# Patient Record
Sex: Female | Born: 1965 | Race: White | Hispanic: No | Marital: Married | Smoking: Never smoker
Health system: Southern US, Community
[De-identification: ages and names within clinical notes are randomized; demographics above are authoritative.]

## PROBLEM LIST (undated history)

## (undated) DIAGNOSIS — K219 Gastro-esophageal reflux disease without esophagitis: Secondary | ICD-10-CM

## (undated) DIAGNOSIS — A048 Other specified bacterial intestinal infections: Secondary | ICD-10-CM

## (undated) DIAGNOSIS — C801 Malignant (primary) neoplasm, unspecified: Secondary | ICD-10-CM

## (undated) DIAGNOSIS — E785 Hyperlipidemia, unspecified: Secondary | ICD-10-CM

## (undated) DIAGNOSIS — M1990 Unspecified osteoarthritis, unspecified site: Secondary | ICD-10-CM

## (undated) DIAGNOSIS — E0590 Thyrotoxicosis, unspecified without thyrotoxic crisis or storm: Secondary | ICD-10-CM

## (undated) DIAGNOSIS — D649 Anemia, unspecified: Secondary | ICD-10-CM

## (undated) DIAGNOSIS — F419 Anxiety disorder, unspecified: Secondary | ICD-10-CM

## (undated) DIAGNOSIS — N96 Recurrent pregnancy loss: Secondary | ICD-10-CM

## (undated) DIAGNOSIS — Z9289 Personal history of other medical treatment: Secondary | ICD-10-CM

## (undated) HISTORY — DX: Unspecified osteoarthritis, unspecified site: M19.90

## (undated) HISTORY — DX: Thyrotoxicosis, unspecified without thyrotoxic crisis or storm: E05.90

## (undated) HISTORY — DX: Personal history of other medical treatment: Z92.89

## (undated) HISTORY — DX: Recurrent pregnancy loss: N96

## (undated) HISTORY — DX: Hyperlipidemia, unspecified: E78.5

## (undated) HISTORY — DX: Gastro-esophageal reflux disease without esophagitis: K21.9

## (undated) HISTORY — DX: Other specified bacterial intestinal infections: A04.8

## (undated) HISTORY — DX: Anxiety disorder, unspecified: F41.9

## (undated) HISTORY — PX: ESSURE TUBAL LIGATION: SUR464

---

## 2003-11-21 ENCOUNTER — Encounter (INDEPENDENT_AMBULATORY_CARE_PROVIDER_SITE_OTHER): Payer: Self-pay | Admitting: Specialist

## 2003-11-21 ENCOUNTER — Ambulatory Visit (HOSPITAL_COMMUNITY): Admission: RE | Admit: 2003-11-21 | Discharge: 2003-11-21 | Payer: Self-pay | Admitting: Obstetrics and Gynecology

## 2004-01-16 ENCOUNTER — Ambulatory Visit (HOSPITAL_COMMUNITY): Admission: RE | Admit: 2004-01-16 | Discharge: 2004-01-16 | Payer: Self-pay | Admitting: Obstetrics and Gynecology

## 2005-04-16 ENCOUNTER — Ambulatory Visit (HOSPITAL_BASED_OUTPATIENT_CLINIC_OR_DEPARTMENT_OTHER): Admission: RE | Admit: 2005-04-16 | Discharge: 2005-04-16 | Payer: Self-pay | Admitting: *Deleted

## 2005-04-16 ENCOUNTER — Encounter (INDEPENDENT_AMBULATORY_CARE_PROVIDER_SITE_OTHER): Payer: Self-pay | Admitting: *Deleted

## 2005-04-16 ENCOUNTER — Ambulatory Visit (HOSPITAL_COMMUNITY): Admission: RE | Admit: 2005-04-16 | Discharge: 2005-04-16 | Payer: Self-pay | Admitting: *Deleted

## 2005-08-13 ENCOUNTER — Ambulatory Visit: Payer: Self-pay | Admitting: Internal Medicine

## 2006-08-17 ENCOUNTER — Inpatient Hospital Stay (HOSPITAL_COMMUNITY): Admission: AD | Admit: 2006-08-17 | Discharge: 2006-08-19 | Payer: Self-pay | Admitting: *Deleted

## 2006-08-20 ENCOUNTER — Emergency Department (HOSPITAL_COMMUNITY): Admission: EM | Admit: 2006-08-20 | Discharge: 2006-08-21 | Payer: Self-pay | Admitting: Emergency Medicine

## 2006-11-17 ENCOUNTER — Ambulatory Visit (HOSPITAL_COMMUNITY): Admission: RE | Admit: 2006-11-17 | Discharge: 2006-11-17 | Payer: Self-pay | Admitting: *Deleted

## 2007-01-26 ENCOUNTER — Ambulatory Visit: Payer: Self-pay | Admitting: Internal Medicine

## 2007-03-17 ENCOUNTER — Ambulatory Visit (HOSPITAL_COMMUNITY): Admission: RE | Admit: 2007-03-17 | Discharge: 2007-03-17 | Payer: Self-pay | Admitting: *Deleted

## 2008-09-24 ENCOUNTER — Ambulatory Visit: Payer: Self-pay | Admitting: Internal Medicine

## 2008-09-24 DIAGNOSIS — J209 Acute bronchitis, unspecified: Secondary | ICD-10-CM

## 2008-09-24 DIAGNOSIS — R05 Cough: Secondary | ICD-10-CM | POA: Insufficient documentation

## 2008-09-25 ENCOUNTER — Encounter: Payer: Self-pay | Admitting: Internal Medicine

## 2008-10-08 ENCOUNTER — Ambulatory Visit: Payer: Self-pay | Admitting: Internal Medicine

## 2008-10-08 DIAGNOSIS — J309 Allergic rhinitis, unspecified: Secondary | ICD-10-CM | POA: Insufficient documentation

## 2008-10-08 DIAGNOSIS — R930 Abnormal findings on diagnostic imaging of skull and head, not elsewhere classified: Secondary | ICD-10-CM

## 2008-12-04 ENCOUNTER — Telehealth: Payer: Self-pay | Admitting: Internal Medicine

## 2008-12-05 ENCOUNTER — Ambulatory Visit: Payer: Self-pay | Admitting: Internal Medicine

## 2008-12-05 DIAGNOSIS — H109 Unspecified conjunctivitis: Secondary | ICD-10-CM | POA: Insufficient documentation

## 2009-03-14 ENCOUNTER — Ambulatory Visit: Payer: Self-pay | Admitting: Internal Medicine

## 2009-03-14 DIAGNOSIS — R1310 Dysphagia, unspecified: Secondary | ICD-10-CM | POA: Insufficient documentation

## 2009-03-14 DIAGNOSIS — R070 Pain in throat: Secondary | ICD-10-CM | POA: Insufficient documentation

## 2009-03-19 ENCOUNTER — Encounter: Payer: Self-pay | Admitting: Internal Medicine

## 2009-06-23 ENCOUNTER — Encounter: Payer: Self-pay | Admitting: Internal Medicine

## 2009-06-27 ENCOUNTER — Encounter: Admission: RE | Admit: 2009-06-27 | Discharge: 2009-06-27 | Payer: Self-pay | Admitting: Otolaryngology

## 2010-04-17 ENCOUNTER — Ambulatory Visit: Payer: Self-pay | Admitting: Internal Medicine

## 2010-04-17 DIAGNOSIS — M25519 Pain in unspecified shoulder: Secondary | ICD-10-CM

## 2010-04-17 DIAGNOSIS — R1013 Epigastric pain: Secondary | ICD-10-CM

## 2010-04-17 DIAGNOSIS — F419 Anxiety disorder, unspecified: Secondary | ICD-10-CM

## 2010-04-17 LAB — CONVERTED CEMR LAB
AST: 18 units/L (ref 0–37)
Albumin: 4.4 g/dL (ref 3.5–5.2)
Alkaline Phosphatase: 47 units/L (ref 39–117)
BUN: 11 mg/dL (ref 6–23)
Basophils Absolute: 0 10*3/uL (ref 0.0–0.1)
CO2: 30 meq/L (ref 19–32)
Calcium: 9.6 mg/dL (ref 8.4–10.5)
Creatinine, Ser: 0.5 mg/dL (ref 0.4–1.2)
Eosinophils Absolute: 0 10*3/uL (ref 0.0–0.7)
GFR calc non Af Amer: 132.46 mL/min (ref >60)
Glucose, Bld: 98 mg/dL (ref 70–99)
Leukocytes, UA: NEGATIVE
Lymphocytes Relative: 23.5 % (ref 12.0–46.0)
MCHC: 34.2 g/dL (ref 30.0–36.0)
Monocytes Relative: 8.3 % (ref 3.0–12.0)
Neutro Abs: 4.1 10*3/uL (ref 1.4–7.7)
Neutrophils Relative %: 67.2 % (ref 43.0–77.0)
Platelets: 270 10*3/uL (ref 150.0–400.0)
RDW: 14 % (ref 11.5–14.6)
Sodium: 142 meq/L (ref 135–145)
Specific Gravity, Urine: 1.025 (ref 1.000–1.030)
Total Bilirubin: 0.7 mg/dL (ref 0.3–1.2)
Total Protein, Urine: NEGATIVE mg/dL
Urine Glucose: NEGATIVE mg/dL

## 2010-09-23 ENCOUNTER — Ambulatory Visit: Payer: Self-pay | Admitting: Internal Medicine

## 2010-12-22 NOTE — Assessment & Plan Note (Signed)
Summary: LEFT SHOULDER PAIN-LB   Vital Signs:  Patient profile:   45 year old female Height:      66 inches Weight:      128.13 pounds BMI:     20.76 O2 Sat:      96 % on Room air Temp:     98.4 degrees F oral Pulse rate:   86 / minute Pulse rhythm:   regular BP sitting:   110 / 72  (left arm) Cuff size:   regular  Vitals Entered By: Rock Nephew CMA (Apr 17, 2010 10:59 AM)  O2 Flow:  Room air  History of Present Illness: C/o L shoulder pain x 2 wks C/o pain in L chest/abd, sharp and intermittent and not related to meals x wks; no n/v C/o anxiety and occasional. 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  The patient denies fever and dyspnea on exertion.    Physical Exam  General:  Well-developed,well-nourished,in no acute distress; alert,appropriate and cooperative throughout examination Mouth:  good dentition, no gingival abnormalities, pharynx pink and moist, no erythema, no exudates, no postnasal drip, no lesions, no leukoplakia, and tonsil hypertropied.  Visible epiglotis Neck:  No deformities, masses, or tenderness noted. Lungs:  Normal respiratory effort, chest expands symmetrically. Lungs are clear to auscultation, no crackles or wheezes. Heart:  Normal rate and regular rhythm. S1 and S2 normal without gallop, murmur, click, rub or other extra sounds. Abdomen:  Bowel sounds positive,abdomen soft and non-tender without masses, organomegaly or hernias noted. Msk:  L subacromial bursa is tender Pulses:  R and L carotid,radial,femoral,dorsalis pedis and posterior tibial pulses are full and equal bilaterally Extremities:  No clubbing, cyanosis, edema, or deformity  noted with normal full range of motion of all joints.   Neurologic:  No cranial nerve deficits noted. Station and gait are normal. Plantar reflexes are down-going bilaterally. DTRs are symmetrical throughout. Sensory, motor and coordinative functions appear intact. Skin:  Intact without suspicious lesions or rashes   Impression & Recommendations:  Problem # 1:  ABDOMINAL PAIN, EPIGASTRIC (ICD-789.06) Assessment New Poss GERD. Start Zantac Orders: Radiology Referral (Radiology) TLB-BMP (Basic Metabolic Panel-BMET) (80048-METABOL) TLB-CBC Platelet - w/Differential (85025-CBCD) TLB-Hepatic/Liver Function Pnl (80076-HEPATIC) TLB-Udip ONLY (81003-UDIP) TLB-H. Pylori Abs(Helicobacter Pylori) (86677-HELICO) TLB-Sedimentation Rate (ESR) (85652-ESR)  Problem # 2:  SHOULDER PAIN (ICD-719.41) L - MSK Assessment: [place] Her updated medication list for this problem includes:    Naproxen 500 Mg Tabs (Naproxen) .Marland Kitchen... 1 by mouth two times a day pc 2 wks for pain/arthritis then as needed - with caution  Problem # 3:  ANXIETY (ICD-300.00) Assessment: Comment Only  Her updated medication list for this problem includes:    Lorazepam 0.5 Mg Tabs (Lorazepam) .Marland Kitchen... 1 by mouth two times a day as needed anxiety  Complete Medication List: 1)  Vitamin D3 1000 Unit Tabs (Cholecalciferol) .Marland Kitchen.. 1 by mouth daily 2)  Naproxen 500 Mg Tabs (Naproxen) .Marland Kitchen.. 1 by mouth two times a day pc 2 wks for pain/arthritis then prn 3)  Ranitidine Hcl 150 Mg Tabs (Ranitidine hcl) .Marland Kitchen.. 1 by mouth two times a day for indigestion 4)  Lorazepam 0.5 Mg Tabs (Lorazepam) .Marland Kitchen.. 1 by mouth two times a day  as needed anxiety  Patient Instructions: 1)  Please schedule a follow-up appointment in 6 weeks. Prescriptions: LORAZEPAM 0.5 MG TABS (LORAZEPAM) 1 by mouth two times a day as needed anxiety  #60 x 1   Entered and Authorized by:   Tresa Garter MD   Signed by:   Tresa Garter MD on 04/17/2010   Method used:    Print then Give to Patient   RxID:   [id number] RANITIDINE HCL 150 MG TABS (RANITIDINE HCL) 1 by mouth two times a day for indigestion  #60 x 12   Entered and Authorized by:   Tresa Garter MD   Signed by:   Tresa Garter MD on 04/17/2010   Method used:   Print then Give to Patient   RxID:   [id number] NAPROXEN 500 MG TABS (NAPROXEN) 1 by mouth two times a day pc 2 wks for pain/arthritis then prn  #60 x 3   Entered and Authorized by:   Tresa Garter MD   Signed by:   Tresa Garter MD on 04/17/2010   Method used:   Print then Give to Patient   RxID:   [id number]

## 2010-12-22 NOTE — Assessment & Plan Note (Signed)
Summary: sore throat/cough/cd   Vital Signs:  Patient profile:   45 year old female Height:      66 inches Weight:      127 pounds BMI:     20.57 Temp:     99.0 degrees F oral Pulse rate:   102 / minute Pulse rhythm:   regular Resp:     16 per minute BP sitting:   120 / 80  (left arm) Cuff size:   regular  Vitals Entered By: Lanier Prude, CMA(AAMA) (September 23, 2010 2:53 PM) CC: runny nose, cough, sore throat X 3-4 days Is Patient Diabetic? No Comments pt state she is curretly not taking any meds   CC:  runny nose, cough, and sore throat X 3-4 days.  History of Present Illness: The patient presents with complaints of sore throat, fever, cough, sinus congestion and drainge of several days duration. Not better with OTC meds. Chest hurts with coughing. Can't sleep due to cough. Muscle aches are present.  The mucus is colored.   Current Medications (verified): 1)  Vitamin D3 1000 Unit  Tabs (Cholecalciferol) .Marland Kitchen.. 1 By Mouth Daily 2)  Naproxen 500 Mg Tabs (Naproxen) .Marland Kitchen.. 1 By Mouth Two Times A Day Pc 2 Wks For Pain/arthritis Then Prn 3)  Ranitidine Hcl 150 Mg Tabs (Ranitidine Hcl) .Marland Kitchen.. 1 By Mouth Two Times A Day For Indigestion 4)  Lorazepam 0.5 Mg Tabs (Lorazepam) .Marland Kitchen.. 1 By Mouth Two Times A Day As Needed Anxiety  Allergies (verified): No Known Drug Allergies  Past History:  Past Medical History: Last updated: 04/17/2010 H/o positive ppd (chornic)  Allergic rhinitis Anxiety  Social History: Last updated: 04/17/2010 Occupation: Dietary Production designer, theatre/television/film at a NH Single Never Smoked Alcohol use-no Drug use-no Regular exercise-yes  Physical Exam  General:  Well-developed,well-nourished,in no acute distress; alert,appropriate and cooperative throughout examination Mouth:  Erythematous throat and intranasal mucosa c/w URI  Lungs:  Normal respiratory effort, chest expands symmetrically. Lungs are clear to auscultation, no crackles or wheezes. Heart:  Normal rate and regular  rhythm. S1 and S2 normal without gallop, murmur, click, rub or other extra sounds. Abdomen:  Bowel sounds positive,abdomen soft and non-tender without masses, organomegaly or hernias noted.   Impression & Recommendations:  Problem # 1:  ACUTE BRONCHITIS (ICD-466.0) Assessment New  Her updated medication list for this problem includes:    Zithromax Z-pak 250 Mg Tabs (Azithromycin) .Marland Kitchen... As dirrected    Tussicaps 10-8 Mg Xr12h-cap (Hydrocod polst-chlorphen polst) .Marland Kitchen... 1 by mouth two times a day as needed cough  Complete Medication List: 1)  Vitamin D3 1000 Unit Tabs (Cholecalciferol) .Marland Kitchen.. 1 by mouth daily 2)  Naproxen 500 Mg Tabs (Naproxen) .Marland Kitchen.. 1 by mouth two times a day pc 2 wks for pain/arthritis then prn 3)  Ranitidine Hcl 150 Mg Tabs (Ranitidine hcl) .Marland Kitchen.. 1 by mouth two times a day for indigestion 4)  Lorazepam 0.5 Mg Tabs (Lorazepam) .Marland Kitchen.. 1 by mouth two times a day as needed anxiety 5)  Zithromax Z-pak 250 Mg Tabs (Azithromycin) .... As dirrected 6)  Tussicaps 10-8 Mg Xr12h-cap (Hydrocod polst-chlorphen polst) .Marland Kitchen.. 1 by mouth two times a day as needed cough  Patient Instructions: 1)  Use over-the-counter medicines for "cold": Tylenol  650mg  or Advil 400mg  every 6 hours  for fever; Delsym or Robutussin for cough. Mucinex or Mucinex D for congestion. Ricola or Halls for sore throat. Office visit if not better or if worse.  Prescriptions: TUSSICAPS 10-8 MG XR12H-CAP (HYDROCOD POLST-CHLORPHEN POLST) 1 by  mouth two times a day as needed cough  #20 x 0   Entered and Authorized by:   Tresa Garter MD   Signed by:   Tresa Garter MD on 09/23/2010   Method used:   Print then Give to Patient   RxID:   [id number] ZITHROMAX Z-PAK 250 MG TABS (AZITHROMYCIN) as dirrected  #1 x 0   Entered and Authorized by:   Tresa Garter MD   Signed by:   Tresa Garter MD on 09/23/2010   Method used:   Print then Give to Patient   RxID:   [id number]    Orders  Added: 1)  Est. Patient Level III [95284]

## 2011-04-09 NOTE — Op Note (Signed)
Priscilla Hill, Priscilla Hill                 ACCOUNT NO.:  [id number]   MEDICAL RECORD NO.:  [id number]          PATIENT TYPE:  AMB   LOCATION:  [facility]                           FACILITY:  [facility]   PHYSICIAN:  [place] B. Earlene Plater, M.D.  DATE OF BIRTH:  1966-03-28   DATE OF PROCEDURE:  11/17/2006  DATE OF DISCHARGE:                               OPERATIVE REPORT   PREOPERATIVE DIAGNOSES:  Desires tubal sterilization.   POSTOPERATIVE DIAGNOSES:  Desires tubal sterilization.   OPERATIVE PROCEDURE:  Essure tubal sterilization.   SURGEON:  Chester Holstein. Earlene Plater, M.D.   ASSISTANT:  None.   ANESTHESIA:  Monitored anesthesia care with 20 mL of 1% Nesacaine  paracervical block.   SPECIMENS:  None.   ESTIMATED BLOOD LOSS:  Minimal.   FLUID DEFICIT:  100 mL.   COMPLICATIONS:  None.   FINDINGS:  Normal appearing uterine cavity.  Five coils visible on the  left, six on the right.   INDICATIONS FOR PROCEDURE:  The patient desires permanent tubal  sterilization.  Failure rate and ectopic risks discussed.  Also surgical  risk discussed including infection, bleeding, uterine perforation,  damage to surrounding organs and potential need to convert to  laparoscopic tubal.  The patient was advised of the 5 to 10% non-  visualization or inability to place a coil as well as the FDA mandated  HSG three months postop.  The patient is aware of the need to continue  birth control until that time.   DESCRIPTION OF PROCEDURE:  The patient was taken to the operating room  and MAC anesthesia obtained.  She was placed in the [place] stirrups,  prepped and draped in a standard fashion.  Bladder entered with in and  out catheter.  Examination under anesthesia showed anteverted normal  size uterus without adnexal masses.  Speculum inserted.  Paracervical  block placed.  Single tooth attached to the anterior lip of the cervix.  The Essure scope was inserted after being flushed.  Good uterine  distention obtained.  Good  visualization obtained.  Left tube was  approached first.  The device was inserted to the black depth marker and  deployed in the standard manner.  Five coils were visible post  placement.  Repeated on the right side in the same manner.  Six coils  visible post placement.   Instruments removed and cervix was bleeding from slight laceration from  the single tooth.  This was made hemostatic with a single figure-of-  eight stitch of 2-0 chromic.   The patient tolerated the procedure well, no complications.  She was  taken to the recovery room in stable condition.      Gerri Spore B. Earlene Plater, M.D.  Electronically Signed     WBD/MEDQ  D:  11/17/2006  T:  11/17/2006  Job:  [id number]

## 2011-04-09 NOTE — Op Note (Signed)
Priscilla Hill, Priscilla Hill                 ACCOUNT NO.:  [id number]   MEDICAL RECORD NO.:  [id number]          PATIENT TYPE:  AMB   LOCATION:  [facility]                         FACILITY:  [facility]   PHYSICIAN:  [place] B. Earlene Plater, M.D.  DATE OF BIRTH:  10/20/1966   DATE OF PROCEDURE:  04/16/2005  DATE OF DISCHARGE:                                 OPERATIVE REPORT   PREOPERATIVE DIAGNOSES:  1.  Seven week spontaneous abortion.  2.  Repetitive miscarriage.   POSTOPERATIVE DIAGNOSES:  1.  Seven week spontaneous abortion.  2.  Repetitive miscarriage.   PROCEDURE:  Suction curettage.   SURGEON:  Marina Gravel, M.D.   ASSISTANT:  None.   ANESTHESIA:  MAC and 20 mL 1% lidocaine paracervical block.   SPECIMENS:  Products of conception portion to genetics.   BLOOD LOSS:  Minimal.   COMPLICATIONS:  None.   INDICATION:  The patient with a history of three spontaneous miscarriages.  She has one live birth from several years ago when she lived in [place].  However, with her new partner since that time, this will be her third  miscarriage.  She presents for surgical management and requests portion sent  to genetics.   DESCRIPTION OF PROCEDURE:  The patient taken to the operating room and MAC  anesthesia obtained.  She was placed in [place] stirrups and prepped and  draped in standard fashion.  Bladder emptied with in-and-out cath.  Exam  under anesthesia showed an anteverted, 8 weeks' size uterus, no adnexa.  Masses.   Speculum inserted, paracervical block placed, and a single-tooth attached to  the anterior lip of the cervix.   The cervix was easily dilated to #21.  The #7 curved cannula was inserted  with suction on uterus cleared.  The uterus is curetted and gritty texture  noted throughout.  Suction cannula was re-placed one more time, and no  additional tissue returned.  Therefore, the procedure was terminated, single-  tooth removed, and the cervix hemostatic.   The patient tolerated the  procedure well.  There were no complications.  She  was taken to the recovery room awake, alert, in stable condition.  The  patient is documented to be Rh positive.  She will return to the office for  antiphospholipid antibody work-up.      WBD/MEDQ  D:  04/16/2005  T:  04/16/2005  Job:  [id number]

## 2011-04-09 NOTE — Op Note (Signed)
Priscilla Hill, ATHA NO.:  [id number]   MEDICAL RECORD NO.:  [id number]                   PATIENT TYPE:  AMB   LOCATION:  [facility]C                                  [facility]ACILITY:  [facility]   PHYSICIAN:  Richardean Sale, M.D.                DATE OF BIRTH:  05/15/1966   DATE OF PROCEDURE:  11/21/2003  DATE OF DISCHARGE:                                 OPERATIVE REPORT   PREOPERATIVE DIAGNOSIS:  Submucosal fibroid.   POSTOPERATIVE DIAGNOSIS:  Submucosal fibroid.   PROCEDURE:  Hysteroscopic resection of submucosal fibroid and D&C.   SURGEON:  Richardean Sale, M.D.   ANESTHESIA:  General endotracheal and paracervical block.   ESTIMATED BLOOD LOSS:  Minimal.   FLUID DEFICIT:  100 mL   COMPLICATIONS:  None.   FINDINGS:  A 1 1/2 to 2 cm pedunculated fibroid originating from the  posterior aspect of the lower uterine segment.  Normal appearing cervix,  normal appearing tubal ostia.   INDICATIONS FOR PROCEDURE:  This is a 45 year old, gravida 2, para 1-0-1-1  female who was found to have a submucosal fibroid on ultrasound confirmed  with sonohysterogram.  The patient desires pregnancy in the future and has  difficulty conceiving and she presents for removal of the fibroid.  Prior to  the procedure, the risks of hysteroscopy with resection of fibroid and D&C  were reviewed with the patient and informed consent was obtained.  Specifically we discussed the risks of hemorrhage requiring transfusion,  injury to the uterus requiring further exploratory surgery with possible  injury to the bowel, bladder or other intraabdominal organs,  infection  requiring additional hospitalization and scarring of the uterus, anesthetic  related complications and even death. The patient voiced understanding of  all these risks and agreed to proceed.  All questions were answered and  informed consent was obtained prior to proceeding to the OR.   DESCRIPTION OF PROCEDURE:  The patient was  taken to the operating room where  she was given general anesthesia.  She was then prepped and draped in the  usual sterile fashion with a Hibiclens prep and the bladder was drained with  a red rubber catheter.  Bimanual exam confirmed the presence of the midline  anteverted uterus. A bivalve speculum was then placed in the vagina and the  cervix was visualized. 2-3 mL of 1% Nesacaine were injected at the 12  o'clock position and the anterior lip of the cervix was then grasped with a  single tooth tenaculum.  A paracervical block was then given using 1%  Nesacaine for a total of 22 mL at the 4, 5, 7 and 8 o'clock positions and  over the uterosacral ligaments to facilitate postoperative analgesia. At  this point, the cervix was then dilated with a Pratt dilator up to #31.  The  hysteroscope was then introduced and the uterine fibroid originating off the  posterior aspect of the uterus easily visualized.  I was able to move the  scope pass the fibroid and visualize the two ostia which both appeared  patent and normal. There were no other fibroids noted in the uterine cavity.  The endometrium otherwise appeared normal.  At this point, the resectoscope  was then used to resect the fibroid, this was done without difficulty.  Once  the uterine fibroid was removed in its entirety, the base was then  visualized and there was no evidence of any active bleeding. The uterine  cavity was then visualized and now anatomically appeared normal. The  hysteroscope was then removed and a sharp curettage was performed to remove  any additional fibroid pieces. The hysteroscope was then reintroduced and a  final picture was taken confirming that the fibroid had been removed. There  was no evidence of any perforation during the entire procedure. At this  point, all instruments were then removed from the patient's vagina, there  was no bleeding noted from the tenaculum site on the cervix. The patient was  taken  out of the dorsal lithotomy position and awakened from anesthesia and  taken to the recovery room in good condition. There were no complications.                                               Richardean Sale, M.D.    JW/MEDQ  D:  11/21/2003  T:  11/21/2003  Job:  [id number]

## 2011-04-09 NOTE — H&P (Signed)
NAMESHANDELL, Priscilla NO.:  [id number]   MEDICAL RECORD NO.:  [id number]                   PATIENT TYPE:  AMB   LOCATION:  [facility]                                  FACILITY:  [facility]   PHYSICIAN:  Richardean Sale, M.D.                DATE OF BIRTH:  04/04/1966   DATE OF ADMISSION:  11/21/2003  DATE OF DISCHARGE:                                HISTORY & PHYSICAL   PREOPERATIVE DIAGNOSIS:  Submucosal uterine fibroid.   HISTORY OF PRESENT ILLNESS:  This is a 45 year old gravida 2, para 1-0-1-1  white female who was found to have a submucosal fibroid on ultrasound which  was confirmed on sonohysterogram and measures 1.65 x 1.30 x 1.59 cm on the  ultrasound and almost completely fills the uterine cavity.  She has four  other smaller fibroids, the largest of which is 2 cm in size that are  intramural.  The patient desires pregnancy and has been having unprotected  intercourse with no conception and she presents for hysteroscopy with  resection of uterine fibroid.   PAST MEDICAL HISTORY:  None.   PAST SURGICAL HISTORY:  D&C for abortion and cervical polyp removal.   MEDICATIONS:  None.  No herbal supplements.   ALLERGIES:  The patient states she is allergic to some sort of antibiotic  that was given in [place] and it is not penicillin and not sulfur but she is  uncertain of the name.   SOCIAL HISTORY:  No tobacco, alcohol, or drugs.   FAMILY HISTORY:  Denies any bleeding disorders, early onset heart disease,  or other illness.   PHYSICAL EXAMINATION:  VITAL SIGNS:  Blood pressure 120/70, weight 120,  height 5 feet 5-5/8 inches.  Last menstrual period was November 13, 2003.  GENERAL:  She is a well-developed, well-nourished white female in no  apparent distress.  NECK:  Neck and thyroid are within normal limits.  HEENT:  Normocephalic, atraumatic.  NEUROLOGIC:  Nonfocal.  HEART:  Regular rate and rhythm with no murmurs, rubs, or gallops.  LUNGS:  Clear to  auscultation bilaterally.  ABDOMEN:  Soft, nontender, nondistended with no palpable masses.  PELVIC:  Deferred to the OR.  EXTREMITIES:  No clubbing, cyanosis, edema, nontender.   ASSESSMENT:  A 45 year old gravida 2, para 1-0-1-1 white female with a large  submucosal fibroid who desires pregnancy.   PLAN:  Will proceed with hysteroscopic resection of uterine fibroid.  The  risks of the procedure including, but not limited to, hemorrhage requiring  transfusion, perforation requiring further abdominal surgery with possible  injury to bowel, bladder, other organs, infection requiring additional  hospitalization, and related complications and even death  reviewed with patient and informed consent was obtained.  Recommend we  follow up with a hysterosalpingogram one to two months after her surgery to  evaluate the uterine cavity before she attempts to become pregnant.  We will  have her preoperative visit with anesthesia on November 20, 2003 and surgery  is planned for November 21, 2003.                                               Richardean Sale, M.D.    JW/MEDQ  D:  11/19/2003  T:  11/19/2003  Job:  [id number]

## 2011-05-31 ENCOUNTER — Encounter: Payer: Self-pay | Admitting: Internal Medicine

## 2011-05-31 ENCOUNTER — Ambulatory Visit (INDEPENDENT_AMBULATORY_CARE_PROVIDER_SITE_OTHER): Payer: PRIVATE HEALTH INSURANCE | Admitting: Internal Medicine

## 2011-05-31 DIAGNOSIS — N760 Acute vaginitis: Secondary | ICD-10-CM | POA: Insufficient documentation

## 2011-05-31 DIAGNOSIS — J069 Acute upper respiratory infection, unspecified: Secondary | ICD-10-CM

## 2011-05-31 MED ORDER — PROMETHAZINE-CODEINE 6.25-10 MG/5ML PO SYRP: 5.0000 mL | ORAL_SOLUTION | ORAL | Status: AC | PRN

## 2011-05-31 MED ORDER — NYSTATIN 500000 UNITS PO TABS: 1.0000 | ORAL_TABLET | Freq: Three times a day (TID) | ORAL | Status: DC

## 2011-05-31 MED ORDER — ALIGN 4 MG PO CAPS: 1.0000 | ORAL_CAPSULE | Freq: Every day | ORAL | Status: DC

## 2011-05-31 MED ORDER — AZITHROMYCIN 250 MG PO TABS: 250.0000 mg | ORAL_TABLET | Freq: Every day | ORAL | Status: AC

## 2011-05-31 NOTE — Assessment & Plan Note (Signed)
Given Nystatin if reoccurs Align 1 qd x 2-4 wks

## 2011-05-31 NOTE — Progress Notes (Signed)
  Subjective:    Patient ID: Priscilla Hill, female    DOB: 1966/02/25, 45 y.o.   MRN: [id number]  HPI   HPI  C/o URI sx's x   days. C/o ST, cough, weakness. Not better with OTC medicines. Actually, the patient is getting worse. The patient did not sleep last night due to cough. C/o vag d/c - gyn treated for yeast; recurrent problem - no testss done  Review of Systems  Constitutional: Positive for fever, chills and fatigue.  HENT: Positive for congestion, rhinorrhea, sneezing and postnasal drip.   Eyes: Positive for photophobia and pain. Negative for discharge and visual disturbance.  Respiratory: Positive for cough and wheezing.   Positive for chest pain.  Gastrointestinal: Negative for vomiting, abdominal pain, diarrhea and abdominal distention.  Genitourinary: Negative for dysuria and difficulty urinating.  Skin: Negative for rash.  Neurological: Positive for dizziness, weakness and light-headedness.      Review of Systems     Objective:   Physical Exam  Constitutional: She appears well-developed and well-nourished. No distress.  HENT:  Head: Normocephalic.  Right Ear: External ear normal.  Left Ear: External ear normal.  Nose: Nose normal.  Mouth/Throat: Oropharynx is clear and moist. No oropharyngeal exudate.       eryth mucosa  Eyes: Conjunctivae are normal. Pupils are equal, round, and reactive to light. Right eye exhibits no discharge. Left eye exhibits no discharge.  Neck: Normal range of motion. Neck supple. No JVD present. No tracheal deviation present. No thyromegaly present.  Cardiovascular: Normal rate, regular rhythm and normal heart sounds.   Pulmonary/Chest: No stridor. No respiratory distress. She has no wheezes.  Abdominal: Soft. Bowel sounds are normal. She exhibits no distension and no mass. There is no tenderness. There is no rebound and no guarding.  Musculoskeletal: She exhibits no edema and no tenderness.  Lymphadenopathy:    She has no cervical  adenopathy.  Neurological: She displays normal reflexes. No cranial nerve deficit. She exhibits normal muscle tone. Coordination normal.  Skin: No rash noted. No erythema.  Psychiatric: She has a normal mood and affect. Her behavior is normal. Judgment and thought content normal.          Assessment & Plan:

## 2011-05-31 NOTE — Assessment & Plan Note (Signed)
Start Z pac Prom-cod syr prn

## 2011-09-15 ENCOUNTER — Ambulatory Visit (INDEPENDENT_AMBULATORY_CARE_PROVIDER_SITE_OTHER): Payer: PRIVATE HEALTH INSURANCE | Admitting: Internal Medicine

## 2011-09-15 ENCOUNTER — Encounter: Payer: Self-pay | Admitting: Internal Medicine

## 2011-09-15 VITALS — BP 108/68 | HR 75 | Temp 97.8°F | Resp 16 | Wt 128.0 lb

## 2011-09-15 DIAGNOSIS — J209 Acute bronchitis, unspecified: Secondary | ICD-10-CM

## 2011-09-15 MED ORDER — AZITHROMYCIN 500 MG PO TABS: 500.0000 mg | ORAL_TABLET | Freq: Every day | ORAL | Status: AC

## 2011-09-15 MED ORDER — HYDROCOD POLST-CPM POLST ER 10-8 MG PO CP12: 1.0000 | ORAL_CAPSULE | Freq: Two times a day (BID) | ORAL | Status: DC | PRN

## 2011-09-15 NOTE — Progress Notes (Signed)
  Subjective:    Patient ID: Priscilla Hill, female    DOB: December 31, 1965, 45 y.o.   MRN: [id number]  URI  This is a new problem. The current episode started 1 to 4 weeks ago. The problem has been gradually worsening. There has been no fever. Associated symptoms include congestion, coughing (productive of yellow phlegm) and a sore throat. Pertinent negatives include no abdominal pain, chest pain, diarrhea, dysuria, ear pain, headaches, joint pain, joint swelling, nausea, neck pain, plugged ear sensation, rash, rhinorrhea, sinus pain, sneezing, swollen glands, vomiting or wheezing. She has tried nothing for the symptoms.      Review of Systems  Constitutional: Negative for fever, chills, diaphoresis, activity change, appetite change, fatigue and unexpected weight change.  HENT: Positive for congestion and sore throat. Negative for ear pain, facial swelling, rhinorrhea, sneezing, trouble swallowing, neck pain, neck stiffness, voice change, postnasal drip, sinus pressure and ear discharge.   Eyes: Negative.   Respiratory: Positive for cough (productive of yellow phlegm). Negative for apnea, choking, chest tightness, shortness of breath, wheezing and stridor.   Cardiovascular: Negative for chest pain, palpitations and leg swelling.  Gastrointestinal: Negative for nausea, vomiting, abdominal pain and diarrhea.  Genitourinary: Negative for dysuria, enuresis, difficulty urinating and dyspareunia.  Musculoskeletal: Negative.  Negative for joint pain.  Skin: Negative for color change, pallor, rash and wound.  Neurological: Negative for dizziness, tremors, seizures, syncope, facial asymmetry, speech difficulty, weakness, light-headedness, numbness and headaches.  Hematological: Negative for adenopathy. Does not bruise/bleed easily.  Psychiatric/Behavioral: Negative.        Objective:   Physical Exam  Vitals reviewed. Constitutional: She is oriented to person, place, and time. She appears well-developed  and well-nourished. No distress.  HENT:  Head: Normocephalic and atraumatic.  Mouth/Throat: Oropharynx is clear and moist. No oropharyngeal exudate.  Eyes: Conjunctivae are normal. Right eye exhibits no discharge. Left eye exhibits no discharge. No scleral icterus.  Neck: Normal range of motion. Neck supple. No JVD present. No tracheal deviation present. No thyromegaly present.  Cardiovascular: Normal rate, regular rhythm, normal heart sounds and intact distal pulses.  Exam reveals no gallop and no friction rub.   No murmur heard. Pulmonary/Chest: Effort normal and breath sounds normal. No stridor. No respiratory distress. She has no wheezes. She has no rales. She exhibits no tenderness.  Abdominal: Soft. Bowel sounds are normal. She exhibits no distension and no mass. There is no tenderness. There is no rebound and no guarding.  Musculoskeletal: Normal range of motion. She exhibits no edema and no tenderness.  Lymphadenopathy:    She has no cervical adenopathy.  Neurological: She is oriented to person, place, and time. She displays normal reflexes. No cranial nerve deficit. She exhibits normal muscle tone. Coordination normal.  Skin: Skin is warm and dry. No rash noted. She is not diaphoretic. No erythema. No pallor.  Psychiatric: She has a normal mood and affect. Her behavior is normal. Judgment and thought content normal.          Assessment & Plan:

## 2011-09-15 NOTE — Patient Instructions (Signed)

## 2011-09-15 NOTE — Assessment & Plan Note (Signed)
Start tussicaps and zpak

## 2012-11-09 ENCOUNTER — Ambulatory Visit: Payer: PRIVATE HEALTH INSURANCE | Admitting: Internal Medicine

## 2012-11-14 ENCOUNTER — Ambulatory Visit (INDEPENDENT_AMBULATORY_CARE_PROVIDER_SITE_OTHER): Payer: PRIVATE HEALTH INSURANCE | Admitting: Internal Medicine

## 2012-11-14 ENCOUNTER — Other Ambulatory Visit (INDEPENDENT_AMBULATORY_CARE_PROVIDER_SITE_OTHER): Payer: PRIVATE HEALTH INSURANCE

## 2012-11-14 ENCOUNTER — Encounter: Payer: Self-pay | Admitting: Internal Medicine

## 2012-11-14 VITALS — BP 120/70 | HR 80 | Temp 99.6°F | Resp 16 | Ht 66.0 in | Wt 127.0 lb

## 2012-11-14 DIAGNOSIS — J209 Acute bronchitis, unspecified: Secondary | ICD-10-CM

## 2012-11-14 DIAGNOSIS — R209 Unspecified disturbances of skin sensation: Secondary | ICD-10-CM

## 2012-11-14 DIAGNOSIS — K921 Melena: Secondary | ICD-10-CM

## 2012-11-14 DIAGNOSIS — Z0000 Encounter for general adult medical examination without abnormal findings: Secondary | ICD-10-CM

## 2012-11-14 DIAGNOSIS — R202 Paresthesia of skin: Secondary | ICD-10-CM | POA: Insufficient documentation

## 2012-11-14 DIAGNOSIS — E785 Hyperlipidemia, unspecified: Secondary | ICD-10-CM

## 2012-11-14 LAB — CBC WITH DIFFERENTIAL/PLATELET
Basophils Relative: 0.2 % (ref 0.0–3.0)
Eosinophils Absolute: 0.1 10*3/uL (ref 0.0–0.7)
HCT: 36.3 % (ref 36.0–46.0)
Hemoglobin: 12.1 g/dL (ref 12.0–15.0)
Lymphs Abs: 1.8 10*3/uL (ref 0.7–4.0)
MCHC: 33.3 g/dL (ref 30.0–36.0)
MCV: 87.6 fl (ref 78.0–100.0)
Monocytes Absolute: 0.4 10*3/uL (ref 0.1–1.0)
Neutro Abs: 3.6 10*3/uL (ref 1.4–7.7)
Neutrophils Relative %: 60.8 % (ref 43.0–77.0)
RBC: 4.14 Mil/uL (ref 3.87–5.11)

## 2012-11-14 LAB — URINALYSIS
Ketones, ur: NEGATIVE
Specific Gravity, Urine: 1.015 (ref 1.000–1.030)
Urine Glucose: NEGATIVE
pH: 8.5 (ref 5.0–8.0)

## 2012-11-14 LAB — HEPATIC FUNCTION PANEL
AST: 19 U/L (ref 0–37)
Alkaline Phosphatase: 47 U/L (ref 39–117)
Bilirubin, Direct: 0.1 mg/dL (ref 0.0–0.3)
Total Bilirubin: 0.9 mg/dL (ref 0.3–1.2)

## 2012-11-14 LAB — BASIC METABOLIC PANEL
CO2: 29 mEq/L (ref 19–32)
Chloride: 104 mEq/L (ref 96–112)
Creatinine, Ser: 0.6 mg/dL (ref 0.4–1.2)
Potassium: 3.5 mEq/L (ref 3.5–5.1)

## 2012-11-14 LAB — LIPID PANEL
LDL Cholesterol: 124 mg/dL — ABNORMAL HIGH (ref 0–99)
Total CHOL/HDL Ratio: 4

## 2012-11-14 LAB — VITAMIN B12: Vitamin B-12: 695 pg/mL (ref 211–911)

## 2012-11-14 MED ORDER — VITAMIN D 1000 UNITS PO TABS: 1000.0000 [IU] | ORAL_TABLET | Freq: Every day | ORAL | Status: AC

## 2012-11-14 MED ORDER — HYDROCOD POLST-CPM POLST ER 10-8 MG PO CP12: 1.0000 | ORAL_CAPSULE | Freq: Two times a day (BID) | ORAL | Status: DC | PRN

## 2012-11-14 NOTE — Assessment & Plan Note (Signed)
Diet: low fat

## 2012-11-14 NOTE — Assessment & Plan Note (Signed)
Tussiocaps prn

## 2012-11-14 NOTE — Assessment & Plan Note (Signed)
Colon - Dr Juanda Chance

## 2012-11-14 NOTE — Assessment & Plan Note (Signed)
B 12

## 2012-11-14 NOTE — Assessment & Plan Note (Addendum)
We discussed age appropriate health related issues, including available/recomended screening tests and vaccinations. We discussed a need for adhering to healthy diet and exercise. Labs/EKG were reviewed/ordered. All questions were answered. Labs. Declined vaccines

## 2012-11-14 NOTE — Progress Notes (Signed)
  Subjective:    Patient ID: Priscilla Hill, female    DOB: July 12, 1966, 46 y.o.   MRN: [id number]  HPI  The patient is here for a wellness exam. The patient has been doing well overall without major physical or psychological issues going on lately. C/o blood in stool x 1-2 years. C/o URI and cough x 2 wks.  Review of Systems  Constitutional: Negative for fever, chills, diaphoresis, activity change, appetite change, fatigue and unexpected weight change.  HENT: Negative for hearing loss, ear pain, congestion, sore throat, sneezing, mouth sores, neck pain, dental problem, voice change, postnasal drip and sinus pressure.   Eyes: Negative for pain and visual disturbance.  Respiratory: Negative for cough, chest tightness, wheezing and stridor.   Cardiovascular: Negative for chest pain, palpitations and leg swelling.  Gastrointestinal: Positive for blood in stool. Negative for nausea, vomiting, abdominal pain, abdominal distention and rectal pain.  Genitourinary: Negative for dysuria, hematuria, decreased urine volume, vaginal bleeding, vaginal discharge, difficulty urinating, vaginal pain and menstrual problem.  Musculoskeletal: Negative for back pain, joint swelling and gait problem.  Skin: Negative for color change, rash and wound.  Neurological: Negative for dizziness, tremors, syncope, speech difficulty, weakness and light-headedness.  Hematological: Negative for adenopathy.  Psychiatric/Behavioral: Negative for suicidal ideas, hallucinations, behavioral problems, confusion, sleep disturbance, dysphoric mood and decreased concentration. The patient is not hyperactive.    BP 120/70  Pulse 80  Temp 99.6 F (37.6 C) (Oral)  Resp 16  Ht 5\' 6"  (1.676 m)  Wt 127 lb (57.607 kg)  BMI 20.50 kg/m2     Objective:   Physical Exam  Constitutional: She appears well-developed. No distress.  HENT:  Head: Normocephalic.  Right Ear: External ear normal.  Left Ear: External ear normal.  Nose: Nose  normal.  Mouth/Throat: Oropharynx is clear and moist.  Eyes: Conjunctivae normal are normal. Pupils are equal, round, and reactive to light. Right eye exhibits no discharge. Left eye exhibits no discharge.  Neck: Normal range of motion. Neck supple. No JVD present. No tracheal deviation present. No thyromegaly present.  Cardiovascular: Normal rate, regular rhythm and normal heart sounds.   Pulmonary/Chest: No stridor. No respiratory distress. She has no wheezes.  Abdominal: Soft. Bowel sounds are normal. She exhibits no distension and no mass. There is no tenderness. There is no rebound and no guarding.  Musculoskeletal: She exhibits no edema and no tenderness.  Lymphadenopathy:    She has no cervical adenopathy.  Neurological: She displays normal reflexes. No cranial nerve deficit. She exhibits normal muscle tone. Coordination normal.  Skin: No rash noted. No erythema.  Psychiatric: She has a normal mood and affect. Her behavior is normal. Judgment and thought content normal.          Assessment & Plan:

## 2012-11-20 ENCOUNTER — Encounter: Payer: Self-pay | Admitting: Internal Medicine

## 2012-11-24 ENCOUNTER — Encounter: Payer: Self-pay | Admitting: *Deleted

## 2012-12-27 ENCOUNTER — Ambulatory Visit: Payer: PRIVATE HEALTH INSURANCE | Admitting: Internal Medicine

## 2013-02-16 ENCOUNTER — Ambulatory Visit: Payer: PRIVATE HEALTH INSURANCE | Admitting: Internal Medicine

## 2013-02-23 ENCOUNTER — Ambulatory Visit: Payer: PRIVATE HEALTH INSURANCE | Admitting: Internal Medicine

## 2013-02-28 ENCOUNTER — Encounter: Payer: Self-pay | Admitting: Internal Medicine

## 2013-02-28 ENCOUNTER — Ambulatory Visit (INDEPENDENT_AMBULATORY_CARE_PROVIDER_SITE_OTHER): Payer: PRIVATE HEALTH INSURANCE | Admitting: Internal Medicine

## 2013-02-28 VITALS — BP 128/72 | HR 68 | Temp 98.2°F | Resp 16 | Wt 130.0 lb

## 2013-02-28 DIAGNOSIS — D6859 Other primary thrombophilia: Secondary | ICD-10-CM

## 2013-02-28 DIAGNOSIS — M19049 Primary osteoarthritis, unspecified hand: Secondary | ICD-10-CM | POA: Insufficient documentation

## 2013-02-28 DIAGNOSIS — D6861 Antiphospholipid syndrome: Secondary | ICD-10-CM | POA: Insufficient documentation

## 2013-02-28 MED ORDER — DICLOFENAC SODIUM 2 % TD SOLN: 1.0000 | Freq: Three times a day (TID) | TRANSDERMAL | Status: DC | PRN

## 2013-02-28 NOTE — Progress Notes (Signed)
   Subjective:    HPI  The patient is here for a wellness exam.  The patient has been doing well overall without major physical or psychological issues going on lately. C/o blood in stool x 1-2 years. C/o URI and cough x 2 wks.  Review of Systems  Constitutional: Negative for fever, chills, diaphoresis, activity change, appetite change, fatigue and unexpected weight change.  HENT: Negative for hearing loss, ear pain, congestion, sore throat, sneezing, mouth sores, neck pain, dental problem, voice change, postnasal drip and sinus pressure.   Eyes: Negative for pain and visual disturbance.  Respiratory: Negative for cough, chest tightness, wheezing and stridor.   Cardiovascular: Negative for chest pain, palpitations and leg swelling.  Gastrointestinal: Positive for blood in stool. Negative for nausea, vomiting, abdominal pain, abdominal distention and rectal pain.  Genitourinary: Negative for dysuria, hematuria, decreased urine volume, vaginal bleeding, vaginal discharge, difficulty urinating, vaginal pain and menstrual problem.  Musculoskeletal: Negative for back pain, joint swelling and gait problem.  Skin: Negative for color change, rash and wound.  Neurological: Negative for dizziness, tremors, syncope, speech difficulty, weakness and light-headedness.  Hematological: Negative for adenopathy.  Psychiatric/Behavioral: Negative for suicidal ideas, hallucinations, behavioral problems, confusion, sleep disturbance, dysphoric mood and decreased concentration. The patient is not hyperactive.    BP 128/72  Pulse 68  Temp(Src) 98.2 F (36.8 C) (Oral)  Resp 16  Wt 130 lb (58.968 kg)  BMI 20.99 kg/m2     Objective:   Physical Exam  Constitutional: She appears well-developed. No distress.  HENT:  Head: Normocephalic.  Right Ear: External ear normal.  Left Ear: External ear normal.  Nose: Nose normal.  Mouth/Throat: Oropharynx is clear and moist.  Eyes: Conjunctivae are normal. Pupils  are equal, round, and reactive to light. Right eye exhibits no discharge. Left eye exhibits no discharge.  Neck: Normal range of motion. Neck supple. No JVD present. No tracheal deviation present. No thyromegaly present.  Cardiovascular: Normal rate, regular rhythm and normal heart sounds.   Pulmonary/Chest: No stridor. No respiratory distress. She has no wheezes.  Abdominal: Soft. Bowel sounds are normal. She exhibits no distension and no mass. There is no tenderness. There is no rebound and no guarding.  Musculoskeletal: She exhibits no edema and no tenderness.  Lymphadenopathy:    She has no cervical adenopathy.  Neurological: She displays normal reflexes. No cranial nerve deficit. She exhibits normal muscle tone. Coordination normal.  Skin: No rash noted. No erythema.  Psychiatric: She has a normal mood and affect. Her behavior is normal. Judgment and thought content normal.   Lab Results  Component Value Date   WBC 5.9 11/14/2012   HGB 12.1 11/14/2012   HCT 36.3 11/14/2012   PLT 274.0 11/14/2012   GLUCOSE 98 11/14/2012   CHOL 186 11/14/2012   TRIG 71.0 11/14/2012   HDL 47.70 11/14/2012   LDLCALC 124* 11/14/2012   ALT 18 11/14/2012   AST 19 11/14/2012   NA 138 11/14/2012   K 3.5 11/14/2012   CL 104 11/14/2012   CREATININE 0.6 11/14/2012   BUN 7 11/14/2012   CO2 29 11/14/2012   TSH 2.60 11/14/2012          Assessment & Plan:

## 2013-02-28 NOTE — Patient Instructions (Signed)
Asper cream Ben Gay cream

## 2013-02-28 NOTE — Assessment & Plan Note (Addendum)
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

## 2013-02-28 NOTE — Assessment & Plan Note (Addendum)
???   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

## 2013-03-23 ENCOUNTER — Other Ambulatory Visit (INDEPENDENT_AMBULATORY_CARE_PROVIDER_SITE_OTHER): Payer: PRIVATE HEALTH INSURANCE

## 2013-03-23 ENCOUNTER — Ambulatory Visit (INDEPENDENT_AMBULATORY_CARE_PROVIDER_SITE_OTHER): Payer: Self-pay | Admitting: Internal Medicine

## 2013-03-23 ENCOUNTER — Encounter: Payer: Self-pay | Admitting: Internal Medicine

## 2013-03-23 VITALS — BP 102/70 | HR 81 | Ht 65.0 in | Wt 128.0 lb

## 2013-03-23 DIAGNOSIS — K625 Hemorrhage of anus and rectum: Secondary | ICD-10-CM

## 2013-03-23 DIAGNOSIS — K921 Melena: Secondary | ICD-10-CM

## 2013-03-23 LAB — CBC WITH DIFFERENTIAL/PLATELET
Basophils Relative: 0.7 % (ref 0.0–3.0)
Eosinophils Relative: 1.2 % (ref 0.0–5.0)
HCT: 29.6 % — ABNORMAL LOW (ref 36.0–46.0)
MCV: 76.9 fl — ABNORMAL LOW (ref 78.0–100.0)
Monocytes Absolute: 0.7 10*3/uL (ref 0.1–1.0)
Monocytes Relative: 9.3 % (ref 3.0–12.0)
Neutrophils Relative %: 63.5 % (ref 43.0–77.0)
Platelets: 316 10*3/uL (ref 150.0–400.0)
RBC: 3.84 Mil/uL — ABNORMAL LOW (ref 3.87–5.11)
WBC: 7.4 10*3/uL (ref 4.5–10.5)

## 2013-03-23 MED ORDER — MOVIPREP 100 G PO SOLR: 1.0000 | Freq: Once | ORAL | Status: DC

## 2013-03-23 NOTE — Patient Instructions (Addendum)
Your physician has requested that you go to the basement for the following lab work before leaving today: CBC, IBC  You have been scheduled for a colonoscopy with propofol. Please follow written instructions given to you at your visit today.  Please pick up your prep kit at the pharmacy within the next 1-3 days. If you use inhalers (even only as needed), please bring them with you on the day of your procedure. Your physician has requested that you go to www.startemmi.com and enter the access code given to you at your visit today. This web site gives a general overview about your procedure. However, you should still follow specific instructions given to you by our office regarding your preparation for the procedure.  CC: Dr Trinna Post Plotnikov

## 2013-03-23 NOTE — Progress Notes (Signed)
Priscilla Hill 1966-07-25 MRN [id number]   History of Present Illness:  This is a 47 year old white female with low to moderate volume painless rectal bleeding of 2 years duration. She sees blood in her stool as well as in the toilet on a daily basis. Her last hemoglobin in December 2013 was 12.1. She has irregular bowel movements daily. She does not use laxatives. There is no family history of colon cancer. She never had any rectal pain or rectal surgery.    Past Medical History  Diagnosis Date  . Hyperlipidemia     mild  . GERD (gastroesophageal reflux disease)   . History of positive PPD   . Anxiety    Past Surgical History  Procedure Laterality Date  . Essure tubal ligation      reports that she has never smoked. She has never used smokeless tobacco. She reports that  drinks alcohol. She reports that she does not use illicit drugs. family history includes Diabetes in her mother; Heart disease in her maternal grandfather, maternal grandmother, and mother; Hypertension in her father and mother; Stomach cancer in an unspecified family member; and Stroke (age of onset: 47) in her father. No Known Allergies      Review of Systems: Denies abdominal pain heartburn chest pain weight loss  The remainder of the 10 point ROS is negative except as outlined in H&P   Physical Exam: General appearance  Well developed, in no distress. Eyes- non icteric. HEENT nontraumatic, normocephalic. Mouth no lesions, tongue papillated, no cheilosis. Neck supple without adenopathy, thyroid not enlarged, no carotid bruits, no JVD. Lungs Clear to auscultation bilaterally. Cor normal S1, normal S2, regular rhythm, no murmur,  quiet precordium. Abdomen: Soft nontender abdomen with normal active bowel sounds. No distention. Liver edge at costal margin. No palpable mass or stool. Rectal: And anoscopy exam reveals normal perianal area. Normal rectal sphincter tone and stool mixed with blood in the rectal  ampulla. The blood is dark. The mucosa of the rectal ampulla appears normal. The are no hemorrhoids, fissures or proctitis. Digital exam to about 7-8 cm does not reveal any mass. Extremities no pedal edema. Skin no lesions. Neurological alert and oriented x 3. Psychological normal mood and affect.  Assessment and Plan:  Problem #1 Painless rectal bleeding in a 47 year old premenopausal female with a hemoglobin of 12.1. Anoscopic exam does not reveal any anorectal source of bleeding. We will proceed with a colonoscopy to look for polyps, cancer or segmental colitis. We will repeat her CBC as well as iron studies. I have discussed the prep, the sedation and the procedure with the patient. We will schedule it for as soon as possible.   03/23/2013 [name]

## 2013-03-26 ENCOUNTER — Encounter: Payer: Self-pay | Admitting: Internal Medicine

## 2013-03-26 ENCOUNTER — Other Ambulatory Visit: Payer: Self-pay | Admitting: Internal Medicine

## 2013-03-26 LAB — IBC PANEL
Iron: 27 ug/dL — ABNORMAL LOW (ref 42–145)
Transferrin: 310.4 mg/dL (ref 212.0–360.0)

## 2013-03-29 ENCOUNTER — Ambulatory Visit (AMBULATORY_SURGERY_CENTER): Payer: PRIVATE HEALTH INSURANCE | Admitting: Internal Medicine

## 2013-03-29 ENCOUNTER — Other Ambulatory Visit: Payer: Self-pay | Admitting: *Deleted

## 2013-03-29 ENCOUNTER — Encounter: Payer: Self-pay | Admitting: Internal Medicine

## 2013-03-29 ENCOUNTER — Other Ambulatory Visit (INDEPENDENT_AMBULATORY_CARE_PROVIDER_SITE_OTHER): Payer: PRIVATE HEALTH INSURANCE

## 2013-03-29 ENCOUNTER — Telehealth: Payer: Self-pay | Admitting: *Deleted

## 2013-03-29 VITALS — BP 108/71 | HR 74 | Temp 98.5°F | Resp 14 | Ht 65.0 in | Wt 128.0 lb

## 2013-03-29 DIAGNOSIS — K921 Melena: Secondary | ICD-10-CM

## 2013-03-29 DIAGNOSIS — K6389 Other specified diseases of intestine: Secondary | ICD-10-CM

## 2013-03-29 DIAGNOSIS — C187 Malignant neoplasm of sigmoid colon: Secondary | ICD-10-CM

## 2013-03-29 DIAGNOSIS — D126 Benign neoplasm of colon, unspecified: Secondary | ICD-10-CM

## 2013-03-29 DIAGNOSIS — D509 Iron deficiency anemia, unspecified: Secondary | ICD-10-CM

## 2013-03-29 LAB — COMPREHENSIVE METABOLIC PANEL
ALT: 17 U/L (ref 0–35)
BUN: 9 mg/dL (ref 6–23)
CO2: 23 mEq/L (ref 19–32)
Calcium: 8.4 mg/dL (ref 8.4–10.5)
Chloride: 108 mEq/L (ref 96–112)
Creatinine, Ser: 0.7 mg/dL (ref 0.4–1.2)
GFR: 101.53 mL/min (ref >60.00)

## 2013-03-29 MED ORDER — SODIUM CHLORIDE 0.9 % IV SOLN: 500.0000 mL | INTRAVENOUS | Status: DC

## 2013-03-29 NOTE — Patient Instructions (Addendum)
YOU HAD AN ENDOSCOPIC PROCEDURE TODAY AT THE [facility]: Refer to the procedure report that was given to you for any specific questions about what was found during the examination.  If the procedure report does not answer your questions, please call your gastroenterologist to clarify.  If you requested that your care partner not be given the details of your procedure findings, then the procedure report has been included in a sealed envelope for you to review at your convenience later.  YOU SHOULD EXPECT: Some feelings of bloating in the abdomen. Passage of more gas than usual.  Walking can help get rid of the air that was put into your GI tract during the procedure and reduce the bloating. If you had a lower endoscopy (such as a colonoscopy or flexible sigmoidoscopy) you may notice spotting of blood in your stool or on the toilet paper. If you underwent a bowel prep for your procedure, then you may not have a normal bowel movement for a few days.  DIET: Your first meal following the procedure should be a light meal and then it is ok to progress to your normal diet.  A half-sandwich or bowl of soup is an example of a good first meal.  Heavy or fried foods are harder to digest and may make you feel nauseous or bloated.  Likewise meals heavy in dairy and vegetables can cause extra gas to form and this can also increase the bloating.  Drink plenty of fluids but you should avoid alcoholic beverages for 24 hours.  ACTIVITY: Your care partner should take you home directly after the procedure.  You should plan to take it easy, moving slowly for the rest of the day.  You can resume normal activity the day after the procedure however you should NOT DRIVE or use heavy machinery for 24 hours (because of the sedation medicines used during the test).    SYMPTOMS TO REPORT IMMEDIATELY: A gastroenterologist can be reached at any hour.  During normal business hours, 8:30 AM to 5:00 PM Monday through Friday,  call [phone].  After hours and on weekends, please call the GI answering service at [phone] who will take a message and have the physician on call contact you.   Following lower endoscopy (colonoscopy or flexible sigmoidoscopy):  Excessive amounts of blood in the stool  Significant tenderness or worsening of abdominal pains  Swelling of the abdomen that is new, acute  Fever of 100F or higher    FOLLOW UP: If any biopsies were taken you will be contacted by phone or by letter within the next 1-3 weeks.  Call your gastroenterologist if you have not heard about the biopsies in 3 weeks.  Our staff will call the home number listed on your records the next business day following your procedure to check on you and address any questions or concerns that you may have at that time regarding the information given to you following your procedure. This is a courtesy call and so if there is no answer at the home number and we have not heard from you through the emergency physician on call, we will assume that you have returned to your regular daily activities without incident.  SIGNATURES/CONFIDENTIALITY: You and/or your care partner have signed paperwork which will be entered into your electronic medical record.  These signatures attest to the fact that that the information above on your After Visit Summary has been reviewed and is understood.  Full responsibility of the confidentiality   of this discharge information lies with you and/or your care-partner.     

## 2013-03-29 NOTE — Progress Notes (Signed)
Patient did not experience any of the following events: a burn prior to discharge; a fall within the facility; wrong site/side/patient/procedure/implant event; or a hospital transfer or hospital admission upon discharge from the facility. (G8907) Patient did not have preoperative order for IV antibiotic SSI prophylaxis. (G8918)  

## 2013-03-29 NOTE — Op Note (Signed)
[facility] [address] [place], [address]   COLONOSCOPY PROCEDURE REPORT  PATIENT: Shandiin, Eisenbeis  MR#: [id number] BIRTHDATE: 09-Aug-1966 , 47  yrs. old GENDER: Female ENDOSCOPIST: Hart Carwin, MD REFERRED BY:  Janeal Holmes, M.D. PROCEDURE DATE:  03/29/2013 PROCEDURE:   Colonoscopy with snare polypectomy and Colonoscopy with biopsy ASA CLASS:   Class I INDICATIONS:Iron Deficiency Anemia and hematochezia. MEDICATIONS: MAC sedation, administered by CRNA and propofol (Diprivan) 400mg  IV  DESCRIPTION OF PROCEDURE:   After the risks and benefits and of the procedure were explained, informed consent was obtained.  A digital rectal exam revealed no abnormalities of the rectum.    The LB [id number]-[id number]  endoscope was introduced through the anus and advanced to the cecum, which was identified by both the appendix and ileocecal valve .  The quality of the prep was good, using MoviPrep .  The instrument was then slowly withdrawn as the colon was fully examined.     COLON FINDINGS: A polypoid shaped pedunculated polyp measuring 12 mm in size was found in the sigmoid colon.  A polypectomy was performed using snare cautery.  The resection was complete and the polyp tissue was completely retrieved. The polypectomy site was marked with 2 cc's of SPOT, the polyp was located at 22cm-short scope( 40 cm looped scope)  Measuring 30 mm in diameter, fungating, infiltrative, polypoid, sessile, ulcerated and non-obstructing bleeding tumor/mass of unknown type was seen in the sigmoid colon.ar 20 cm short scope and 30cm looped scope, it was niopsied extensively and marked with a SPOT- 3 cc;  Multiple biopsies were performed using cold forceps.     Retroflexed views revealed no abnormalities.     The scope was then withdrawn from the patient and the procedure completed.  COMPLICATIONS: There were no complications. ENDOSCOPIC IMPRESSION: 1.   Pedunculated polyp  measuring 12 mm in size was found in the sigmoid colon; polypectomy was performed using snare cautery , marked with SPOT 2.   Bleeding polypoid 3 cm sessile  tumor/mass in the sigmoid colon; multiple biopsies were performed using cold forceps , (20 cm-30 cm from the rectum depending on short or long scope), tatoos applied  RECOMMENDATIONS: Await pathology results CT scan of the abd and pelvis IV and oral contrast begin Iron supplements c-met,CEA, will discuss results next week, I will be out of town but somebody is going to see you to discuss results     REPEAT EXAM: In 1 year(s)  for Colonoscopy.  cc:  _______________________________ eSignedHart Carwin, MD 03/29/2013 4:59 PM     PATIENT NAME:  Mahogony, Gilchrest MR#: [id number]

## 2013-03-29 NOTE — Telephone Encounter (Signed)
Per Dr. Juanda Chance, patient needs CT abd, pelvis, Cmet and CEA for mass of sigmoid. Labs in EPIC. Ct scheduled on 04/02/13 at 1:00 PM at [facility]) NPO 4 hours prior and contrast one and two hours prior. Clydie Braun at [facility] recovery given appointment date, time and instructions to give to patient.

## 2013-03-29 NOTE — Progress Notes (Signed)
LAB TECH IN TO DRAW BLOOD WORK.

## 2013-03-29 NOTE — Progress Notes (Signed)
Lidocaine-40mg IV prior to Propofol InductionPropofol given over incremental dosages 

## 2013-03-29 NOTE — Progress Notes (Signed)
CT CONTRAST GIVEN WITH INSTRUCTIONS AND DIRECTIONS TO [place] [facility].

## 2013-03-29 NOTE — Progress Notes (Signed)
Called to room to assist during endoscopic procedure.  Patient ID and intended procedure confirmed with present staff. Received instructions for my participation in the procedure from the performing physician.  

## 2013-03-30 ENCOUNTER — Telehealth: Payer: Self-pay | Admitting: *Deleted

## 2013-03-30 NOTE — Telephone Encounter (Signed)
  Follow up Call-  Call back number 03/29/2013  Post procedure Call Back phone  # [phone]  Permission to leave phone message Yes     Patient questions:  Do you have a fever, pain , or abdominal swelling? no Pain Score  0 *  Have you tolerated food without any problems? yes  Have you been able to return to your normal activities? Yes  Do you have any questions about your discharge instructions: Diet   no Medications  no Follow up visit  no  Do you have questions or concerns about your Care? no  Actions: * If pain score is 4 or above: No action needed, pain <4.

## 2013-04-02 ENCOUNTER — Ambulatory Visit (INDEPENDENT_AMBULATORY_CARE_PROVIDER_SITE_OTHER)
Admission: RE | Admit: 2013-04-02 | Discharge: 2013-04-02 | Disposition: A | Discharge status: Home / Self Care | Payer: PRIVATE HEALTH INSURANCE | Source: Ambulatory Visit | Attending: Internal Medicine | Admitting: Internal Medicine

## 2013-04-02 ENCOUNTER — Telehealth: Payer: Self-pay | Admitting: *Deleted

## 2013-04-02 DIAGNOSIS — K6389 Other specified diseases of intestine: Secondary | ICD-10-CM

## 2013-04-02 MED ORDER — IOHEXOL 300 MG/ML  SOLN
100.0000 mL | Freq: Once | INTRAMUSCULAR | Status: AC | PRN
Start: 2013-04-02 — End: 2013-04-02
  Administered 2013-04-02: 100 mL via INTRAVENOUS

## 2013-04-02 NOTE — Telephone Encounter (Signed)
Spoke with patient to schedule OV with extender to review biopsy and CT results. Patient cannot come except AM because she works second shift. Offered 10:30  Am on Tues but she wants an earlier appointment. Schedule on 04/05/13 at 8:30 AM.

## 2013-04-02 NOTE — Telephone Encounter (Signed)
Left a message for patient to call me. 

## 2013-04-03 ENCOUNTER — Ambulatory Visit (INDEPENDENT_AMBULATORY_CARE_PROVIDER_SITE_OTHER): Payer: PRIVATE HEALTH INSURANCE | Admitting: Physician Assistant

## 2013-04-03 ENCOUNTER — Telehealth: Payer: Self-pay | Admitting: *Deleted

## 2013-04-03 ENCOUNTER — Other Ambulatory Visit (INDEPENDENT_AMBULATORY_CARE_PROVIDER_SITE_OTHER): Payer: PRIVATE HEALTH INSURANCE

## 2013-04-03 ENCOUNTER — Encounter: Payer: Self-pay | Admitting: Physician Assistant

## 2013-04-03 VITALS — BP 100/60 | HR 80 | Ht 65.0 in | Wt 122.4 lb

## 2013-04-03 DIAGNOSIS — C187 Malignant neoplasm of sigmoid colon: Secondary | ICD-10-CM

## 2013-04-03 DIAGNOSIS — D509 Iron deficiency anemia, unspecified: Secondary | ICD-10-CM

## 2013-04-03 DIAGNOSIS — K625 Hemorrhage of anus and rectum: Secondary | ICD-10-CM

## 2013-04-03 LAB — CBC WITH DIFFERENTIAL/PLATELET
Basophils Absolute: 0 10*3/uL (ref 0.0–0.1)
Hemoglobin: 9.8 g/dL — ABNORMAL LOW (ref 12.0–15.0)
Lymphocytes Relative: 19.3 % (ref 12.0–46.0)
Monocytes Relative: 6.6 % (ref 3.0–12.0)
Neutro Abs: 6.1 10*3/uL (ref 1.4–7.7)
Neutrophils Relative %: 73.3 % (ref 43.0–77.0)
RDW: 16.4 % — ABNORMAL HIGH (ref 11.5–14.6)

## 2013-04-03 NOTE — Progress Notes (Signed)
Reviewed and agree, 

## 2013-04-03 NOTE — Progress Notes (Signed)
Subjective:    Patient ID: Priscilla Hill, female    DOB: 1966/09/12, 47 y.o.   MRN: [id number]  HPI 47 year old female recently known to Dr. [name] who was referred for iron deficiency anemia and hematochezia area Most recent hemoglobin was 9.7. Patient underwent colonoscopy on 03/29/2013 and unfortunately was found to have a 3 cm sessile tumor/mass in the sigmoid colon multiple biopsies were taken and this lesion was tattooed. She also had a 12  millimeter polyp in the sigmoid colon which was snared and removed. 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 Patient underwent CT scan of the abdomen and pelvis yesterday 04/02/2013 which shows a 2.2 cm mass in the mid sigmoid colon consistent with: Carcinoma. There was no evidence of metastatic disease. She has a 5-6 cm and heterogeneous mass in the uterus consistent with a fibroid. . Patient comes back in today to discuss path and CT findings. She says she has been very anxious since her colonoscopy and absolutely has no appetite and has lost about 6 pounds over the past week or so. Is not having any abdominal pain and has not seen any bleeding since the procedure. She has been doing research primarily on naturopathic supplements etc. to fight tumor cells and has many questions regarding vitamin supplements etc.    Review of Systems  Constitutional: Positive for appetite change and unexpected weight change.  HENT: Negative.   Eyes: Negative.   Respiratory: Negative.   Cardiovascular: Negative.   Gastrointestinal: Positive for blood in stool.  Endocrine: Negative.   Genitourinary: Negative.   Musculoskeletal: Negative.   Allergic/Immunologic: Negative.   Neurological: Negative.   Hematological: Negative.   Psychiatric/Behavioral: Negative.    Outpatient Prescriptions Prior to Visit  Medication Sig Dispense Refill  . cholecalciferol (VITAMIN D) 1000 UNITS tablet Take 1 tablet (1,000 Units  total) by mouth daily.  100 tablet  3  . Diclofenac Sodium (PENNSAID) 2 % SOLN Place 1 Act onto the skin 3 (three) times daily as needed.  112 g  6   No facility-administered medications prior to visit.   No Known Allergies     Patient Active Problem List   Diagnosis Date Noted  . 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 Well adult exam 11/14/2012  . Hematochezia 11/14/2012  . Paresthesia 11/14/2012  . Dyslipidemia 11/14/2012  . URI, acute 05/31/2011  . Vaginitis 05/31/2011  . ANXIETY 04/17/2010  . SHOULDER PAIN 04/17/2010  . THROAT PAIN, CHRONIC 03/14/2009  . DYSPHAGIA UNSPECIFIED 03/14/2009  . CONJUNCTIVITIS 12/05/2008  . ALLERGIC RHINITIS 10/08/2008  . Nonspecific (abnormal) findings on radiological and other examination of body structure 10/08/2008  . CHEST XRAY, ABNORMAL 10/08/2008  . Acute bronchitis 09/24/2008  . COUGH 09/24/2008     History  Substance Use Topics  . Smoking status: Never Smoker   . Smokeless tobacco: Never Used  . Alcohol Use: Yes     Comment: occasionally   family history includes Diabetes in her mother; Heart disease in her maternal grandfather, maternal grandmother, and mother; Hypertension in her father and mother; Stomach cancer in an unspecified family member; and Stroke (age of onset: 54) in her father.  Objective:   Physical Exam well-developed thin white female in no acute distress accompanied by her mother-in-law blood pressure 100/60 pulse 80 height 5 foot 5 weight 122. Not further examined today discussion only        Assessment & Plan:  #1 47 year old  female with new diagnosis of adenocarcinoma of the sigmoid colon. CT scan shows no evidence advanced local spread or metastatic disease. 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 She is taking a natural iron supplement called fluvital 15 mg twice daily, and has also started a B supplement. We will arrange for surgical consultation as soon as possible for resection. Repeat CBC today.

## 2013-04-03 NOTE — Telephone Encounter (Signed)
We called the patient to inform her that we got her an earlier appointment with [facility]. She will now see Dr. Abbey Chatters on Thursday 04-05-2013 at 10:30 am for an 11:00 am appointment. I called the patient to inform her.

## 2013-04-03 NOTE — Patient Instructions (Addendum)
Please go to the basement level to have your labs drawn.  We have made you a referral appointment at [facility], [address].  They gave [place] 04-23-2013 at 1:00  For a 1:30 PM appointment. Amy Esterwood PA-C is going to call to try to get a sooner appointment.  We will call you if we can get one.

## 2013-04-05 ENCOUNTER — Ambulatory Visit (INDEPENDENT_AMBULATORY_CARE_PROVIDER_SITE_OTHER): Payer: PRIVATE HEALTH INSURANCE | Admitting: General Surgery

## 2013-04-05 ENCOUNTER — Ambulatory Visit: Payer: PRIVATE HEALTH INSURANCE | Admitting: Physician Assistant

## 2013-04-05 ENCOUNTER — Encounter (INDEPENDENT_AMBULATORY_CARE_PROVIDER_SITE_OTHER): Payer: Self-pay | Admitting: General Surgery

## 2013-04-05 VITALS — BP 110/78 | HR 92 | Temp 98.5°F | Resp 14 | Ht 65.0 in | Wt 123.2 lb

## 2013-04-05 DIAGNOSIS — C189 Malignant neoplasm of colon, unspecified: Secondary | ICD-10-CM

## 2013-04-05 NOTE — Progress Notes (Signed)
Patient ID: Priscilla Hill, female   DOB: 02/11/1966, 47 y.o.   MRN: [id number]  No chief complaint on file.   HPI Priscilla Hill is a 47 y.o. female.   HPI  She is referred by Dr. Brodie for further treatment of newly diagnosed sigmoid colon cancer.  She has been having painless rectal bleeding with bowel movements over the past 2 years. She subsequently sought attention for this and underwent a colonoscopy. This demonstrated a 12 mm polyp in the sigmoid colon that was removed. 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 There was also a 3 cm mass in the sigmoid colon. Biopsy of this demonstrated adenocarcinoma. CEA level is normal. CT scan does not show evidence of metastatic disease. She is here to discuss further treatment. There is no family history of colon cancer.  Past Medical History  Diagnosis Date  . Hyperlipidemia     mild  . GERD (gastroesophageal reflux disease)   . History of positive PPD   . Anxiety     Past Surgical History  Procedure Laterality Date  . Essure tubal ligation    . Cervical polypectomy      Family History  Problem Relation Age of Onset  . Hypertension Mother   . Diabetes Mother   . Heart disease Mother   . Stroke Father 56  . Hypertension Father   . Heart disease Maternal Grandmother   . Heart disease Maternal Grandfather   . Stomach cancer      grandmother    Social History History  Substance Use Topics  . Smoking status: Never Smoker   . Smokeless tobacco: Never Used  . Alcohol Use: Yes     Comment: occasionally    No Known Allergies  Current Outpatient Prescriptions  Medication Sig Dispense Refill  . cholecalciferol (VITAMIN D) 1000 UNITS tablet Take 1 tablet (1,000 Units total) by mouth daily.  100 tablet  3  . Diclofenac Sodium (PENNSAID) 2 % SOLN Place 1 Act onto the skin 3 (three) times daily as needed.  112 g  6  . Probiotic Product (PROBIOTIC DAILY PO) Take 1 tablet by mouth daily.       No current  facility-administered medications for this visit.    Review of Systems Review of Systems  Constitutional: Negative.   HENT: Negative.   Respiratory: Negative.   Cardiovascular: Negative.   Gastrointestinal: Positive for anal bleeding.  Endocrine: Negative.   Genitourinary: Negative.   Musculoskeletal: Negative.   Skin: Negative.   Neurological: Negative.   Hematological: Negative.     Blood pressure 110/78, pulse 92, temperature 98.5 F (36.9 C), resp. rate 14, height 5' 5" (1.651 m), weight 123 lb 3.2 oz (55.883 kg), last menstrual period 03/17/2013.  Physical Exam Physical Exam  Constitutional: She appears well-developed and well-nourished. No distress.  HENT:  Head: Normocephalic and atraumatic.  Eyes: EOM are normal. No scleral icterus.  Neck: Neck supple.  Cardiovascular: Normal rate and regular rhythm.   Pulmonary/Chest: Effort normal and breath sounds normal.  Abdominal: Soft. She exhibits no distension and no mass. There is no tenderness.  Musculoskeletal: Normal range of motion. She exhibits no edema.  Lymphadenopathy:    She has no cervical adenopathy.  Neurological: She is alert.  Skin: Skin is warm and dry.  Psychiatric: She has a normal mood and affect. Her behavior is normal.    Data Reviewed Dr. Brodie's note.  Colonoscopy. CT scan. Pathology result.  Assessment    Newly   diagnosed sigmoid colon cancer. No radiographic evidence of metastasis.     Plan    I recommend a laparoscopic assisted partial colectomy. She is in agreement with this.  I have explained the procedure and risks of colon resection.  Risks include but are not limited to bleeding, infection, wound problems, anesthesia, anastomotic leak, need for colostomy, injury to intraabominal organs (such as intestine, spleen, kidney, bladder, ureter, etc.), ileus, irregular bowel habits.  She seems to understand and agrees to proceed.        Priscilla Hill 04/05/2013, 12:29 PM    

## 2013-04-05 NOTE — Patient Instructions (Signed)
CENTRAL  SURGERY  ONE-DAY (1) PRE-OP HOME COLON PREP INSTRUCTIONS: ** MIRALAX / GATORADE PREP **  Fill the two prescriptions at a pharmacy of your choice.  You must follow the instructions below carefully.  If you have questions or problems, please call and speak to someone in the clinic department at our office:   [phone].  MIRALAX - GATORADE -- DULCOLAX TABS:   Fill the prescriptions for MIRALAX  (255 gm bottle)    In addition, purchase four (4) DULCOLAX TABLETS (no prescription required), and one 64 oz GATORADE.  (Do NOT purchase red Gatorade; any other flavor is acceptable).  ANITIBIOTICS:   There will be 2 different antibiotics.     Take both prescriptions THE AFTERNOON BEFORE your surgery, at the times written on the bottles.  INSTRUCTIONS: 1. Five days prior to your procedure do not eat nuts, popcorn, or fruit with seeds.  Stop all fiber supplements such as Metamucil, Citrucel, etc.  2. The day before your procedure: o 6:00am:  take (4) Dulcolax tablets.  You should remain on clear liquids for the entire day.   CLEAR LIQUIDS: clear bouillon, broth, jello (NOT RED), black coffee, tea, soda, etc o 10:00am:  add the bottle of MiraLax to the 64-oz bottle of Gatorade, and dissolve.  Begin drinking the Gatorade mixture until gone (8 oz every 15-30 minutes).  Continue clear liquids until midnight (or bedtime). o Take the antibiotics at the times instructed on the bottles.  3. The day of your procedure:   Do not eat or drink ANYTHING after midnight before your surgery.     If you take Heart or Blood Pressure medicine, ask the pre-op nurses about these during your preop appointment.   Further pre-operative instructions will be given to you from the hospital.   Expect to be contacted 5-7 days before your surgery.       

## 2013-04-10 ENCOUNTER — Encounter (HOSPITAL_COMMUNITY): Payer: Self-pay | Admitting: Pharmacy Technician

## 2013-04-17 ENCOUNTER — Ambulatory Visit (HOSPITAL_COMMUNITY)
Admission: RE | Admit: 2013-04-17 | Discharge: 2013-04-17 | Disposition: A | Discharge status: Home / Self Care | Payer: PRIVATE HEALTH INSURANCE | Source: Ambulatory Visit | Attending: General Surgery | Admitting: General Surgery

## 2013-04-17 ENCOUNTER — Encounter (HOSPITAL_COMMUNITY)
Admission: RE | Admit: 2013-04-17 | Discharge: 2013-04-17 | Disposition: A | Discharge status: Home / Self Care | Payer: PRIVATE HEALTH INSURANCE | Source: Ambulatory Visit | Attending: General Surgery | Admitting: General Surgery

## 2013-04-17 ENCOUNTER — Encounter (HOSPITAL_COMMUNITY): Payer: Self-pay

## 2013-04-17 DIAGNOSIS — C187 Malignant neoplasm of sigmoid colon: Secondary | ICD-10-CM | POA: Insufficient documentation

## 2013-04-17 DIAGNOSIS — Z0183 Encounter for blood typing: Secondary | ICD-10-CM | POA: Insufficient documentation

## 2013-04-17 DIAGNOSIS — M479 Spondylosis, unspecified: Secondary | ICD-10-CM | POA: Insufficient documentation

## 2013-04-17 DIAGNOSIS — Z01812 Encounter for preprocedural laboratory examination: Secondary | ICD-10-CM | POA: Insufficient documentation

## 2013-04-17 DIAGNOSIS — Z01818 Encounter for other preprocedural examination: Secondary | ICD-10-CM | POA: Insufficient documentation

## 2013-04-17 HISTORY — PX: OTHER SURGICAL HISTORY: SHX169

## 2013-04-17 HISTORY — PX: HYSTEROSCOPY WITH RESECTOSCOPE: SHX5395

## 2013-04-17 LAB — PROTIME-INR: INR: 1.01 (ref 0.00–1.49)

## 2013-04-17 LAB — COMPREHENSIVE METABOLIC PANEL
ALT: 13 U/L (ref 0–35)
AST: 15 U/L (ref 0–37)
Alkaline Phosphatase: 53 U/L (ref 39–117)
CO2: 29 mEq/L (ref 19–32)
Chloride: 104 mEq/L (ref 96–112)
Creatinine, Ser: 0.6 mg/dL (ref 0.50–1.10)
GFR calc non Af Amer: >90 mL/min (ref >90)
Total Bilirubin: 0.6 mg/dL (ref 0.3–1.2)

## 2013-04-17 LAB — CBC WITH DIFFERENTIAL/PLATELET
Basophils Absolute: 0 10*3/uL (ref 0.0–0.1)
HCT: 33.5 % — ABNORMAL LOW (ref 36.0–46.0)
Hemoglobin: 10.5 g/dL — ABNORMAL LOW (ref 12.0–15.0)
Lymphocytes Relative: 20 % (ref 12–46)
Monocytes Absolute: 0.6 10*3/uL (ref 0.1–1.0)
Neutro Abs: 4.6 10*3/uL (ref 1.7–7.7)
RDW: 19 % — ABNORMAL HIGH (ref 11.5–15.5)
WBC: 6.6 10*3/uL (ref 4.0–10.5)

## 2013-04-17 LAB — SURGICAL PCR SCREEN: MRSA, PCR: NEGATIVE

## 2013-04-17 LAB — HCG, SERUM, QUALITATIVE: Preg, Serum: NEGATIVE

## 2013-04-17 LAB — ABO/RH: ABO/RH(D): O POS

## 2013-04-17 NOTE — Pre-Procedure Instructions (Signed)
04-17-13 CXR done as ordered.

## 2013-04-17 NOTE — Patient Instructions (Addendum)
20 SERGIO HOBART  04/17/2013   Your procedure is scheduled on:   04-24-2013  Report to [facility] at      0700  AM .  Call this number if you have problems the morning of surgery: [phone]  Or Presurgical Testing [phone](Josette Shimabukuro)   Remember: Follow any bowel prep instructions per MD office.    Do not eat food:After Midnight.    Take these medicines the morning of surgery with A SIP OF WATER: none   Do not wear jewelry, make-up or nail polish.  Do not wear lotions, powders, or perfumes. You may wear deodorant.  Do not shave 12 hours prior to first CHG shower(legs and under arms).(face and neck okay.)  Do not bring valuables to the hospital.  Contacts, dentures or bridgework,body piercing,  may not be worn into surgery.  Leave suitcase in the car. After surgery it may be brought to your room.  For patients admitted to the hospital, checkout time is 11:00 AM the day of discharge.   Patients discharged the day of surgery will not be allowed to drive home. Must have responsible person with you x 24 hours once discharged.  Name and phone number of your driver:Kevin-spouse [phone] cell   Special Instructions: CHG(Chlorhedine 4%-"Hibiclens","Betasept","Aplicare") Shower Use Special Wash: see special instructions.(avoid face and genitals)   Please read over the following fact sheets that you were given: MRSA Information, Blood Transfusion fact sheet, Incentive Spirometry Instruction.    Failure to follow these instructions may result in Cancellation of your surgery.   Patient signature_______________________________________________________

## 2013-04-23 ENCOUNTER — Ambulatory Visit (INDEPENDENT_AMBULATORY_CARE_PROVIDER_SITE_OTHER): Payer: Self-pay | Admitting: General Surgery

## 2013-04-24 ENCOUNTER — Encounter (HOSPITAL_COMMUNITY): Payer: Self-pay | Admitting: Certified Registered Nurse Anesthetist

## 2013-04-24 ENCOUNTER — Encounter (HOSPITAL_COMMUNITY)
Admission: RE | Disposition: A | Discharge status: Home / Self Care | Payer: Self-pay | Source: Ambulatory Visit | Attending: General Surgery

## 2013-04-24 ENCOUNTER — Inpatient Hospital Stay (HOSPITAL_COMMUNITY): Payer: PRIVATE HEALTH INSURANCE | Admitting: Certified Registered Nurse Anesthetist

## 2013-04-24 ENCOUNTER — Inpatient Hospital Stay (HOSPITAL_COMMUNITY)
Admission: RE | Admit: 2013-04-24 | Discharge: 2013-04-28 | DRG: 330 | Disposition: A | Discharge status: Home / Self Care | Payer: PRIVATE HEALTH INSURANCE | Source: Ambulatory Visit | Attending: General Surgery | Admitting: General Surgery

## 2013-04-24 ENCOUNTER — Encounter (HOSPITAL_COMMUNITY): Payer: Self-pay | Admitting: *Deleted

## 2013-04-24 DIAGNOSIS — K219 Gastro-esophageal reflux disease without esophagitis: Secondary | ICD-10-CM | POA: Diagnosis present

## 2013-04-24 DIAGNOSIS — Z8589 Personal history of malignant neoplasm of other organs and systems: Secondary | ICD-10-CM | POA: Diagnosis present

## 2013-04-24 DIAGNOSIS — D500 Iron deficiency anemia secondary to blood loss (chronic): Secondary | ICD-10-CM | POA: Diagnosis present

## 2013-04-24 DIAGNOSIS — K625 Hemorrhage of anus and rectum: Secondary | ICD-10-CM | POA: Diagnosis present

## 2013-04-24 DIAGNOSIS — R7611 Nonspecific reaction to tuberculin skin test without active tuberculosis: Secondary | ICD-10-CM | POA: Diagnosis present

## 2013-04-24 DIAGNOSIS — C189 Malignant neoplasm of colon, unspecified: Secondary | ICD-10-CM

## 2013-04-24 DIAGNOSIS — C772 Secondary and unspecified malignant neoplasm of intra-abdominal lymph nodes: Secondary | ICD-10-CM | POA: Diagnosis present

## 2013-04-24 DIAGNOSIS — E785 Hyperlipidemia, unspecified: Secondary | ICD-10-CM | POA: Diagnosis present

## 2013-04-24 DIAGNOSIS — D62 Acute posthemorrhagic anemia: Secondary | ICD-10-CM | POA: Diagnosis not present

## 2013-04-24 DIAGNOSIS — C187 Malignant neoplasm of sigmoid colon: Secondary | ICD-10-CM | POA: Diagnosis present

## 2013-04-24 DIAGNOSIS — F411 Generalized anxiety disorder: Secondary | ICD-10-CM | POA: Diagnosis present

## 2013-04-24 HISTORY — PX: LAPAROSCOPIC PARTIAL COLECTOMY: SHX5907

## 2013-04-24 LAB — CBC
HCT: 34 % — ABNORMAL LOW (ref 36.0–46.0)
Hemoglobin: 10.5 g/dL — ABNORMAL LOW (ref 12.0–15.0)
MCH: 26.1 pg (ref 26.0–34.0)
RBC: 4.03 MIL/uL (ref 3.87–5.11)

## 2013-04-24 LAB — CREATININE, SERUM: Creatinine, Ser: 0.7 mg/dL (ref 0.50–1.10)

## 2013-04-24 SURGERY — LAPAROSCOPIC PARTIAL COLECTOMY
Anesthesia: General | Wound class: Contaminated

## 2013-04-24 MED ORDER — DIPHENHYDRAMINE HCL 50 MG/ML IJ SOLN: 12.5000 mg | Freq: Four times a day (QID) | INTRAMUSCULAR | Status: DC | PRN

## 2013-04-24 MED ORDER — DEXTROSE 5 % IV SOLN
2.0000 g | Freq: Two times a day (BID) | INTRAVENOUS | Status: AC
  Administered 2013-04-24: 2 g via INTRAVENOUS
  Filled 2013-04-24 (×2): qty 2

## 2013-04-24 MED ORDER — PROMETHAZINE HCL 25 MG/ML IJ SOLN: 6.2500 mg | INTRAMUSCULAR | Status: DC | PRN

## 2013-04-24 MED ORDER — LACTATED RINGERS IV SOLN
INTRAVENOUS | Status: DC | PRN
  Administered 2013-04-24: 3000 mL

## 2013-04-24 MED ORDER — ALVIMOPAN 12 MG PO CAPS
12.0000 mg | ORAL_CAPSULE | Freq: Two times a day (BID) | ORAL | Status: DC
  Administered 2013-04-25 – 2013-04-27 (×6): 12 mg via ORAL
  Filled 2013-04-24 (×9): qty 1

## 2013-04-24 MED ORDER — LIDOCAINE HCL (CARDIAC) 20 MG/ML IV SOLN
INTRAVENOUS | Status: DC | PRN
  Administered 2013-04-24: 75 mg via INTRAVENOUS

## 2013-04-24 MED ORDER — ONDANSETRON HCL 4 MG/2ML IJ SOLN
INTRAMUSCULAR | Status: DC | PRN
  Administered 2013-04-24: 4 mg via INTRAVENOUS

## 2013-04-24 MED ORDER — 0.9 % SODIUM CHLORIDE (POUR BTL) OPTIME
TOPICAL | Status: DC | PRN
  Administered 2013-04-24 (×2): 2000 mL

## 2013-04-24 MED ORDER — NEOSTIGMINE METHYLSULFATE 1 MG/ML IJ SOLN
INTRAMUSCULAR | Status: DC | PRN
  Administered 2013-04-24: 3 mg via INTRAVENOUS

## 2013-04-24 MED ORDER — DEXTROSE 5 % IV SOLN
2.0000 g | INTRAVENOUS | Status: AC
  Administered 2013-04-24: 2 g via INTRAVENOUS
  Filled 2013-04-24: qty 2

## 2013-04-24 MED ORDER — NALOXONE HCL 0.4 MG/ML IJ SOLN: 0.4000 mg | INTRAMUSCULAR | Status: DC | PRN

## 2013-04-24 MED ORDER — DIPHENHYDRAMINE HCL 12.5 MG/5ML PO ELIX: 12.5000 mg | ORAL_SOLUTION | Freq: Four times a day (QID) | ORAL | Status: DC | PRN

## 2013-04-24 MED ORDER — HYDROMORPHONE HCL PF 1 MG/ML IJ SOLN
INTRAMUSCULAR | Status: AC
  Filled 2013-04-24: qty 1

## 2013-04-24 MED ORDER — LACTATED RINGERS IV SOLN: INTRAVENOUS | Status: DC

## 2013-04-24 MED ORDER — MORPHINE SULFATE (PF) 1 MG/ML IV SOLN
INTRAVENOUS | Status: DC
  Administered 2013-04-24: 4 mg via INTRAVENOUS
  Administered 2013-04-24: 2 mg via INTRAVENOUS
  Administered 2013-04-24: 13:00:00 via INTRAVENOUS
  Administered 2013-04-25: 4 mg via INTRAVENOUS
  Administered 2013-04-25: 3 mg via INTRAVENOUS
  Administered 2013-04-25: 5.5 mg via INTRAVENOUS
  Administered 2013-04-25: 6 mg via INTRAVENOUS
  Administered 2013-04-25: 3 mg via INTRAVENOUS
  Administered 2013-04-25: 6 mg via INTRAVENOUS
  Administered 2013-04-25: 17:00:00 via INTRAVENOUS
  Administered 2013-04-26 (×2): 2 mg via INTRAVENOUS
  Administered 2013-04-26: 1 mg via INTRAVENOUS
  Administered 2013-04-26: 2 mg via INTRAVENOUS
  Administered 2013-04-26: 4 mg via INTRAVENOUS
  Administered 2013-04-26: 2 mg via INTRAVENOUS
  Administered 2013-04-27: 1 mg via INTRAVENOUS
  Filled 2013-04-24: qty 25

## 2013-04-24 MED ORDER — HEPARIN SODIUM (PORCINE) 5000 UNIT/ML IJ SOLN
5000.0000 [IU] | Freq: Three times a day (TID) | INTRAMUSCULAR | Status: DC
  Administered 2013-04-25: 5000 [IU] via SUBCUTANEOUS
  Filled 2013-04-24 (×4): qty 1

## 2013-04-24 MED ORDER — PANTOPRAZOLE SODIUM 40 MG IV SOLR
40.0000 mg | INTRAVENOUS | Status: DC
  Administered 2013-04-24 – 2013-04-26 (×3): 40 mg via INTRAVENOUS
  Filled 2013-04-24 (×4): qty 40

## 2013-04-24 MED ORDER — HYDROMORPHONE HCL PF 1 MG/ML IJ SOLN
0.2500 mg | INTRAMUSCULAR | Status: DC | PRN
  Administered 2013-04-24 (×4): 0.5 mg via INTRAVENOUS

## 2013-04-24 MED ORDER — DEXAMETHASONE SODIUM PHOSPHATE 10 MG/ML IJ SOLN
INTRAMUSCULAR | Status: DC | PRN
  Administered 2013-04-24: 5 mg via INTRAVENOUS

## 2013-04-24 MED ORDER — BUPIVACAINE HCL (PF) 0.5 % IJ SOLN
INTRAMUSCULAR | Status: DC | PRN
  Administered 2013-04-24: 10 mL

## 2013-04-24 MED ORDER — KCL-LACTATED RINGERS-D5W 20 MEQ/L IV SOLN
INTRAVENOUS | Status: DC
  Administered 2013-04-24 – 2013-04-25 (×4): via INTRAVENOUS
  Administered 2013-04-25: 100 mL/h via INTRAVENOUS
  Administered 2013-04-26 – 2013-04-27 (×3): via INTRAVENOUS
  Filled 2013-04-24 (×12): qty 1000

## 2013-04-24 MED ORDER — SUFENTANIL CITRATE 50 MCG/ML IV SOLN
INTRAVENOUS | Status: DC | PRN
  Administered 2013-04-24 (×3): 10 ug via INTRAVENOUS
  Administered 2013-04-24: 20 ug via INTRAVENOUS

## 2013-04-24 MED ORDER — ONDANSETRON HCL 4 MG PO TABS: 4.0000 mg | ORAL_TABLET | Freq: Four times a day (QID) | ORAL | Status: DC | PRN

## 2013-04-24 MED ORDER — ONDANSETRON HCL 4 MG/2ML IJ SOLN: 4.0000 mg | Freq: Four times a day (QID) | INTRAMUSCULAR | Status: DC | PRN

## 2013-04-24 MED ORDER — GLYCOPYRROLATE 0.2 MG/ML IJ SOLN
INTRAMUSCULAR | Status: DC | PRN
  Administered 2013-04-24: .4 mg via INTRAVENOUS

## 2013-04-24 MED ORDER — SODIUM CHLORIDE 0.9 % IJ SOLN: 9.0000 mL | INTRAMUSCULAR | Status: DC | PRN

## 2013-04-24 MED ORDER — ALVIMOPAN 12 MG PO CAPS
12.0000 mg | ORAL_CAPSULE | Freq: Once | ORAL | Status: AC
  Administered 2013-04-24: 12 mg via ORAL
  Filled 2013-04-24: qty 1

## 2013-04-24 MED ORDER — SCOPOLAMINE 1 MG/3DAYS TD PT72
MEDICATED_PATCH | TRANSDERMAL | Status: DC | PRN
  Administered 2013-04-24: 1 via TRANSDERMAL

## 2013-04-24 MED ORDER — PROPOFOL 10 MG/ML IV BOLUS
INTRAVENOUS | Status: DC | PRN
  Administered 2013-04-24: 100 mg via INTRAVENOUS

## 2013-04-24 MED ORDER — MIDAZOLAM HCL 5 MG/5ML IJ SOLN
INTRAMUSCULAR | Status: DC | PRN
  Administered 2013-04-24: 2 mg via INTRAVENOUS

## 2013-04-24 MED ORDER — LACTATED RINGERS IV SOLN
INTRAVENOUS | Status: DC | PRN
  Administered 2013-04-24 (×2): via INTRAVENOUS

## 2013-04-24 MED ORDER — CISATRACURIUM BESYLATE (PF) 10 MG/5ML IV SOLN
INTRAVENOUS | Status: DC | PRN
  Administered 2013-04-24 (×2): 2 mg via INTRAVENOUS
  Administered 2013-04-24: 8 mg via INTRAVENOUS

## 2013-04-24 SURGICAL SUPPLY — 87 items
APPLIER CLIP 5 13 M/L LIGAMAX5 (MISCELLANEOUS)
APPLIER CLIP ROT 10 11.4 M/L (STAPLE)
APR CLP MED LRG 11.4X10 (STAPLE)
APR CLP MED LRG 5 ANG JAW (MISCELLANEOUS)
BLADE EXTENDED COATED 6.5IN (ELECTRODE) IMPLANT
BLADE HEX COATED 2.75 (ELECTRODE) ×2 IMPLANT
BLADE SURG SZ10 CARB STEEL (BLADE) ×2 IMPLANT
CABLE HIGH FREQUENCY MONO STRZ (ELECTRODE) ×2 IMPLANT
CANISTER SUCTION 2500CC (MISCELLANEOUS) ×2 IMPLANT
CANNULA ENDOPATH XCEL 11M (ENDOMECHANICALS) IMPLANT
CELLS DAT CNTRL 66122 CELL SVR (MISCELLANEOUS) ×1 IMPLANT
CLIP APPLIE 5 13 M/L LIGAMAX5 (MISCELLANEOUS) IMPLANT
CLIP APPLIE ROT 10 11.4 M/L (STAPLE) IMPLANT
CLOTH BEACON ORANGE TIMEOUT ST (SAFETY) ×2 IMPLANT
COVER MAYO STAND STRL (DRAPES) ×2 IMPLANT
DECANTER SPIKE VIAL GLASS SM (MISCELLANEOUS) ×2 IMPLANT
DISSECTOR BLUNT TIP ENDO 5MM (MISCELLANEOUS) IMPLANT
DRAPE LAPAROSCOPIC ABDOMINAL (DRAPES) ×2 IMPLANT
DRAPE LG THREE QUARTER DISP (DRAPES) IMPLANT
DRAPE UTILITY XL STRL (DRAPES) ×2 IMPLANT
DRAPE WARM FLUID 44X44 (DRAPE) ×2 IMPLANT
DRSG OPSITE POSTOP 3X4 (GAUZE/BANDAGES/DRESSINGS) ×1 IMPLANT
DRSG OPSITE POSTOP 4X6 (GAUZE/BANDAGES/DRESSINGS) ×1 IMPLANT
ELECT REM PT RETURN 9FT ADLT (ELECTROSURGICAL) ×2
ELECTRODE REM PT RTRN 9FT ADLT (ELECTROSURGICAL) ×1 IMPLANT
EVACUATOR SILICONE 100CC (DRAIN) ×1 IMPLANT
FILTER SMOKE EVAC LAPAROSHD (FILTER) ×2 IMPLANT
GAUZE SPONGE 2X2 8PLY STRL LF (GAUZE/BANDAGES/DRESSINGS) IMPLANT
GLOVE BIOGEL PI IND STRL 7.0 (GLOVE) ×1 IMPLANT
GLOVE BIOGEL PI INDICATOR 7.0 (GLOVE) ×1
GLOVE ECLIPSE 8.0 STRL XLNG CF (GLOVE) ×4 IMPLANT
GLOVE INDICATOR 8.0 STRL GRN (GLOVE) ×4 IMPLANT
GOWN STRL NON-REIN LRG LVL3 (GOWN DISPOSABLE) ×2 IMPLANT
GOWN STRL REIN XL XLG (GOWN DISPOSABLE) ×4 IMPLANT
KIT BASIN OR (CUSTOM PROCEDURE TRAY) ×2 IMPLANT
LEGGING LITHOTOMY PAIR STRL (DRAPES) IMPLANT
LIGASURE IMPACT 36 18CM CVD LR (INSTRUMENTS) ×1 IMPLANT
NS IRRIG 1000ML POUR BTL (IV SOLUTION) ×4 IMPLANT
PENCIL BUTTON HOLSTER BLD 10FT (ELECTRODE) ×2 IMPLANT
RELOAD PROXIMATE 75MM BLUE (ENDOMECHANICALS) ×2 IMPLANT
RELOAD STAPLE 75 3.8 BLU REG (ENDOMECHANICALS) IMPLANT
RETRACTOR WND ALEXIS 18 MED (MISCELLANEOUS) IMPLANT
RTRCTR WOUND ALEXIS 18CM MED (MISCELLANEOUS) ×2
SCALPEL HARMONIC ACE (MISCELLANEOUS) IMPLANT
SCISSORS LAP 5X35 DISP (ENDOMECHANICALS) ×2 IMPLANT
SET IRRIG TUBING LAPAROSCOPIC (IRRIGATION / IRRIGATOR) IMPLANT
SLEEVE XCEL OPT CAN 5 100 (ENDOMECHANICALS) IMPLANT
SOLUTION ANTI FOG 6CC (MISCELLANEOUS) ×2 IMPLANT
SPONGE DRAIN TRACH 4X4 STRL 2S (GAUZE/BANDAGES/DRESSINGS) ×1 IMPLANT
SPONGE GAUZE 2X2 STER 10/PKG (GAUZE/BANDAGES/DRESSINGS) ×1
SPONGE GAUZE 4X4 12PLY (GAUZE/BANDAGES/DRESSINGS) ×2 IMPLANT
SPONGE LAP 18X18 X RAY DECT (DISPOSABLE) ×4 IMPLANT
STAPLER PROXIMATE 75MM BLUE (STAPLE) ×1 IMPLANT
STAPLER VISISTAT 35W (STAPLE) ×2 IMPLANT
SUCTION POOLE TIP (SUCTIONS) ×2 IMPLANT
SUT ETHILON 2 0 PS N (SUTURE) ×1 IMPLANT
SUT ETHILON 3 0 PS 1 (SUTURE) ×1 IMPLANT
SUT MNCRL AB 4-0 PS2 18 (SUTURE) ×1 IMPLANT
SUT PDS AB 1 CTX 36 (SUTURE) ×2 IMPLANT
SUT PDS AB 1 TP1 96 (SUTURE) IMPLANT
SUT PROLENE 2 0 SH DA (SUTURE) IMPLANT
SUT SILK 2 0 (SUTURE) ×2
SUT SILK 2 0 SH CR/8 (SUTURE) ×2 IMPLANT
SUT SILK 2-0 18XBRD TIE 12 (SUTURE) ×1 IMPLANT
SUT SILK 3 0 (SUTURE) ×2
SUT SILK 3 0 SH CR/8 (SUTURE) ×3 IMPLANT
SUT SILK 3-0 18XBRD TIE 12 (SUTURE) ×1 IMPLANT
SUT VIC AB 0 CT1 27 (SUTURE) ×2
SUT VIC AB 0 CT1 27XBRD ANTBC (SUTURE) IMPLANT
SUT VIC AB 3-0 SH 27 (SUTURE) ×4
SUT VIC AB 3-0 SH 27X BRD (SUTURE) IMPLANT
SUT VICRYL 2 0 18  UND BR (SUTURE) ×1
SUT VICRYL 2 0 18 UND BR (SUTURE) ×1 IMPLANT
SYS LAPSCP GELPORT 120MM (MISCELLANEOUS)
SYSTEM LAPSCP GELPORT 120MM (MISCELLANEOUS) IMPLANT
TAPE CLOTH SURG 4X10 WHT LF (GAUZE/BANDAGES/DRESSINGS) ×1 IMPLANT
TOWEL OR 17X26 10 PK STRL BLUE (TOWEL DISPOSABLE) ×2 IMPLANT
TRAY FOLEY CATH 14FRSI W/METER (CATHETERS) ×2 IMPLANT
TRAY LAP CHOLE (CUSTOM PROCEDURE TRAY) ×2 IMPLANT
TROCAR BLADELESS OPT 5 100 (ENDOMECHANICALS) IMPLANT
TROCAR BLADELESS OPT 5 75 (ENDOMECHANICALS) ×3 IMPLANT
TROCAR SLEEVE XCEL 5X75 (ENDOMECHANICALS) ×3 IMPLANT
TROCAR XCEL BLUNT TIP 100MML (ENDOMECHANICALS) IMPLANT
TROCAR XCEL NON-BLD 11X100MML (ENDOMECHANICALS) IMPLANT
TUBING INSUFFLATION 10FT LAP (TUBING) ×2 IMPLANT
YANKAUER SUCT BULB TIP 10FT TU (MISCELLANEOUS) ×2 IMPLANT
YANKAUER SUCT BULB TIP NO VENT (SUCTIONS) ×2 IMPLANT

## 2013-04-24 NOTE — Anesthesia Postprocedure Evaluation (Signed)
Anesthesia Post Note  Patient: Priscilla Hill  Procedure(s) Performed: Procedure(s) (LRB): LAPAROSCOPIC ASSISTED SIGMOID COLECTOMY (N/A)  Anesthesia type: General  Patient location: PACU  Post pain: Pain level controlled  Post assessment: Post-op Vital signs reviewed  Last Vitals:  Filed Vitals:   04/24/13 1447  BP: 92/58  Pulse: 71  Temp: 36.6 C  Resp: 12    Post vital signs: Reviewed  Level of consciousness: sedated  Complications: No apparent anesthesia complications

## 2013-04-24 NOTE — Op Note (Signed)
Operative Note  Priscilla Hill female 47 y.o. 04/24/2013  PREOPERATIVE DX:    Sigmoid colon cancer  POSTOPERATIVE DX:  Same  PROCEDURE:  Laparoscopic assisted sigmoid colectomy with mobilization of splenic flexure         Surgeon: Adolph Pollack   Assistants: Dr. Luretha Murphy  Anesthesia: General endotracheal anesthesia  Indications: This is a 47 year old female with a history of painless rectal bleeding. She underwent a colonoscopy he was found to have an ulcerated tumor 20 cm from the anal verge. It was biopsied and positive for adenocarcinoma. CT scan does not show evidence of metastatic disease. She now presents for the above procedure.    Procedure Detail:  She was brought to the operating room placed supine on the operating table and a general anesthetic was given. She was placed in a lithotomy position. A Foley catheter was inserted. The abdominal wall and perianal areas were sterilely prepped and draped.  She was placed in slight reverse Trendelenburg position. A 5 mm incision was made in the left subcostal area. Using a 5 mm Optiview trocar a laparoscope access was gained into the peritoneal cavity. A pneumoperitoneum was created. Inspection of the area under the trocar demonstrated no evidence of bleeding or organ injury.  A 5 mm trocar was placed just above the umbilicus. A 5 mm trocar was placed in the right lower quadrant. A 5 mm trocar is placed in the suprapubic area.  The sigmoid colon and descending colon were mobilized by dividing lateral attachments sharply. The colon was medialized and blunt dissection an avascular plane. 2 tattoo marks were noted and the tumor could be visualized in the distal sigmoid colon. The rectosigmoid junction and proximal rectum were then mobilized by dividing lateral attachments. The left ureter was identified and preserved. The splenic flexure was mobilized by dividing its attachments sharply and using electrocautery. This allowed for  extra length of the descending colon.  The suprapubic trocar was removed and the limited lower midline incision was made through the skin subcutaneous tissue fascia and peritoneum entering the peritoneal cavity. A wound protection device was placed.  The sigmoid colon, proximal rectum, and descending colon were very mobile. The tumor was exteriorized. The small polyp proximal to it with the tattoo mark was also exteriorized. I divided the colon at the descending colon sigmoid colon junction with the GIA stapler. I then divided the colon at the rectosigmoid junction with a GIA stapler. Using the LigaSure and wedged shape resection of mesentery was performed. The distal aspect of the specimen was marked and handed off the field.  A side to side stapled anastomosis was then performed between the distal descending colon in the proximal rectum. The common defect was closed in 2 layers. The first layer was a full thickness running 3-0 Vicryl suture. The second layer was 3-0 silk interrupted Lembert type sutures. The anastomosis was patent, viable, and under no tension. A crotch stitch of 3-0 silk was placed.  Air was insufflated into the rectum and the anastomosis was placed under water. There is no evidence of leak. The right lower quadrant trocar was removed and a size 19 Blake drain was placed through this incision and positioned in the pelvis. It was anchored to the skin with 3-0 nylon suture. The abdominal cavity was then copiously irrigated and fluid evacuated as much as possible. There is no evidence of organ injury or bleeding.  The peritoneum of the limited lower midline incision was closed with running 0 Vicryl suture.  The fascia is approximated with a running #1 PDS suture.  The abdomen was reinsufflated and laparoscopic inspection was performed of all 4 quadrants and centrally. There is no evidence of bleeding organ injury. More irrigation fluid was evacuated. The trocars were then removed the  pneumoperitoneum was released.  The subcutaneous tissue of the limited lower midline incision was irrigated and the skin was closed with staples. Remaining trocar site incisions were closed with 4-0 Monocryl subcuticular stitches. The drain was hooked to closed suction. Sterile dressings were applied.  She tolerated the procedure without any apparent complications and was taken to the recovery room in satisfactory condition.  Estimated Blood Loss:  200 mL         Drains: JACKSON-PRATT (JP)  Blood Given: none          Specimens: Sigmoid colon        Complications:  * No complications entered in OR log *         Disposition: PACU - hemodynamically stable.         Condition: stable

## 2013-04-24 NOTE — Anesthesia Preprocedure Evaluation (Signed)
Anesthesia Evaluation  Patient identified by MRN, date of birth, ID band Patient awake    Reviewed: Allergy & Precautions, H&P , NPO status , Patient's Chart, lab work & pertinent test results  Airway Mallampati: II TM Distance: >3 FB Neck ROM: Full    Dental  (+) Teeth Intact and Dental Advisory Given   Pulmonary neg pulmonary ROS,  breath sounds clear to auscultation  Pulmonary exam normal       Cardiovascular negative cardio ROS  Rhythm:Regular Rate:Normal     Neuro/Psych negative neurological ROS  negative psych ROS   GI/Hepatic Neg liver ROS, GERD-  ,Neoplasm colon   Endo/Other  negative endocrine ROS  Renal/GU negative Renal ROS  negative genitourinary   Musculoskeletal negative musculoskeletal ROS (+)   Abdominal   Peds  Hematology negative hematology ROS (+)   Anesthesia Other Findings   Reproductive/Obstetrics negative OB ROS                           Anesthesia Physical Anesthesia Plan  ASA: II  Anesthesia Plan: General   Post-op Pain Management:    Induction: Intravenous  Airway Management Planned: Oral ETT  Additional Equipment:   Intra-op Plan:   Post-operative Plan: Extubation in OR  Informed Consent: I have reviewed the patients History and Physical, chart, labs and discussed the procedure including the risks, benefits and alternatives for the proposed anesthesia with the patient or authorized representative who has indicated his/her understanding and acceptance.   Dental advisory given  Plan Discussed with: CRNA  Anesthesia Plan Comments:         Anesthesia Quick Evaluation

## 2013-04-24 NOTE — Transfer of Care (Signed)
Immediate Anesthesia Transfer of Care Note  Patient: Priscilla Hill  Procedure(s) Performed: Procedure(s): LAPAROSCOPIC ASSISTED SIGMOID COLECTOMY (N/A)  Patient Location: PACU  Anesthesia Type:General  Level of Consciousness: awake, alert  and oriented  Airway & Oxygen Therapy: Patient Spontanous Breathing and Patient connected to face mask oxygen  Post-op Assessment: Report given to PACU RN and Post -op Vital signs reviewed and stable  Post vital signs: Reviewed and stable  Complications: No apparent anesthesia complications

## 2013-04-24 NOTE — Anesthesia Procedure Notes (Signed)
Procedure Name: Intubation Date/Time: 04/24/2013 9:43 AM Performed by: Leroy Libman L Patient Re-evaluated:Patient Re-evaluated prior to inductionOxygen Delivery Method: Circle system utilized Preoxygenation: Pre-oxygenation with 100% oxygen Intubation Type: IV induction Ventilation: Mask ventilation without difficulty and Oral airway inserted - appropriate to patient size Laryngoscope Size: Hyacinth Meeker and 2 Grade View: Grade I Tube type: Oral Tube size: 7.5 mm Number of attempts: 1 Airway Equipment and Method: Stylet Placement Confirmation: breath sounds checked- equal and bilateral,  ETT inserted through vocal cords under direct vision and positive ETCO2 Secured at: 21 cm Tube secured with: Tape Dental Injury: Teeth and Oropharynx as per pre-operative assessment

## 2013-04-24 NOTE — Preoperative (Signed)
Beta Blockers   Reason not to administer Beta Blockers:Not Applicable 

## 2013-04-24 NOTE — H&P (View-Only) (Signed)
Patient ID: Priscilla Hill, female   DOB: [birth year], 47 y.o.   MRN: [id number]  No chief complaint on file.   HPI Priscilla Hill is a 47 y.o. female.   HPI  She is referred by Dr. Juanda Chance for further treatment of newly diagnosed sigmoid colon cancer.  She has been having painless rectal bleeding with bowel movements over the past 2 years. She subsequently sought attention for this and underwent a colonoscopy. This demonstrated a 12 mm polyp in the sigmoid colon that was removed. 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 There was also a 3 cm mass in the sigmoid colon. Biopsy of this demonstrated adenocarcinoma. CEA level is normal. CT scan does not show evidence of metastatic disease. She is here to discuss further treatment. There is no family history of colon cancer.  Past Medical History  Diagnosis Date  . Hyperlipidemia     mild  . GERD (gastroesophageal reflux disease)   . History of positive PPD   . Anxiety     Past Surgical History  Procedure Laterality Date  . Essure tubal ligation    . Cervical polypectomy      Family History  Problem Relation Age of Onset  . Hypertension Mother   . Diabetes Mother   . Heart disease Mother   . Stroke Father 47  . Hypertension Father   . Heart disease Maternal Grandmother   . Heart disease Maternal Grandfather   . Stomach cancer      grandmother    Social History History  Substance Use Topics  . Smoking status: Never Smoker   . Smokeless tobacco: Never Used  . Alcohol Use: Yes     Comment: occasionally    No Known Allergies  Current Outpatient Prescriptions  Medication Sig Dispense Refill  . cholecalciferol (VITAMIN D) 1000 UNITS tablet Take 1 tablet (1,000 Units total) by mouth daily.  100 tablet  3  . Diclofenac Sodium (PENNSAID) 2 % SOLN Place 1 Act onto the skin 3 (three) times daily as needed.  112 g  6  . Probiotic Product (PROBIOTIC DAILY PO) Take 1 tablet by mouth daily.       No current  facility-administered medications for this visit.    Review of Systems Review of Systems  Constitutional: Negative.   HENT: Negative.   Respiratory: Negative.   Cardiovascular: Negative.   Gastrointestinal: Positive for anal bleeding.  Endocrine: Negative.   Genitourinary: Negative.   Musculoskeletal: Negative.   Skin: Negative.   Neurological: Negative.   Hematological: Negative.     Blood pressure 110/78, pulse 92, temperature 98.5 F (36.9 C), resp. rate 14, height 5\' 5"  (1.651 m), weight 123 lb 3.2 oz (55.883 kg), last menstrual period 03/17/2013.  Physical Exam Physical Exam  Constitutional: She appears well-developed and well-nourished. No distress.  HENT:  Head: Normocephalic and atraumatic.  Eyes: EOM are normal. No scleral icterus.  Neck: Neck supple.  Cardiovascular: Normal rate and regular rhythm.   Pulmonary/Chest: Effort normal and breath sounds normal.  Abdominal: Soft. She exhibits no distension and no mass. There is no tenderness.  Musculoskeletal: Normal range of motion. She exhibits no edema.  Lymphadenopathy:    She has no cervical adenopathy.  Neurological: She is alert.  Skin: Skin is warm and dry.  Psychiatric: She has a normal mood and affect. Her behavior is normal.    Data Reviewed Dr. Regino Schultze note.  Colonoscopy. CT scan. Pathology result.  Assessment    Newly  diagnosed sigmoid colon cancer. No radiographic evidence of metastasis.     Plan    I recommend a laparoscopic assisted partial colectomy. She is in agreement with this.  I have explained the procedure and risks of colon resection.  Risks include but are not limited to bleeding, infection, wound problems, anesthesia, anastomotic leak, need for colostomy, injury to intraabominal organs (such as intestine, spleen, kidney, bladder, ureter, etc.), ileus, irregular bowel habits.  She seems to understand and agrees to proceed.        Kamaal Cast J 04/05/2013, 12:29 PM

## 2013-04-24 NOTE — Interval H&P Note (Signed)
History and Physical Interval Note:  04/24/2013 9:14 AM  Priscilla Hill  has presented today for surgery, with the diagnosis of colon cancer   The various methods of treatment have been discussed with the patient and family. After consideration of risks, benefits and other options for treatment, the patient has consented to  Procedure(s): LAPAROSCOPIC PARTIAL COLECTOMY (N/A) as a surgical intervention .  The patient's history has been reviewed, patient examined, no change in status, stable for surgery.  I have reviewed the patient's chart and labs.  Questions were answered to the patient's satisfaction.     [name] Shela Commons

## 2013-04-25 ENCOUNTER — Encounter (HOSPITAL_COMMUNITY): Payer: Self-pay | Admitting: Anesthesiology

## 2013-04-25 ENCOUNTER — Encounter (HOSPITAL_COMMUNITY): Payer: Self-pay | Admitting: General Surgery

## 2013-04-25 DIAGNOSIS — Z8589 Personal history of malignant neoplasm of other organs and systems: Secondary | ICD-10-CM | POA: Diagnosis present

## 2013-04-25 DIAGNOSIS — D500 Iron deficiency anemia secondary to blood loss (chronic): Secondary | ICD-10-CM | POA: Diagnosis present

## 2013-04-25 DIAGNOSIS — C187 Malignant neoplasm of sigmoid colon: Secondary | ICD-10-CM | POA: Diagnosis present

## 2013-04-25 LAB — BASIC METABOLIC PANEL
Chloride: 107 mEq/L (ref 96–112)
GFR calc Af Amer: >90 mL/min (ref >90)
GFR calc non Af Amer: >90 mL/min (ref >90)
Glucose, Bld: 122 mg/dL — ABNORMAL HIGH (ref 70–99)
Potassium: 3.5 mEq/L (ref 3.5–5.1)
Sodium: 139 mEq/L (ref 135–145)

## 2013-04-25 LAB — CBC
HCT: 28.4 % — ABNORMAL LOW (ref 36.0–46.0)
Hemoglobin: 8.9 g/dL — ABNORMAL LOW (ref 12.0–15.0)
MCH: 26.4 pg (ref 26.0–34.0)
RBC: 3.37 MIL/uL — ABNORMAL LOW (ref 3.87–5.11)

## 2013-04-25 MED ORDER — HEPARIN SODIUM (PORCINE) 5000 UNIT/ML IJ SOLN
5000.0000 [IU] | Freq: Three times a day (TID) | INTRAMUSCULAR | Status: DC
  Administered 2013-04-25 – 2013-04-28 (×9): 5000 [IU] via SUBCUTANEOUS
  Filled 2013-04-25 (×13): qty 1

## 2013-04-25 NOTE — Care Management Note (Signed)
    Page 1 of 1   04/25/2013     11:26:36 AM   CARE MANAGEMENT NOTE 04/25/2013  Patient:  Priscilla Hill, Priscilla Hill   Account Number:  [id number]  Date Initiated:  04/25/2013  Documentation initiated by:  Lorenda Ishihara  Subjective/Objective Assessment:   47 yo female admitted s/p lap colectomy. PTA lived at home with spouse.     Action/Plan:   Home when stable   Anticipated DC Date:  04/28/2013   Anticipated DC Plan:  HOME/SELF CARE      DC Planning Services  CM consult      Choice offered to / List presented to:             Status of service:  Completed, signed off Medicare Important Message given?   (If response is "NO", the following Medicare IM given date fields will be blank) Date Medicare IM given:   Date Additional Medicare IM given:    Discharge Disposition:  HOME/SELF CARE  Per UR Regulation:  Reviewed for med. necessity/level of care/duration of stay  If discussed at Long Length of Stay Meetings, dates discussed:    Comments:

## 2013-04-25 NOTE — Progress Notes (Signed)
1 Day Post-Op  Subjective: Sore when she moves.  Passed some gas.  Objective: Vital signs in last 24 hours: Temp:  [97.9 F (36.6 C)-99.1 F (37.3 C)] 98.5 F (36.9 C) (06/04 0610) Pulse Rate:  [53-114] 57 (06/04 0610) Resp:  [8-20] 16 (06/04 0610) BP: (90-118)/(57-80) 97/65 mmHg (06/04 0610) SpO2:  [99 %-100 %] 100 % (06/04 0610) FiO2 (%):  [34 %-36 %] 36 % (06/04 0610) Weight:  [122 lb 6 oz (55.509 kg)] 122 lb 6 oz (55.509 kg) (06/03 1357) Last BM Date: 04/23/13  Intake/Output from previous day: 06/03 0701 - 06/04 0700 In: 4081.3 [I.V.:4081.3] Out: 1400 [Urine:1300; Drains:25; Blood:75] Intake/Output this shift: Total I/O In: 2031.3 [I.V.:2031.3] Out: 1170 [Urine:1150; Drains:20]  PE: General- In NAD Abdomen-soft, few bowel sounds, dressings dry, drain output serous  Lab Results:   Recent Labs  04/24/13 1419 04/25/13 0425  WBC 13.0* 9.7  HGB 10.5* 8.9*  HCT 34.0* 28.4*  PLT 255 197   BMET  Recent Labs  04/24/13 1419 04/25/13 0425  NA  --  139  K  --  3.5  CL  --  107  CO2  --  27  GLUCOSE  --  122*  BUN  --  5*  CREATININE 0.70 0.68  CALCIUM  --  8.3*   PT/INR No results found for this basename: LABPROT, INR,  in the last 72 hours Comprehensive Metabolic Panel:    Component Value Date/Time   NA 139 04/25/2013 0425   K 3.5 04/25/2013 0425   CL 107 04/25/2013 0425   CO2 27 04/25/2013 0425   BUN 5* 04/25/2013 0425   CREATININE 0.68 04/25/2013 0425   GLUCOSE 122* 04/25/2013 0425   CALCIUM 8.3* 04/25/2013 0425   AST 15 04/17/2013 1035   ALT 13 04/17/2013 1035   ALKPHOS 53 04/17/2013 1035   BILITOT 0.6 04/17/2013 1035   PROT 7.2 04/17/2013 1035   ALBUMIN 4.0 04/17/2013 1035     Studies/Results: No results found.  Anti-infectives: Anti-infectives   Start     Dose/Rate Route Frequency Ordered Stop   04/24/13 2200  cefoTEtan (CEFOTAN) 2 g in dextrose 5 % 50 mL IVPB     2 g 100 mL/hr over 30 Minutes Intravenous Every 12 hours 04/24/13 1409 04/24/13 2253   04/24/13 0703  cefOXitin (MEFOXIN) 2 g in dextrose 5 % 50 mL IVPB     2 g 100 mL/hr over 30 Minutes Intravenous On call to O.R. 04/24/13 0703 04/24/13 0945      Assessment Principal Problem:   Cancer of sigmoid colon s/p laparoscopic sigmoid colectomy 04/24/13 Active Problems:   Anemia, ABL on chronic anemia-hemoglobin 8.9 post op from 10.5 preop.    LOS: 1 day   Plan: Mobilize.  Clear liquids.   Natane Heward J 04/25/2013

## 2013-04-26 LAB — CBC
HCT: 28.4 % — ABNORMAL LOW (ref 36.0–46.0)
MCV: 85.5 fL (ref 78.0–100.0)
RBC: 3.32 MIL/uL — ABNORMAL LOW (ref 3.87–5.11)
WBC: 7.3 10*3/uL (ref 4.0–10.5)

## 2013-04-26 NOTE — Progress Notes (Signed)
2 Days Post-Op  Subjective: Remains sore.  No flatus or BM recently.  Objective: Vital signs in last 24 hours: Temp:  [98 F (36.7 C)-99.5 F (37.5 C)] 98.4 F (36.9 C) (06/05 0522) Pulse Rate:  [54-77] 61 (06/05 0522) Resp:  [16-20] 16 (06/05 0522) BP: (93-103)/(53-63) 96/63 mmHg (06/05 0522) SpO2:  [96 %-100 %] 98 % (06/05 0522) FiO2 (%):  [38 %] 38 % (06/05 0400) Last BM Date: 04/23/13  Intake/Output from previous day: 06/04 0701 - 06/05 0700 In: 3470.4 [P.O.:1080; I.V.:2390.4] Out: 2290 [Urine:2150; Drains:140] Intake/Output this shift:    PE: General- In NAD Abdomen-soft, dressings dry, drain output serous  Lab Results:   Recent Labs  04/25/13 0425 04/26/13 0407  WBC 9.7 7.3  HGB 8.9* 8.7*  HCT 28.4* 28.4*  PLT 197 169   BMET  Recent Labs  04/24/13 1419 04/25/13 0425  NA  --  139  K  --  3.5  CL  --  107  CO2  --  27  GLUCOSE  --  122*  BUN  --  5*  CREATININE 0.70 0.68  CALCIUM  --  8.3*   PT/INR No results found for this basename: LABPROT, INR,  in the last 72 hours Comprehensive Metabolic Panel:    Component Value Date/Time   NA 139 04/25/2013 0425   K 3.5 04/25/2013 0425   CL 107 04/25/2013 0425   CO2 27 04/25/2013 0425   BUN 5* 04/25/2013 0425   CREATININE 0.68 04/25/2013 0425   GLUCOSE 122* 04/25/2013 0425   CALCIUM 8.3* 04/25/2013 0425   AST 15 04/17/2013 1035   ALT 13 04/17/2013 1035   ALKPHOS 53 04/17/2013 1035   BILITOT 0.6 04/17/2013 1035   PROT 7.2 04/17/2013 1035   ALBUMIN 4.0 04/17/2013 1035     Studies/Results: No results found.  Anti-infectives: Anti-infectives   Start     Dose/Rate Route Frequency Ordered Stop   04/24/13 2200  cefoTEtan (CEFOTAN) 2 g in dextrose 5 % 50 mL IVPB     2 g 100 mL/hr over 30 Minutes Intravenous Every 12 hours 04/24/13 1409 04/24/13 2253   04/24/13 0703  cefOXitin (MEFOXIN) 2 g in dextrose 5 % 50 mL IVPB     2 g 100 mL/hr over 30 Minutes Intravenous On call to O.R. 04/24/13 0703 04/24/13 0945       Assessment Principal Problem:   Cancer of sigmoid colon s/p laparoscopic sigmoid colectomy 04/24/13-tolerated clear liquids yesterday Active Problems:   Anemia, ABL on chronic anemia-hemoglobin 8.7 and stable    LOS: 2 days   Plan: Decrease IVF, advance to full liquid diet.   Priscilla Hill 04/26/2013

## 2013-04-27 MED ORDER — VITAMIN D3 25 MCG (1000 UNIT) PO TABS
1000.0000 [IU] | ORAL_TABLET | Freq: Every day | ORAL | Status: DC
  Administered 2013-04-27: 1000 [IU] via ORAL
  Filled 2013-04-27 (×3): qty 1

## 2013-04-27 MED ORDER — ACETAMINOPHEN 500 MG PO TABS
1000.0000 mg | ORAL_TABLET | Freq: Three times a day (TID) | ORAL | Status: DC
  Administered 2013-04-27 (×2): 1000 mg via ORAL
  Filled 2013-04-27 (×5): qty 2

## 2013-04-27 MED ORDER — OXYCODONE HCL 5 MG PO TABS: 5.0000 mg | ORAL_TABLET | ORAL | Status: DC | PRN

## 2013-04-27 MED ORDER — MAGIC MOUTHWASH
15.0000 mL | Freq: Four times a day (QID) | ORAL | Status: DC | PRN
  Filled 2013-04-27: qty 15

## 2013-04-27 MED ORDER — PROMETHAZINE HCL 25 MG/ML IJ SOLN: 12.5000 mg | Freq: Four times a day (QID) | INTRAMUSCULAR | Status: DC | PRN

## 2013-04-27 MED ORDER — SACCHAROMYCES BOULARDII 250 MG PO CAPS
250.0000 mg | ORAL_CAPSULE | Freq: Two times a day (BID) | ORAL | Status: DC
  Administered 2013-04-27: 250 mg via ORAL
  Filled 2013-04-27 (×3): qty 1

## 2013-04-27 MED ORDER — SODIUM CHLORIDE 0.9 % IJ SOLN
3.0000 mL | Freq: Two times a day (BID) | INTRAMUSCULAR | Status: DC
  Administered 2013-04-27: 3 mL via INTRAVENOUS

## 2013-04-27 MED ORDER — SODIUM CHLORIDE 0.9 % IJ SOLN: 3.0000 mL | INTRAMUSCULAR | Status: DC | PRN

## 2013-04-27 MED ORDER — LIP MEDEX EX OINT
1.0000 "application " | TOPICAL_OINTMENT | Freq: Two times a day (BID) | CUTANEOUS | Status: DC
  Administered 2013-04-27: 1 via TOPICAL
  Filled 2013-04-27: qty 7

## 2013-04-27 MED ORDER — LACTATED RINGERS IV BOLUS (SEPSIS): 1000.0000 mL | Freq: Three times a day (TID) | INTRAVENOUS | Status: DC | PRN

## 2013-04-27 MED ORDER — ALUM & MAG HYDROXIDE-SIMETH 200-200-20 MG/5ML PO SUSP: 30.0000 mL | Freq: Four times a day (QID) | ORAL | Status: DC | PRN

## 2013-04-27 MED ORDER — MORPHINE SULFATE 2 MG/ML IJ SOLN
2.0000 mg | INTRAMUSCULAR | Status: DC | PRN
Start: 2013-04-27 — End: 2013-04-28

## 2013-04-27 NOTE — Progress Notes (Signed)
Priscilla Hill [id number] [birth year]  CARE TEAM:  PCP: Sonda Primes, MD  Outpatient Care Team: Patient Care Team: Tresa Garter, MD as PCP - General Lynden Ang, NP as Nurse Practitioner (Obstetrics and Gynecology) Adolph Pollack, MD as Consulting Physician (General Surgery)  Inpatient Treatment Team: Treatment Team: Attending Provider: Adolph Pollack, MD; Technician: Lynden Ang, NT; Registered Nurse: Tristan Schroeder, RN; Registered Nurse: Stanford Breed, RN; Registered Nurse: Horton Marshall; Consulting Physician: Ardeth Sportsman, MD   Subjective:  Sitting in bed, calm Daughter in room Used PCA x2 past shift  Objective:  Vital signs:  Filed Vitals:   04/27/13 0400 04/27/13 0500 04/27/13 0800 04/27/13 1229  BP:  100/65    Pulse:  69    Temp:  98.4 F (36.9 C)    TempSrc:  Oral    Resp: 16 16 12 12   Height:      Weight:      SpO2: 100% 100% 100% 100%    Last BM Date: 04/23/13  Intake/Output   Yesterday:  06/05 0701 - 06/06 0700 In: 2194.2 [P.O.:840; I.V.:1354.2] Out: 990 [Urine:750; Drains:240] This shift:  Total I/O In: 345.8 [P.O.:120; I.V.:225.8] Out: 350 [Urine:300; Drains:50]  Bowel function:  Flatus: y  BM: small  Drain: serosanguinous  Physical Exam:  General: Pt awake/alert/oriented x4 in no acute distress Eyes: PERRL, normal EOM.  Sclera clear.  No icterus Neuro: CN II-XII intact w/o focal sensory/motor deficits. Lymph: No head/neck/groin lymphadenopathy Psych:  No delerium/psychosis/paranoia HENT: Normocephalic, Mucus membranes moist.  No thrush Neck: Supple, No tracheal deviation Chest: No chest wall pain w good excursion CV:  Pulses intact.  Regular rhythm MS: Normal AROM mjr joints.  No obvious deformity Abdomen: Soft.  Nondistended.  Mildly tender at incisions only.  No evidence of peritonitis.  No incarcerated hernias. Ext:  SCDs BLE.  No mjr edema.  No cyanosis Skin: No petechiae / purpura   Problem  List:   Principal Problem:   Cancer of sigmoid colon pT2pN1b (2/15 LN) - s/p lap colectomy 04/24/13 Active Problems:   Anemia due to chronic blood loss   Assessment  Priscilla Hill  47 y.o. female  3 Days Post-Op  Procedure(s): LAPAROSCOPIC ASSISTED SIGMOID COLECTOMY  Recovering well w ileus resolving  Plan:  -adv diet -d/c IVF -d/c PCA -switch to PO pain control gradually -VTE prophylaxis- SCDs, etc -mobilize as tolerated to help recovery  D/C patient from hospital when patient meets criteria (anticipate in 1-2 day(s)):  Tolerating oral intake well Ambulating in walkways Adequate pain control without IV medications Urinating  Having flatus  Pathology c/w pT2pN1b cancer.  Stage III.  Would benefit from med onc eval for probable adjuvant chemotherapy.  D/w pt & daughter.  Copy of pathology report left in room.  Request made to set up MedOnc outpatient eval & set up for GI Tumor Board.   Ardeth Sportsman, M.D., F.A.C.S. Gastrointestinal and Minimally Invasive Surgery [facility] [address], [address] [place], [place] [address] [phone] Main / Paging   04/27/2013   Results:   Labs: Results for orders placed during the hospital encounter of 04/24/13 (from the past 48 hour(s))  CBC     Status: Abnormal   Collection Time    04/26/13  4:07 AM      Result Value Range   WBC 7.3  4.0 - 10.5 K/uL   RBC 3.32 (*) 3.87 - 5.11 MIL/uL   Hemoglobin 8.7 (*)  12.0 - 15.0 g/dL   HCT 21.3 (*) 08.6 - 57.8 %   MCV 85.5  78.0 - 100.0 fL   MCH 26.2  26.0 - 34.0 pg   MCHC 30.6  30.0 - 36.0 g/dL   RDW 46.9 (*) 62.9 - 52.8 %   Platelets 169  150 - 400 K/uL    Imaging / Studies: No results found.  Medications / Allergies: per chart  Antibiotics: Anti-infectives   Start     Dose/Rate Route Frequency Ordered Stop   04/24/13 2200  cefoTEtan (CEFOTAN) 2 g in dextrose 5 % 50 mL IVPB     2 g 100 mL/hr over 30 Minutes Intravenous Every 12 hours 04/24/13  1409 04/24/13 2253   04/24/13 0703  cefOXitin (MEFOXIN) 2 g in dextrose 5 % 50 mL IVPB     2 g 100 mL/hr over 30 Minutes Intravenous On call to O.R. 04/24/13 4132 04/24/13 0945

## 2013-04-28 NOTE — Progress Notes (Signed)
Patient given Rx for oxycodone. States understanding of discharge instructions and that instructions are fine in english.

## 2013-04-28 NOTE — Progress Notes (Signed)
4 Days Post-Op  Subjective: No complaints  Objective: Vital signs in last 24 hours: Temp:  [98.3 F (36.8 C)-98.6 F (37 C)] 98.3 F (36.8 C) (06/07 0625) Pulse Rate:  [68-77] 72 (06/07 0625) Resp:  [12-16] 16 (06/07 0625) BP: (92-103)/(61-72) 98/65 mmHg (06/07 0625) SpO2:  [100 %] 100 % (06/07 0625) Last BM Date: 04/27/13  Intake/Output from previous day: 06/06 0701 - 06/07 0700 In: 945.8 [P.O.:720; I.V.:225.8] Out: 420 [Urine:300; Drains:120] Intake/Output this shift:    GI: soft, nontender. incision ok  Lab Results:   Recent Labs  04/26/13 0407  WBC 7.3  HGB 8.7*  HCT 28.4*  PLT 169   BMET No results found for this basename: NA, K, CL, CO2, GLUCOSE, BUN, CREATININE, CALCIUM,  in the last 72 hours PT/INR No results found for this basename: LABPROT, INR,  in the last 72 hours ABG No results found for this basename: PHART, PCO2, PO2, HCO3,  in the last 72 hours  Studies/Results: No results found.  Anti-infectives: Anti-infectives   Start     Dose/Rate Route Frequency Ordered Stop   04/24/13 2200  cefoTEtan (CEFOTAN) 2 g in dextrose 5 % 50 mL IVPB     2 g 100 mL/hr over 30 Minutes Intravenous Every 12 hours 04/24/13 1409 04/24/13 2253   04/24/13 0703  cefOXitin (MEFOXIN) 2 g in dextrose 5 % 50 mL IVPB     2 g 100 mL/hr over 30 Minutes Intravenous On call to O.R. 04/24/13 0703 04/24/13 0945      Assessment/Plan: s/p Procedure(s): LAPAROSCOPIC ASSISTED SIGMOID COLECTOMY (N/A) Discharge  LOS: 4 days    TOTH III,PAUL S 04/28/2013

## 2013-04-30 ENCOUNTER — Telehealth: Payer: Self-pay | Admitting: Oncology

## 2013-04-30 ENCOUNTER — Telehealth: Payer: Self-pay | Admitting: *Deleted

## 2013-04-30 ENCOUNTER — Other Ambulatory Visit (INDEPENDENT_AMBULATORY_CARE_PROVIDER_SITE_OTHER): Payer: Self-pay | Admitting: General Surgery

## 2013-04-30 DIAGNOSIS — C189 Malignant neoplasm of colon, unspecified: Secondary | ICD-10-CM

## 2013-04-30 NOTE — Telephone Encounter (Signed)
Spoke with patient by phone and conformed appointment with Dr. Truett Perna for 05/16/13.  Contact names and numbers were provided.

## 2013-04-30 NOTE — Telephone Encounter (Signed)
Pt scheduled to see Dr. Truett Perna 06/25 @ 10:30. Welcome packet mailed.

## 2013-05-04 ENCOUNTER — Encounter (INDEPENDENT_AMBULATORY_CARE_PROVIDER_SITE_OTHER): Payer: Self-pay

## 2013-05-04 ENCOUNTER — Ambulatory Visit (INDEPENDENT_AMBULATORY_CARE_PROVIDER_SITE_OTHER): Payer: PRIVATE HEALTH INSURANCE

## 2013-05-04 VITALS — BP 116/70 | HR 68 | Temp 99.0°F | Resp 18

## 2013-05-04 DIAGNOSIS — Z4802 Encounter for removal of sutures: Secondary | ICD-10-CM

## 2013-05-04 NOTE — Progress Notes (Signed)
Patient in for nurse only staple removal; Abdomen staples intact; No redness, no drainage no odor noted .Patient afebrile. Cleansed area with cholra prep ; removed 10 staples; applied steri  Strip glue to area, applied 1/4 inch steri strips to abdomen incision. Patient tolerated well. Advised pateint to call if temp 100.3 or greater , drainage , redness  . Patient verbalizes understanding

## 2013-05-14 ENCOUNTER — Ambulatory Visit (INDEPENDENT_AMBULATORY_CARE_PROVIDER_SITE_OTHER): Payer: PRIVATE HEALTH INSURANCE | Admitting: General Surgery

## 2013-05-14 ENCOUNTER — Encounter (INDEPENDENT_AMBULATORY_CARE_PROVIDER_SITE_OTHER): Payer: Self-pay | Admitting: General Surgery

## 2013-05-14 VITALS — BP 98/62 | HR 64 | Temp 99.0°F | Resp 15 | Ht 65.0 in | Wt 122.0 lb

## 2013-05-14 DIAGNOSIS — Z9889 Other specified postprocedural states: Secondary | ICD-10-CM

## 2013-05-14 NOTE — Patient Instructions (Signed)
Continue light activities. May drive when you are pain-free. 

## 2013-05-14 NOTE — Progress Notes (Signed)
Procedure:  Laparoscopic-assisted sigmoid colectomy  Date:  04/24/2013  Pathology:  T2N1b  History:  She is here for a postoperative visit. Her energy level is slowly returning. She is eating well. Her bowels are moving. She has an appointment with oncology in 2 days.  Exam: General- Is in NAD.  Abdomen-Soft, incisions are clean and intact and solid.  Assessment:  Stage III sigmoid colon cancer status post sigmoid colectomy. She is progressing well.  Plan:  Continue light activities. Return visit 4 weeks.

## 2013-05-16 ENCOUNTER — Telehealth: Payer: Self-pay | Admitting: Oncology

## 2013-05-16 ENCOUNTER — Ambulatory Visit (HOSPITAL_BASED_OUTPATIENT_CLINIC_OR_DEPARTMENT_OTHER): Payer: PRIVATE HEALTH INSURANCE | Admitting: Oncology

## 2013-05-16 ENCOUNTER — Ambulatory Visit (HOSPITAL_BASED_OUTPATIENT_CLINIC_OR_DEPARTMENT_OTHER): Payer: PRIVATE HEALTH INSURANCE | Admitting: Lab

## 2013-05-16 ENCOUNTER — Other Ambulatory Visit: Payer: Self-pay | Admitting: *Deleted

## 2013-05-16 ENCOUNTER — Telehealth: Payer: Self-pay | Admitting: *Deleted

## 2013-05-16 ENCOUNTER — Encounter: Payer: Self-pay | Admitting: Oncology

## 2013-05-16 ENCOUNTER — Ambulatory Visit (HOSPITAL_BASED_OUTPATIENT_CLINIC_OR_DEPARTMENT_OTHER): Payer: PRIVATE HEALTH INSURANCE

## 2013-05-16 VITALS — BP 99/72 | HR 80 | Temp 98.6°F | Resp 18 | Ht 65.0 in | Wt 121.7 lb

## 2013-05-16 DIAGNOSIS — C189 Malignant neoplasm of colon, unspecified: Secondary | ICD-10-CM

## 2013-05-16 DIAGNOSIS — R300 Dysuria: Secondary | ICD-10-CM

## 2013-05-16 LAB — URINALYSIS, MICROSCOPIC - CHCC
Nitrite: NEGATIVE
Urobilinogen, UR: 0.2 mg/dL (ref 0.2–1)
pH: 8 (ref 4.6–8.0)

## 2013-05-16 NOTE — Discharge Summary (Signed)
Physician Discharge Summary  Patient ID: AUDYN DIMERCURIO MRN: [id number] DOB/AGE: 48-Jul-1967 48 y.o.  Admit date: 04/24/2013 Discharge date: 04/28/2013  Admission Diagnoses:  Sigmoid colon cancer  Discharge Diagnoses:  Principal Problem:   Cancer of sigmoid colon pT2pN1b (2/15 LN) - s/p lap colectomy 04/24/13 Active Problems:   Anemia due to chronic blood loss   Discharged Condition: good  Hospital Course: She underwent a laparoscopic-assisted sigmoid colectomy and did well from this. She had resumption of her bowel function. She is anicteric. She is tolerating her diet. Her wound was clean and intact. She'll be discharged on postoperative day #4. She was given discharge instructions. She'll return to the office for staple removal.  Consults: None  Significant Diagnostic Studies: None  Treatments: surgery: Laparoscopic assisted sigmoid colectomy  Discharge Exam: Blood pressure 98/65, pulse 72, temperature 98.3 F (36.8 C), temperature source Oral, resp. rate 16, height 5\' 5"  (1.651 m), weight 122 lb 6 oz (55.509 kg), last menstrual period 04/10/2013, SpO2 100.00%.   Disposition: 01-Home or Self Care  Discharge Orders   Future Appointments Provider Department Dept Phone   05/16/2013 11:00 AM Ladene Artist, MD [facility] MEDICAL ONCOLOGY [phone]   06/11/2013 3:40 PM Adolph Pollack, MD [facility], [place] [phone]   Future Orders Complete By Expires     Call MD for:  difficulty breathing, headache or visual disturbances  As directed     Call MD for:  extreme fatigue  As directed     Call MD for:  extreme fatigue  As directed     Call MD for:  hives  As directed     Call MD for:  hives  As directed     Call MD for:  persistant dizziness or light-headedness  As directed     Call MD for:  persistant nausea and vomiting  As directed     Call MD for:  persistant nausea and vomiting  As directed     Call MD for:  redness, tenderness, or signs of  infection (pain, swelling, redness, odor or green/yellow discharge around incision site)  As directed     Call MD for:  redness, tenderness, or signs of infection (pain, swelling, redness, odor or green/yellow discharge around incision site)  As directed     Call MD for:  severe uncontrolled pain  As directed     Call MD for:  severe uncontrolled pain  As directed     Call MD for:  temperature >100.4  As directed     Call MD for:  As directed     Comments:      Temperature > 101.68F    Diet - low sodium heart healthy  As directed     Diet - low sodium heart healthy  As directed     Discharge instructions  As directed     Comments:      Please see discharge instruction sheets.  Also refer to handout given an office.  Please call our office if you have any questions or concerns [phone]    Discharge instructions  As directed     Comments:      May shower. No heavy lifting. Diet as tolerated    Discharge wound care:  As directed     Comments:      If you have closed incisions, shower and bathe over these incisions with soap and water every day.  Remove all surgical dressings on postoperative day #3.  You do not need to replace dressings over the closed incisions unless you feel more comfortable with a Band-Aid covering it.   If you have an open wound that requires packing, please see wound care instructions.  In general, remove all dressings, wash wound with soap and water and then replace with saline moistened gauze.  Do the dressing change at least every day.  Please call our office [phone] if you have further questions.    Driving Restrictions  As directed     Comments:      No driving until off narcotics and can safely swerve away without pain during an emergency    Increase activity slowly  As directed     Comments:      Walk an hour a day.  Use 20-30 minute walks.  When you can walk 30 minutes without difficulty, increase to low impact/moderate activities such as biking,  jogging, swimming, sexual activity..  Eventually can increase to unrestricted activity when not feeling pain.  If you feel pain: STOP!Marland Kitchen   Let pain protect you from overdoing it.  Use ice/heat/over-the-counter pain medications to help minimize his soreness.  Use pain prescriptions as needed to remain active.  It is better to take extra pain medications and be more active than to stay bedridden to avoid all pain medications.    Increase activity slowly  As directed     Lifting restrictions  As directed     Comments:      Avoid heavy lifting initially.  Do not push through pain.  You have no specific weight limit.  Coughing and sneezing or four more stressful to your incision than any lifting you will do. Pain will protect you from injury.  Therefore, avoid intense activity until off all narcotic pain medications.  Coughing and sneezing or four more stressful to your incision than any lifting he will do.    May shower / Bathe  As directed     May walk up steps  As directed     Sexual Activity Restrictions  As directed     Comments:      Sexual activity as tolerated.  Do not push through pain.  Pain will protect you from injury.    Walk with assistance  As directed     Comments:      Walk over an hour a day.  May use a walker/cane/companion to help with balance and stamina.        Medication List    STOP taking these medications       erythromycin 500 MG tablet  Commonly known as:  ERYTHROCIN     neomycin 500 MG tablet  Commonly known as:  MYCIFRADIN      TAKE these medications       cholecalciferol 1000 UNITS tablet  Commonly known as:  VITAMIN D  Take 1 tablet (1,000 Units total) by mouth daily.     PROBIOTIC DAILY PO  Take 1 tablet by mouth daily.           Follow-up Information   Follow up with Katryn Plummer J, MD. Schedule an appointment as soon as possible for a visit in 1 week. (for staple removal)    Contact information:   [address] [place]  [address] [phone]       Signed: Adolph Pollack 05/16/2013, 10:51 AM

## 2013-05-16 NOTE — Progress Notes (Signed)
Presents to office today for new patient appointment with her husband, Caryn Bee. Completed the PATIENT MEASURE OF DISTRESS TOOL and forwarded to GI Navigator. Rates her level of stress 5/10 overall. Feels she will do better once she has all the information she needs. Feels she is making good choices and coping with situation. Seem receptive to patient family services referral to help her deal with changes in her health/function and taking care of her mother and child at home.

## 2013-05-16 NOTE — Telephone Encounter (Signed)
Per staff phone call and POF I have schedueld appts. On appt on 7/21 I  Can't schedule treatment due to another appt with the surgery. Scheduler aware   JMW

## 2013-05-16 NOTE — Progress Notes (Signed)
[facility] New Patient Consult   Referring ZO:XWRU Rosenbower   Priscilla Hill 47 y.o.  Dec 05, 1965    Reason for Referral: colon cancer     HPI: she reports a 2 year history of rectal bleeding.she was referred to Dr. Juanda Hill and was noted to have microcytic anemia and blood in the rectal vault. She was taken to a colonoscopy procedure on 03/29/2013. A pedunculated polyp measuring 12 mm was found in the sigmoid colon. A polypectomy was performed. A fungating ulcerated and a nonobstructing bleeding mass was seen in the sigmoid colon at 20 cm. The mass was biopsied and tattooed.the biopsy from the polypectomy revealed a tubulovillous adenoma. The biopsy from the sigmoid mass revealed adenocarcinoma.  Staging CTs of the abdomen and pelvis on 04/02/2013 revealed an intraluminal density in the mid sigmoid colon. No evidence of pericolonic lymphadenopathy. A 5 cm mass in the right uterine fundus was felt to likely represent a fibroid or focal adenoma EOMI. No lymphadenopathy identified within the abdomen or pelvis. No liver masses. The adrenal glands and kidneys appeared normal.  She was referred to Dr. Abbey Hill and was taken to the operating room on 04/24/2013. He performed a laparoscopic-assisted sigmoid colectomy. Tumor was visualized in the distal sigmoid colon. The polypectomy site and sigmoid tumor were tattooed. The colon was divided at the descending/sigmoid colon she and rectosigmoid junction. A side-to-side anastomosis was performed.  The pathology ([id number]) confirmed a moderately differentiated colorectal adenocarcinoma invading into the muscularis propria. Lymphovascular and perineural invasion were present. The resection margins were negative.  The tumor was located in the sigmoid colon. No macroscopic perforation. 2 of 15 lymph nodes contained metastatic carcinoma.  She has recovered from surgery. She is eating and the bowels are functioning.  Past Medical History   Diagnosis Date  . Hyperlipidemia     mild  . GERD (gastroesophageal reflux disease)   . History of positive PPD     never any symptoms, due to being from [place]? different "vaccine"  . Anxiety     .  G6 P2, one abortion, 3 miscarriages  Past Surgical History  Procedure Laterality Date  . Essure tubal ligation    . Childbirth   04-17-13    x 2 NVD  . Hysteroscopy with resectoscope  04-17-13    "fibroids removed"  . Laparoscopic partial colectomy N/A 04/24/2013    Procedure: LAPAROSCOPIC ASSISTED SIGMOID COLECTOMY;  Surgeon: Priscilla Pollack, MD;  Location: [facility];  Service: General;  Laterality: N/A;   .    Varicose vein "injection "therapy  Family History  Problem Relation Age of Onset  . Hypertension Mother   . Diabetes Mother   . Heart disease Mother   . Stroke Father 73  . Hypertension Father   . Heart disease Maternal Grandmother   . Heart disease Maternal Grandfather   . Stomach cancer  46    Paternal grandmother   .    Lung cancer                                     Paternal grandfather-smoker                                  64  Current outpatient prescriptions:Ascorbic Acid (VITAMIN C) 1000 MG tablet, Take 1,000 mg by mouth daily., Disp: , Rfl: ;  cholecalciferol (VITAMIN D) 1000 UNITS tablet, Take 1 tablet (1,000 Units total) by mouth daily., Disp: 100 tablet, Rfl: 3;  ferrous sulfate 325 (65 FE) MG tablet, Take 325 mg by mouth daily with breakfast., Disp: , Rfl: ;  OVER THE COUNTER MEDICATION, Take 1 capsule by mouth daily. Pancreatic enzyme capsule, Disp: , Rfl:  Probiotic Product (PROBIOTIC DAILY PO), Take 1 tablet by mouth daily., Disp: , Rfl:   Allergies: No Known Allergies  Social History: she lives in [place]. 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 She drinks alcohol occasionally. No tobacco use. No risk factor for HIV or hepatitis.    ROS:   Positives include:"gastritis ", rectal bleeding for 2 years, discomfort with urination  A complete  ROS was otherwise negative.  Physical Exam:  Blood pressure 99/72, pulse 80, temperature 98.6 F (37 C), temperature source Oral, resp. rate 18, height 5\' 5"  (1.651 m), weight 121 lb 11.2 oz (55.203 kg), last menstrual period 04/10/2013.  HEENT: oropharynx without visible mass, neck without mass Lungs: clear bilaterally Cardiac: regular rate and rhythm Abdomen: no hepatosplenomegaly, nontender, no mass, healed surgical incisions  Vascular: no leg edema Lymph nodes: no cervical, supra-clavicular, axillary, or inguinal nodes Neurologic: alert and oriented, the motor exam is intact in the upper and lower extremities Skin: no rash Musculoskeletal: no spine tenderness   LAB:  CBC  Lab Results  Component Value Date   WBC 7.3 04/26/2013   HGB 8.7* 04/26/2013   HCT 28.4* 04/26/2013   MCV 85.5 04/26/2013   PLT 169 04/26/2013     CMP      Component Value Date/Time   NA 139 04/25/2013 0425   K 3.5 04/25/2013 0425   CL 107 04/25/2013 0425   CO2 27 04/25/2013 0425   GLUCOSE 122* 04/25/2013 0425   BUN 5* 04/25/2013 0425   CREATININE 0.68 04/25/2013 0425   CALCIUM 8.3* 04/25/2013 0425   PROT 7.2 04/17/2013 1035   ALBUMIN 4.0 04/17/2013 1035   AST 15 04/17/2013 1035   ALT 13 04/17/2013 1035   ALKPHOS 53 04/17/2013 1035   BILITOT 0.6 04/17/2013 1035   GFRNONAA >90 04/25/2013 0425   GFRAA >90 04/25/2013 0425   CEA on 03/29/2013-less than 0.5  Radiology:as per history of present illness, chest x-ray 04/17/2013-no acute abnormality    Assessment/Plan:   1. 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 Microcytic anemia  3. Dysuria-we obtained a urinalysis today  4. History of uterine fibroids   Disposition:   Priscilla Hill has been diagnosed with stage III colon cancer. I discussed the diagnosis, prognosis, and adjuvant treatment options with Priscilla Hill and her husband. I explained the benefit associated with adjuvant  systemic therapy in this setting. We discussed standard 5-FU and oxaliplatin-based chemotherapy. We also discussed the CALGB 78295 study. We discussed FOLFOX and CAPOX therapy.   She understands the clinical equivalence of the FOLFOX and CAPOX regimens. After discussion with Ms. Phung and her husband we decided to proceed with FOLFOX chemotherapy.  I reviewed the potential toxicities associated with this regimen including the Hill for nausea/vomiting, mucositis, diarrhea, alopecia, and hematologic toxicity. We discussed the skin rash, hyperpigmentation, and hand/foot syndrome associated with 5-fluorouracil. We reviewed the various types of neuropathy seen with oxaliplatin.  Ms. Peltz will return for a chemotherapy teaching class and CBC within the next few days. We obtained a urinalysis today. She declines a staging chest CT.  We will refer her to Dr. Abbey Hill for placement of a Port-A-Cath. She is scheduled for an office visit and a first cycle of FOLFOX on 05/28/2013.  Carlee Vonderhaar 05/16/2013, 6:38 PM

## 2013-05-16 NOTE — Progress Notes (Signed)
Didn't ask if POA/living will. She wants communication via email-she has MyChart. No financial issues and checked in.

## 2013-05-16 NOTE — Progress Notes (Signed)
Met with patient and explained role of nurse navigator.  Educational information provided on colon cancer.  [facility] resource sheet with names and phone numbers provided.  Referral made to dietician for diet education.  Patient denies any barriers to care and states she has a very good support system.  This RN emphasized reasons to call and stressed that we are here to assist with care.  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  She declines a SW referral, but understands she can call for assist if needed.  Will continue to follow.

## 2013-05-17 ENCOUNTER — Telehealth: Payer: Self-pay | Admitting: *Deleted

## 2013-05-17 NOTE — Telephone Encounter (Signed)
Made patient aware her U/A was negative for infection.

## 2013-05-21 ENCOUNTER — Telehealth: Payer: Self-pay | Admitting: Oncology

## 2013-05-21 ENCOUNTER — Ambulatory Visit: Payer: PRIVATE HEALTH INSURANCE | Admitting: Nutrition

## 2013-05-21 NOTE — Progress Notes (Signed)
Patient is a 47 year old female diagnosed with colon cancer.  She is a patient of Dr. Truett Perna.  Past medical history includes hyperlipidemia, GERD, anxiety, and microcytic anemia.  Medications include vitamin C, vitamin D, ferrous sulfate, and probiotics.  Labs include glucose 122, BUN 5, calcium 8.3 on June 4.  Height: 65 inches. Weight: 121.7 pounds on June 25. Usual body weight: 128 pounds per patient. BMI: 20.25.  Patient reports her appetite is fair.  She has never had a great appetite and eats modestly.  Her 6 pound weight loss occurred secondary to colonoscopy and recent surgery.  She denies nutrition side effects at this time.  Nutrition diagnosis: Food and nutrition related knowledge deficit related to new diagnosis of colon cancer and associated treatments as evidenced by no prior need for nutrition related information.  Intervention: Patient was educated to consume small amounts of food more often and to include plenty of protein sources.  Patient was educated on various sources of protein in diet.  She was encouraged to consume plant-based foods, such as fruit, vegetables, and plant based, proteins.  I educated her to maintain weight throughout treatment.  I have reviewed foods that are high in iron and ways to increase iron absorption.    Monitoring, evaluation, goals: Patient will tolerate healthy, plant-based diet to maintain weight throughout treatment.  Next visit: Patient will contact me if she has questions or concerns.

## 2013-05-22 ENCOUNTER — Other Ambulatory Visit (HOSPITAL_BASED_OUTPATIENT_CLINIC_OR_DEPARTMENT_OTHER): Payer: PRIVATE HEALTH INSURANCE | Admitting: Lab

## 2013-05-22 ENCOUNTER — Telehealth: Payer: Self-pay | Admitting: *Deleted

## 2013-05-22 ENCOUNTER — Telehealth (INDEPENDENT_AMBULATORY_CARE_PROVIDER_SITE_OTHER): Payer: Self-pay | Admitting: *Deleted

## 2013-05-22 ENCOUNTER — Other Ambulatory Visit (INDEPENDENT_AMBULATORY_CARE_PROVIDER_SITE_OTHER): Payer: Self-pay | Admitting: General Surgery

## 2013-05-22 ENCOUNTER — Other Ambulatory Visit: Payer: Self-pay | Admitting: *Deleted

## 2013-05-22 ENCOUNTER — Encounter: Payer: Self-pay | Admitting: *Deleted

## 2013-05-22 ENCOUNTER — Other Ambulatory Visit: Payer: PRIVATE HEALTH INSURANCE

## 2013-05-22 DIAGNOSIS — C189 Malignant neoplasm of colon, unspecified: Secondary | ICD-10-CM

## 2013-05-22 DIAGNOSIS — R300 Dysuria: Secondary | ICD-10-CM

## 2013-05-22 LAB — CBC WITH DIFFERENTIAL/PLATELET
Basophils Absolute: 0 10*3/uL (ref 0.0–0.1)
Eosinophils Absolute: 0.1 10*3/uL (ref 0.0–0.5)
HGB: 11.6 g/dL (ref 11.6–15.9)
MCV: 86.7 fL (ref 79.5–101.0)
MONO#: 0.5 10*3/uL (ref 0.1–0.9)
MONO%: 7.8 % (ref 0.0–14.0)
NEUT#: 4.1 10*3/uL (ref 1.5–6.5)
Platelets: 250 10*3/uL (ref 145–400)
RDW: 18 % — ABNORMAL HIGH (ref 11.2–14.5)
WBC: 6.4 10*3/uL (ref 3.9–10.3)

## 2013-05-22 MED ORDER — PROCHLORPERAZINE MALEATE 10 MG PO TABS: 10.0000 mg | ORAL_TABLET | Freq: Four times a day (QID) | ORAL | Status: DC | PRN

## 2013-05-22 MED ORDER — LIDOCAINE-PRILOCAINE 2.5-2.5 % EX CREA: TOPICAL_CREAM | CUTANEOUS | Status: DC | PRN

## 2013-05-22 NOTE — Telephone Encounter (Signed)
Spoke to [place] with surgery scheduling who received orders from Dr. Abbey Chatters to get patient scheduled.  She is working on this now.

## 2013-05-22 NOTE — Telephone Encounter (Signed)
Called Dr. Maris Berger office to schedule port placement. Triage nurse will follow up with Dr. Abbey Chatters to see if port can be placed prior to 05/28/13. Prescription for antiemetic and EMLA sent to pharmacy.

## 2013-05-22 NOTE — Telephone Encounter (Signed)
I placed orders in EPIC for this.

## 2013-05-22 NOTE — Telephone Encounter (Signed)
Archie Patten called to ask about getting a PAC inserted for patient.  Patient's first chemo treatment is 05/28/13.

## 2013-05-23 ENCOUNTER — Encounter (HOSPITAL_COMMUNITY): Payer: Self-pay

## 2013-05-23 ENCOUNTER — Encounter (HOSPITAL_COMMUNITY)
Admission: RE | Admit: 2013-05-23 | Discharge: 2013-05-23 | Disposition: A | Discharge status: Home / Self Care | Payer: PRIVATE HEALTH INSURANCE | Source: Ambulatory Visit | Attending: General Surgery | Admitting: General Surgery

## 2013-05-23 ENCOUNTER — Encounter (HOSPITAL_COMMUNITY): Payer: Self-pay | Admitting: Pharmacy Technician

## 2013-05-23 HISTORY — DX: Malignant (primary) neoplasm, unspecified: C80.1

## 2013-05-23 HISTORY — DX: Anemia, unspecified: D64.9

## 2013-05-23 LAB — COMPREHENSIVE METABOLIC PANEL
AST: 16 U/L (ref 0–37)
Albumin: 3.9 g/dL (ref 3.5–5.2)
BUN: 8 mg/dL (ref 6–23)
Calcium: 9.6 mg/dL (ref 8.4–10.5)
Creatinine, Ser: 0.6 mg/dL (ref 0.50–1.10)
Total Protein: 7.5 g/dL (ref 6.0–8.3)

## 2013-05-23 LAB — PROTIME-INR
INR: 0.94 (ref 0.00–1.49)
Prothrombin Time: 12.4 seconds (ref 11.6–15.2)

## 2013-05-23 LAB — HCG, SERUM, QUALITATIVE: Preg, Serum: NEGATIVE

## 2013-05-23 NOTE — Patient Instructions (Addendum)
Priscilla Hill  05/23/2013   Your procedure is scheduled on:  05/24/13  THURSDAY  Report to [facility] at   0530    AM.  Call this number if you have problems the morning of surgery: [phone]       Remember:   Do not eat food  Or drink :After Midnight. TONIGHT   Take these medicines the morning of surgery with A SIP OF WATER:  NONE   .  Contacts, dentures or partial plates can not be worn to surgery  Leave suitcase in the car. After surgery it may be brought to your room.  For patients admitted to the hospital, checkout time is 11:00 AM day of  discharge.             SPECIAL INSTRUCTIONS- SEE [place] PREPARING FOR SURGERY INSTRUCTION SHEET-     DO NOT WEAR JEWELRY, LOTIONS, POWDERS, OR PERFUMES.  WOMEN-- DO NOT SHAVE LEGS OR UNDERARMS FOR 12 HOURS BEFORE SHOWERS. MEN MAY SHAVE FACE.  Patients discharged the day of surgery will not be allowed to drive home. IF going home the day of surgery, you must have a driver and someone to stay with you for the first 24 hours  Name and phone number of your driver:  Husband  [name]  PST [phone]                 FAILURE TO FOLLOW THESE INSTRUCTIONS MAY RESULT IN  CANCELLATION   OF YOUR SURGERY                                                  Patient Signature _____________________________

## 2013-05-23 NOTE — Progress Notes (Signed)
Chest x ray 5/14 EPIC, Cbc with diff 05/22/13 EPIC

## 2013-05-24 ENCOUNTER — Telehealth: Payer: Self-pay | Admitting: Oncology

## 2013-05-24 ENCOUNTER — Ambulatory Visit (HOSPITAL_COMMUNITY): Payer: PRIVATE HEALTH INSURANCE

## 2013-05-24 ENCOUNTER — Encounter (HOSPITAL_COMMUNITY)
Admission: RE | Disposition: A | Discharge status: Home / Self Care | Payer: Self-pay | Source: Ambulatory Visit | Attending: General Surgery

## 2013-05-24 ENCOUNTER — Encounter (HOSPITAL_COMMUNITY): Payer: Self-pay | Admitting: *Deleted

## 2013-05-24 ENCOUNTER — Ambulatory Visit (HOSPITAL_COMMUNITY)
Admission: RE | Admit: 2013-05-24 | Discharge: 2013-05-24 | Disposition: A | Discharge status: Home / Self Care | Payer: PRIVATE HEALTH INSURANCE | Source: Ambulatory Visit | Attending: General Surgery | Admitting: General Surgery

## 2013-05-24 ENCOUNTER — Ambulatory Visit (HOSPITAL_COMMUNITY): Payer: PRIVATE HEALTH INSURANCE | Admitting: Registered Nurse

## 2013-05-24 ENCOUNTER — Encounter (HOSPITAL_COMMUNITY): Payer: Self-pay | Admitting: Registered Nurse

## 2013-05-24 DIAGNOSIS — E785 Hyperlipidemia, unspecified: Secondary | ICD-10-CM | POA: Insufficient documentation

## 2013-05-24 DIAGNOSIS — C189 Malignant neoplasm of colon, unspecified: Secondary | ICD-10-CM | POA: Insufficient documentation

## 2013-05-24 DIAGNOSIS — Z79899 Other long term (current) drug therapy: Secondary | ICD-10-CM | POA: Insufficient documentation

## 2013-05-24 DIAGNOSIS — K219 Gastro-esophageal reflux disease without esophagitis: Secondary | ICD-10-CM | POA: Insufficient documentation

## 2013-05-24 HISTORY — PX: PORTACATH PLACEMENT: SHX2246

## 2013-05-24 SURGERY — INSERTION, TUNNELED CENTRAL VENOUS DEVICE, WITH PORT
Anesthesia: General | Laterality: Right | Wound class: Clean

## 2013-05-24 MED ORDER — LACTATED RINGERS IV SOLN
INTRAVENOUS | Status: DC | PRN
  Administered 2013-05-24: 07:00:00 via INTRAVENOUS

## 2013-05-24 MED ORDER — LIDOCAINE-EPINEPHRINE 1 %-1:100000 IJ SOLN
INTRAMUSCULAR | Status: DC | PRN
  Administered 2013-05-24: 10 mL

## 2013-05-24 MED ORDER — SODIUM CHLORIDE 0.9 % IR SOLN
Freq: Once | Status: AC
  Administered 2013-05-24: 08:00:00
  Filled 2013-05-24: qty 1.2

## 2013-05-24 MED ORDER — DEXAMETHASONE SODIUM PHOSPHATE 10 MG/ML IJ SOLN
INTRAMUSCULAR | Status: DC | PRN
  Administered 2013-05-24: 10 mg via INTRAVENOUS

## 2013-05-24 MED ORDER — PHENYLEPHRINE HCL 10 MG/ML IJ SOLN
INTRAMUSCULAR | Status: DC | PRN
  Administered 2013-05-24 (×2): 40 ug via INTRAVENOUS

## 2013-05-24 MED ORDER — CEFAZOLIN SODIUM-DEXTROSE 2-3 GM-% IV SOLR
INTRAVENOUS | Status: AC
  Filled 2013-05-24: qty 50

## 2013-05-24 MED ORDER — EPHEDRINE SULFATE 50 MG/ML IJ SOLN
INTRAMUSCULAR | Status: DC | PRN
  Administered 2013-05-24 (×4): 5 mg via INTRAVENOUS

## 2013-05-24 MED ORDER — CEFAZOLIN SODIUM-DEXTROSE 2-3 GM-% IV SOLR
2.0000 g | INTRAVENOUS | Status: AC
  Administered 2013-05-24: 2 g via INTRAVENOUS

## 2013-05-24 MED ORDER — MIDAZOLAM HCL 5 MG/5ML IJ SOLN
INTRAMUSCULAR | Status: DC | PRN
  Administered 2013-05-24: 2 mg via INTRAVENOUS

## 2013-05-24 MED ORDER — PROPOFOL 10 MG/ML IV BOLUS
INTRAVENOUS | Status: DC | PRN
  Administered 2013-05-24: 150 mg via INTRAVENOUS

## 2013-05-24 MED ORDER — LIDOCAINE-EPINEPHRINE 1 %-1:100000 IJ SOLN
INTRAMUSCULAR | Status: AC
  Filled 2013-05-24: qty 1

## 2013-05-24 MED ORDER — FENTANYL CITRATE 0.05 MG/ML IJ SOLN
INTRAMUSCULAR | Status: DC | PRN
  Administered 2013-05-24: 50 ug via INTRAVENOUS

## 2013-05-24 MED ORDER — HEPARIN SOD (PORK) LOCK FLUSH 100 UNIT/ML IV SOLN
INTRAVENOUS | Status: AC
  Filled 2013-05-24: qty 5

## 2013-05-24 MED ORDER — FENTANYL CITRATE 0.05 MG/ML IJ SOLN: 25.0000 ug | INTRAMUSCULAR | Status: DC | PRN

## 2013-05-24 MED ORDER — LIDOCAINE HCL (CARDIAC) 20 MG/ML IV SOLN
INTRAVENOUS | Status: DC | PRN
  Administered 2013-05-24: 60 mg via INTRAVENOUS

## 2013-05-24 MED ORDER — ONDANSETRON HCL 4 MG/2ML IJ SOLN
INTRAMUSCULAR | Status: DC | PRN
  Administered 2013-05-24: 4 mg via INTRAVENOUS

## 2013-05-24 MED ORDER — HEPARIN SOD (PORK) LOCK FLUSH 100 UNIT/ML IV SOLN
INTRAVENOUS | Status: DC | PRN
  Administered 2013-05-24: 500 [IU] via INTRAVENOUS

## 2013-05-24 MED ORDER — PROMETHAZINE HCL 25 MG/ML IJ SOLN: 6.2500 mg | INTRAMUSCULAR | Status: DC | PRN

## 2013-05-24 SURGICAL SUPPLY — 47 items
APL SKNCLS STERI-STRIP NONHPOA (GAUZE/BANDAGES/DRESSINGS) ×1
BAG DECANTER FOR FLEXI CONT (MISCELLANEOUS) ×2 IMPLANT
BENZOIN TINCTURE PRP APPL 2/3 (GAUZE/BANDAGES/DRESSINGS) ×2 IMPLANT
BLADE HEX COATED 2.75 (ELECTRODE) ×2 IMPLANT
BLADE SURG 15 STRL LF DISP TIS (BLADE) ×1 IMPLANT
BLADE SURG 15 STRL SS (BLADE) ×2
BLADE SURG SZ11 CARB STEEL (BLADE) ×2 IMPLANT
CHLORAPREP W/TINT 26ML (MISCELLANEOUS) ×2 IMPLANT
CLOTH BEACON ORANGE TIMEOUT ST (SAFETY) ×2 IMPLANT
COVER PROBE U/S 5X48 (MISCELLANEOUS) ×1 IMPLANT
DECANTER SPIKE VIAL GLASS SM (MISCELLANEOUS) ×2 IMPLANT
DRAPE C-ARM 42X120 X-RAY (DRAPES) ×2 IMPLANT
DRAPE LAPAROTOMY TRNSV 102X78 (DRAPE) ×2 IMPLANT
DRSG TEGADERM 2-3/8X2-3/4 SM (GAUZE/BANDAGES/DRESSINGS) ×2 IMPLANT
DRSG TEGADERM 4X4.75 (GAUZE/BANDAGES/DRESSINGS) ×2 IMPLANT
DRSG TEGADERM 6X8 (GAUZE/BANDAGES/DRESSINGS) ×1 IMPLANT
ELECT REM PT RETURN 9FT ADLT (ELECTROSURGICAL) ×2
ELECTRODE REM PT RTRN 9FT ADLT (ELECTROSURGICAL) ×1 IMPLANT
GAUZE SPONGE 4X4 16PLY XRAY LF (GAUZE/BANDAGES/DRESSINGS) ×2 IMPLANT
GLOVE BIOGEL PI IND STRL 7.0 (GLOVE) ×1 IMPLANT
GLOVE BIOGEL PI INDICATOR 7.0 (GLOVE) ×1
GLOVE ECLIPSE 8.0 STRL XLNG CF (GLOVE) ×2 IMPLANT
GLOVE INDICATOR 8.0 STRL GRN (GLOVE) ×4 IMPLANT
GOWN STRL NON-REIN LRG LVL3 (GOWN DISPOSABLE) ×2 IMPLANT
GOWN STRL REIN XL XLG (GOWN DISPOSABLE) ×4 IMPLANT
KIT BARDPORT ISP (Port) IMPLANT
KIT BARDPORT ISP 9.6FR (PORTABLE EQUIPMENT SUPPLIES) IMPLANT
KIT BASIN OR (CUSTOM PROCEDURE TRAY) ×2 IMPLANT
KIT PORT POWER 8FR ISP CVUE (Catheter) ×1 IMPLANT
NDL HYPO 25X1 1.5 SAFETY (NEEDLE) ×1 IMPLANT
NEEDLE HYPO 22GX1.5 SAFETY (NEEDLE) IMPLANT
NEEDLE HYPO 25X1 1.5 SAFETY (NEEDLE) ×2 IMPLANT
NS IRRIG 1000ML POUR BTL (IV SOLUTION) ×2 IMPLANT
PACK BASIC VI WITH GOWN DISP (CUSTOM PROCEDURE TRAY) ×2 IMPLANT
PENCIL BUTTON HOLSTER BLD 10FT (ELECTRODE) ×2 IMPLANT
SPONGE GAUZE 4X4 12PLY (GAUZE/BANDAGES/DRESSINGS) ×2 IMPLANT
STRIP CLOSURE SKIN 1/2X4 (GAUZE/BANDAGES/DRESSINGS) ×2 IMPLANT
SUT MNCRL AB 4-0 PS2 18 (SUTURE) ×2 IMPLANT
SUT VIC AB 2-0 SH 18 (SUTURE) ×1 IMPLANT
SUT VIC AB 2-0 SH 27 (SUTURE) ×2
SUT VIC AB 2-0 SH 27X BRD (SUTURE) IMPLANT
SUT VIC AB 3-0 SH 27 (SUTURE)
SUT VIC AB 3-0 SH 27XBRD (SUTURE) IMPLANT
SYR CONTROL 10ML LL (SYRINGE) ×2 IMPLANT
SYRINGE 10CC LL (SYRINGE) ×2 IMPLANT
TOWEL OR 17X26 10 PK STRL BLUE (TOWEL DISPOSABLE) ×2 IMPLANT
TOWEL OR NON WOVEN STRL DISP B (DISPOSABLE) ×1 IMPLANT

## 2013-05-24 NOTE — Op Note (Signed)
Preoperative diagnosis:  Stage III colon cancer  Postoperative diagnosis:  Same  Procedure: Ultrasound-guided Port-A-Cath insertion into right internal jugular vein under fluoroscopy.  Surgeon: Avel Peace M.D.  Anesthesia: Local (Xylocaine) with General via LMA.  Indication:  This is a 47 year old female with stage III colon cancer.  She needs long term venous access for chemotherapy.  Technique: she was brought to the operating room, placed supine on the operating table, and general anesthesia was given. A roll was placed under the back and the arms were tucked.  The upper chest wall and neck were sterilely prepped and draped.  Her head was rotated to the left. Using the ultrasound, the right internal jugular vein was identified. Local anesthetic was infiltrated in the skin and subcutaneous tissue just anterior to it. A 16-gauge needle was used to cannulate the right internal jugular vein under ultrasound guidance. A wire was then threaded through the needle into the internal jugular vein and down into the right heart under ultrasound and fluoroscopic guidance.  Local anesthetic was infiltrated into the right upper chest wall. A right upper chest wall incision was made and a pocket was created for the Portacath.  An incision was made around the wire in the neck. The catheter was then tunneled from the chest wall incision up through the neck incision.  A dilator- introducer complex was placed over the wire into the superior vena cava. The dilator and wire were then removed and the catheter was threaded through the peel-away sheath introducer into the right heart. The introducer was then peeled away and removed. Under fluoroscopic guidance, the tip of the catheter was then pulled back until it was at the junction of the superior vena cava and right atrium. The catheter was then connected to the port.  The port aspirated blood and flushed easily.  The port was then anchored to the chest wall  with 2-0 Vicryl suture. Concentrated heparin solution was then placed into the port. The port and catheter position were then verified using fluoroscopy. The subcutaneous tissue was then closed over the port with running 3-0 Vicryl suture. The skin incisions were then closed with 4-0 Monocryl subcuticular stitches. Steri-Strips and sterile dressings were applied.  The procedure was well-tolerated without any apparent complications. She was taken to the recovery room in satisfactory condition where a portable chest x-ray is pending.

## 2013-05-24 NOTE — Anesthesia Postprocedure Evaluation (Signed)
  Anesthesia Post-op Note  Patient: AMRIT ERCK  Procedure(s) Performed: Procedure(s) (LRB): INSERTION PORT-A-CATH (Right)  Patient Location: PACU  Anesthesia Type: General  Level of Consciousness: awake and alert   Airway and Oxygen Therapy: Patient Spontanous Breathing  Post-op Pain: mild  Post-op Assessment: Post-op Vital signs reviewed, Patient's Cardiovascular Status Stable, Respiratory Function Stable, Patent Airway and No signs of Nausea or vomiting  Last Vitals:  Filed Vitals:   05/24/13 0845  BP: 129/69  Pulse: 91  Temp:   Resp: 12    Post-op Vital Signs: stable   Complications: No apparent anesthesia complications

## 2013-05-24 NOTE — Preoperative (Signed)
Beta Blockers   Reason not to administer Beta Blockers:Not Applicable 

## 2013-05-24 NOTE — Interval H&P Note (Signed)
History and Physical Interval Note:  05/24/2013 7:29 AM  Priscilla Hill  has presented today for surgery, with the diagnosis of colon cancer  The various methods of treatment have been discussed with the patient and family. After consideration of risks, benefits and other options for treatment, the patient has consented to  Procedure(s): INSERTION PORT-A-CATH (N/A) as a surgical intervention .  The patient's history has been reviewed, patient examined, no change in status, stable for surgery.  I have reviewed the patient's chart and labs.  Questions were answered to the patient's satisfaction.     Priscilla Hill Shela Commons

## 2013-05-24 NOTE — Transfer of Care (Signed)
Immediate Anesthesia Transfer of Care Note  Patient: Priscilla Hill  Procedure(s) Performed: Procedure(s): INSERTION PORT-A-CATH (Right)  Patient Location: PACU  Anesthesia Type:General  Level of Consciousness: awake, alert , oriented and patient cooperative  Airway & Oxygen Therapy: Patient Spontanous Breathing and Patient connected to face mask oxygen  Post-op Assessment: Report given to PACU RN, Post -op Vital signs reviewed and stable and Patient moving all extremities  Post vital signs: Reviewed and stable  Complications: No apparent anesthesia complications

## 2013-05-24 NOTE — H&P (Signed)
Priscilla Hill is an 47 y.o. female.   Chief Complaint:   Here for Port-a-cath insertion HPI: She has stage III colon cancer and needs long term venous access.  Past Medical History  Diagnosis Date  . Hyperlipidemia     mild  . GERD (gastroesophageal reflux disease)   . History of positive PPD     never any symptoms, due to being from [place]? different "vaccine"  . Anxiety   . Anemia   . Cancer     Past Surgical History  Procedure Laterality Date  . Essure tubal ligation    . Childbirth   04-17-13    x 2 NVD  . Hysteroscopy with resectoscope  04-17-13    "fibroids removed"  . Laparoscopic partial colectomy N/A 04/24/2013    Procedure: LAPAROSCOPIC ASSISTED SIGMOID COLECTOMY;  Surgeon: Adolph Pollack, MD;  Location: [facility];  Service: General;  Laterality: N/A;    Family History  Problem Relation Age of Onset  . Hypertension Mother   . Diabetes Mother   . Heart disease Mother   . Stroke Father 43  . Hypertension Father   . Heart disease Maternal Grandmother   . Heart disease Maternal Grandfather   . Stomach cancer      grandmother   Social History:  reports that she has never smoked. She has never used smokeless tobacco. She reports that  drinks alcohol. She reports that she does not use illicit drugs.  Allergies: No Known Allergies  Medications Prior to Admission  Medication Sig Dispense Refill  . Ascorbic Acid (VITAMIN C) 1000 MG tablet Take 1,000 mg by mouth daily.      . cholecalciferol (VITAMIN D) 1000 UNITS tablet Take 1 tablet (1,000 Units total) by mouth daily.  100 tablet  3  . ferrous sulfate 325 (65 FE) MG tablet Take 325 mg by mouth daily with breakfast.      . Polyethyl Glycol-Propyl Glycol (SYSTANE) 0.4-0.3 % SOLN Apply 1 drop to eye daily as needed (for dry eyes).      . Probiotic Product (PROBIOTIC DAILY PO) Take 1 tablet by mouth daily.      Marland Kitchen lidocaine-prilocaine (EMLA) cream Apply topically as needed.  30 g  1    Results for orders placed during the  hospital encounter of 05/23/13 (from the past 48 hour(s))  COMPREHENSIVE METABOLIC PANEL     Status: None   Collection Time    05/23/13 11:05 AM      Result Value Range   Sodium 140  135 - 145 mEq/L   Potassium 3.8  3.5 - 5.1 mEq/L   Chloride 103  96 - 112 mEq/L   CO2 29  19 - 32 mEq/L   Glucose, Bld 89  70 - 99 mg/dL   BUN 8  6 - 23 mg/dL   Creatinine, Ser 1.61  0.50 - 1.10 mg/dL   Calcium 9.6  8.4 - 09.6 mg/dL   Total Protein 7.5  6.0 - 8.3 g/dL   Albumin 3.9  3.5 - 5.2 g/dL   AST 16  0 - 37 U/L   ALT 19  0 - 35 U/L   Alkaline Phosphatase 59  39 - 117 U/L   Total Bilirubin 0.5  0.3 - 1.2 mg/dL   GFR calc non Af Amer >90  >90 mL/min   GFR calc Af Amer >90  >90 mL/min   Comment:            The eGFR has been calculated  using the CKD EPI equation.     This calculation has not been     validated in all clinical     situations.     eGFR's persistently     <90 mL/min signify     possible Chronic Kidney Disease.  PROTIME-INR     Status: None   Collection Time    05/23/13 11:05 AM      Result Value Range   Prothrombin Time 12.4  11.6 - 15.2 seconds   INR 0.94  0.00 - 1.49  HCG, SERUM, QUALITATIVE     Status: None   Collection Time    05/23/13 11:05 AM      Result Value Range   Preg, Serum NEGATIVE  NEGATIVE   Comment:            THE SENSITIVITY OF THIS     METHODOLOGY IS >10 mIU/mL.   No results found.  Review of Systems  Constitutional: Negative for fever and chills.  Gastrointestinal: Negative for abdominal pain and diarrhea.    Blood pressure 105/70, pulse 80, temperature 98.4 F (36.9 C), temperature source Oral, resp. rate 18, last menstrual period 05/23/2013, SpO2 100.00%. Physical Exam  Constitutional: She appears well-developed and well-nourished. No distress.  HENT:  Head: Normocephalic and atraumatic.  Cardiovascular: Normal rate and regular rhythm.   Respiratory: Effort normal and breath sounds normal.  GI: Soft. There is no tenderness.  Incisions  are clean and intact  Musculoskeletal: She exhibits no edema.     Assessment/Plan Stage III colon cancer  Plan:  US guided Port-a-cath insertion.  The procedure risks and aftercare been explained. Risks include but are not limited to bleeding, infection, malfunction, pneumothorax, wound problems, DVT.  Trell Secrist J 05/24/2013, 7:27 AM

## 2013-05-24 NOTE — Telephone Encounter (Signed)
tx appt for 7/7 cannot be adjusted. Per desk nurse BS will see pt in tx area 9:45am. F/u moved to 9:45am - tx remains 8am. Per desk nurse no lb needed 7/7. lmonvm for pt confirming appt for 7/7 @ 8am. Pt to get new schedule when she comes in.

## 2013-05-24 NOTE — Anesthesia Preprocedure Evaluation (Addendum)
Anesthesia Evaluation  Patient identified by MRN, date of birth, ID band Patient awake    Reviewed: Allergy & Precautions, H&P , NPO status , Patient's Chart, lab work & pertinent test results  History of Anesthesia Complications (+) MALIGNANT HYPERTHERMIA  Airway Mallampati: II TM Distance: >3 FB Neck ROM: Full    Dental no notable dental hx.    Pulmonary neg pulmonary ROS,  breath sounds clear to auscultation  Pulmonary exam normal       Cardiovascular negative cardio ROS  Rhythm:Regular Rate:Normal     Neuro/Psych negative neurological ROS  negative psych ROS   GI/Hepatic Neg liver ROS, GERD-  Medicated,  Endo/Other  negative endocrine ROS  Renal/GU negative Renal ROS  negative genitourinary   Musculoskeletal negative musculoskeletal ROS (+)   Abdominal   Peds negative pediatric ROS (+)  Hematology negative hematology ROS (+)   Anesthesia Other Findings   Reproductive/Obstetrics negative OB ROS                          Anesthesia Physical Anesthesia Plan  ASA: II  Anesthesia Plan: General   Post-op Pain Management:    Induction: Intravenous  Airway Management Planned: LMA  Additional Equipment:   Intra-op Plan:   Post-operative Plan:   Informed Consent: I have reviewed the patients History and Physical, chart, labs and discussed the procedure including the risks, benefits and alternatives for the proposed anesthesia with the patient or authorized representative who has indicated his/her understanding and acceptance.   Dental advisory given  Plan Discussed with: CRNA and Surgeon  Anesthesia Plan Comments:         Anesthesia Quick Evaluation

## 2013-05-27 ENCOUNTER — Other Ambulatory Visit: Payer: Self-pay | Admitting: Oncology

## 2013-05-28 ENCOUNTER — Encounter (HOSPITAL_COMMUNITY): Payer: Self-pay | Admitting: General Surgery

## 2013-05-28 ENCOUNTER — Ambulatory Visit (HOSPITAL_BASED_OUTPATIENT_CLINIC_OR_DEPARTMENT_OTHER): Payer: PRIVATE HEALTH INSURANCE | Admitting: Oncology

## 2013-05-28 ENCOUNTER — Ambulatory Visit (HOSPITAL_BASED_OUTPATIENT_CLINIC_OR_DEPARTMENT_OTHER): Payer: PRIVATE HEALTH INSURANCE

## 2013-05-28 VITALS — BP 96/70 | HR 81 | Temp 98.5°F | Resp 20

## 2013-05-28 DIAGNOSIS — Z5111 Encounter for antineoplastic chemotherapy: Secondary | ICD-10-CM

## 2013-05-28 DIAGNOSIS — C187 Malignant neoplasm of sigmoid colon: Secondary | ICD-10-CM

## 2013-05-28 DIAGNOSIS — C189 Malignant neoplasm of colon, unspecified: Secondary | ICD-10-CM

## 2013-05-28 DIAGNOSIS — D509 Iron deficiency anemia, unspecified: Secondary | ICD-10-CM

## 2013-05-28 MED ORDER — LEUCOVORIN CALCIUM INJECTION 350 MG
405.0000 mg/m2 | Freq: Once | INTRAVENOUS | Status: AC
  Administered 2013-05-28: 640 mg via INTRAVENOUS
  Filled 2013-05-28: qty 32

## 2013-05-28 MED ORDER — FLUOROURACIL CHEMO INJECTION 2.5 GM/50ML
400.0000 mg/m2 | Freq: Once | INTRAVENOUS | Status: AC
  Administered 2013-05-28: 650 mg via INTRAVENOUS
  Filled 2013-05-28: qty 13

## 2013-05-28 MED ORDER — DEXAMETHASONE SODIUM PHOSPHATE 10 MG/ML IJ SOLN
10.0000 mg | Freq: Once | INTRAMUSCULAR | Status: AC
  Administered 2013-05-28: 10 mg via INTRAVENOUS

## 2013-05-28 MED ORDER — ONDANSETRON 8 MG/50ML IVPB (CHCC)
8.0000 mg | Freq: Once | INTRAVENOUS | Status: AC
  Administered 2013-05-28: 8 mg via INTRAVENOUS

## 2013-05-28 MED ORDER — SODIUM CHLORIDE 0.9 % IV SOLN
2400.0000 mg/m2 | INTRAVENOUS | Status: DC
  Administered 2013-05-28: 3800 mg via INTRAVENOUS
  Filled 2013-05-28: qty 76

## 2013-05-28 MED ORDER — OXALIPLATIN CHEMO INJECTION 100 MG/20ML
85.0000 mg/m2 | Freq: Once | INTRAVENOUS | Status: AC
  Administered 2013-05-28: 135 mg via INTRAVENOUS
  Filled 2013-05-28: qty 27

## 2013-05-28 MED ORDER — DEXTROSE 5 % IV SOLN
Freq: Once | INTRAVENOUS | Status: AC
  Administered 2013-05-28: 09:00:00 via INTRAVENOUS

## 2013-05-28 MED ORDER — PROCHLORPERAZINE MALEATE 10 MG PO TABS: 10.0000 mg | ORAL_TABLET | Freq: Four times a day (QID) | ORAL | Status: DC | PRN

## 2013-05-28 NOTE — Patient Instructions (Addendum)
[facility] Discharge Instructions for Patients Receiving Chemotherapy  Today you received the following chemotherapy agents :  Oxaliplatin,  Leucovorin, 5FU  To help prevent nausea and vomiting after your treatment, we encourage you to take your nausea medication as instructed by your physician.  DO NOT Eat greasy nor spicy foods.  NO COLD Foods or Drinks for approximately 5 days.  DO Drink Lots of fluids as tolerated.  Take Compazine 10 mg every 6 hrs as needed for nausea.   If you develop nausea and vomiting that is not controlled by your nausea medication, call the clinic.   BELOW ARE SYMPTOMS THAT SHOULD BE REPORTED IMMEDIATELY:  *FEVER GREATER THAN 100.5 F  *CHILLS WITH OR WITHOUT FEVER  NAUSEA AND VOMITING THAT IS NOT CONTROLLED WITH YOUR NAUSEA MEDICATION  *UNUSUAL SHORTNESS OF BREATH  *UNUSUAL BRUISING OR BLEEDING  TENDERNESS IN MOUTH AND THROAT WITH OR WITHOUT PRESENCE OF ULCERS  *URINARY PROBLEMS  *BOWEL PROBLEMS  UNUSUAL RASH Items with * indicate a potential emergency and should be followed up as soon as possible.  Feel free to call the clinic you have any questions or concerns. The clinic phone number is [phone].

## 2013-05-28 NOTE — Progress Notes (Signed)
   [facility]    OFFICE PROGRESS NOTE   INTERVAL HISTORY:   Ms. Coonradt returns as scheduled. She has attended a chemotherapy teaching class. She underwent placement of a Port-A-Cath by Dr. Abbey Chatters on 05/24/2013.  Objective:  Vital signs in last 24 hours:   Resp: Lungs clear bilaterally, no respiratory distress Cardio: Regular rate and rhythm GI: Healed surgical incision Vascular: No leg edema  Portacath/PICC-without erythema  Lab Results:  Lab Results  Component Value Date   WBC 6.4 05/22/2013   HGB 11.6 05/22/2013   HCT 35.7 05/22/2013   MCV 86.7 05/22/2013   PLT 250 05/22/2013   ANC 4.1    Medications: I have reviewed the patient's current medications.  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 Microcytic anemia -improved 3. Port-A-Cath placement 05/24/2013 4. History of uterine fibroids   Disposition:  She is scheduled for a first cycle of FOLFOX today. Ms. Okubo will return for an office visit and cycle 2 in 2 weeks.   Thornton Papas, MD  05/28/2013  6:57 PM

## 2013-05-29 ENCOUNTER — Telehealth: Payer: Self-pay | Admitting: *Deleted

## 2013-05-29 ENCOUNTER — Inpatient Hospital Stay: Payer: PRIVATE HEALTH INSURANCE

## 2013-05-29 NOTE — Telephone Encounter (Signed)
Message copied by Augusto Garbe on Tue May 29, 2013  5:50 PM ------      Message from: Normajean Baxter LE      Created: Mon May 28, 2013  4:04 PM      Regarding: Chemo f/u call       First time FOLFOX, Dr. Cleotis Nipper, TB, RN ------

## 2013-05-29 NOTE — Telephone Encounter (Signed)
Priscilla Hill is doing well.  Reports having some nausea, taking anti-emetic and no emesis.  Denies any problems drinking or esting.  No problem with her bowels or bladder.  Denies any questions at this time.  Asked that she call if any changes.

## 2013-05-30 ENCOUNTER — Other Ambulatory Visit: Payer: Self-pay | Admitting: *Deleted

## 2013-05-30 ENCOUNTER — Telehealth: Payer: Self-pay | Admitting: *Deleted

## 2013-05-30 ENCOUNTER — Ambulatory Visit (HOSPITAL_BASED_OUTPATIENT_CLINIC_OR_DEPARTMENT_OTHER): Payer: PRIVATE HEALTH INSURANCE

## 2013-05-30 VITALS — BP 99/67 | HR 85 | Temp 99.6°F | Resp 20

## 2013-05-30 DIAGNOSIS — C189 Malignant neoplasm of colon, unspecified: Secondary | ICD-10-CM

## 2013-05-30 DIAGNOSIS — Z452 Encounter for adjustment and management of vascular access device: Secondary | ICD-10-CM

## 2013-05-30 DIAGNOSIS — C187 Malignant neoplasm of sigmoid colon: Secondary | ICD-10-CM

## 2013-05-30 MED ORDER — SODIUM CHLORIDE 0.9 % IJ SOLN
10.0000 mL | INTRAMUSCULAR | Status: DC | PRN
  Administered 2013-05-30: 10 mL
  Filled 2013-05-30: qty 10

## 2013-05-30 MED ORDER — HEPARIN SOD (PORK) LOCK FLUSH 100 UNIT/ML IV SOLN
500.0000 [IU] | Freq: Once | INTRAVENOUS | Status: AC | PRN
  Administered 2013-05-30: 500 [IU]
  Filled 2013-05-30: qty 5

## 2013-05-30 MED ORDER — ONDANSETRON HCL 8 MG PO TABS: 8.0000 mg | ORAL_TABLET | Freq: Three times a day (TID) | ORAL | Status: DC | PRN

## 2013-05-30 NOTE — Telephone Encounter (Signed)
Per staff message and POF I have scheduled appts.  JMW  

## 2013-05-30 NOTE — Telephone Encounter (Signed)
Lm gvappt d/t for 06/25/13 for labs @ 9:45am and ov@ 10:15am. Made pt aware that her folfox will follow after her ov. i also made her aware that she will be scheduled for a pump d/c on 06/27/13. i emailed MW to get these things scheduled. i will mail a letter/avs as well...td

## 2013-05-30 NOTE — Progress Notes (Signed)
bolused 1.7 mL.  Pt c/o nausea not controlled by compazine.  Desk nurse notified. Disability form provided by patient and given to desk nurse.

## 2013-05-30 NOTE — Telephone Encounter (Signed)
Message from infusion room nurse, Compazine ineffective for nausea. Zofran Rx sent to pharmacy per Dr. Truett Perna. Called pt with instructions. She voiced understanding.

## 2013-05-30 NOTE — Patient Instructions (Signed)
Call MD for problems 

## 2013-06-01 ENCOUNTER — Encounter: Payer: Self-pay | Admitting: Oncology

## 2013-06-01 ENCOUNTER — Ambulatory Visit: Payer: PRIVATE HEALTH INSURANCE | Admitting: Oncology

## 2013-06-01 NOTE — Progress Notes (Signed)
Faxed clinical information to [facility] attention Ascencion Dike @ [phone].

## 2013-06-04 ENCOUNTER — Telehealth: Payer: Self-pay | Admitting: *Deleted

## 2013-06-04 DIAGNOSIS — C189 Malignant neoplasm of colon, unspecified: Secondary | ICD-10-CM

## 2013-06-04 MED ORDER — LOPERAMIDE HCL 2 MG PO CAPS: 2.0000 mg | ORAL_CAPSULE | Freq: Four times a day (QID) | ORAL | Status: DC | PRN

## 2013-06-04 NOTE — Addendum Note (Signed)
Addended by: Wandalee Ferdinand on: 06/04/2013 09:58 AM   Modules accepted: Orders

## 2013-06-04 NOTE — Telephone Encounter (Signed)
Called to report diarrhea started last night and continues today despite taking Pepto Bismol. Instructed her to stop Pepto and begin Imodium OTC #2 now and #2 qid as needed. Call back tomorrow if diarrhea not resolved. Stop dairy products and avoid meats/spicy foods and high fiber foods. Push clear liquids and try clear juices or sports drinks to rehydrate. Advance diet to bland slowly as diarrhea resolves. Discussed local skin care to rectal area to limit irritation. She agrees to above measures and will call tomorrow if not resolved. Reports she is able to keep food/fluids down without difficulty.

## 2013-06-10 ENCOUNTER — Other Ambulatory Visit: Payer: Self-pay | Admitting: Oncology

## 2013-06-11 ENCOUNTER — Other Ambulatory Visit (HOSPITAL_BASED_OUTPATIENT_CLINIC_OR_DEPARTMENT_OTHER): Payer: PRIVATE HEALTH INSURANCE | Admitting: Lab

## 2013-06-11 ENCOUNTER — Other Ambulatory Visit: Payer: Self-pay | Admitting: Medical Oncology

## 2013-06-11 ENCOUNTER — Ambulatory Visit (HOSPITAL_BASED_OUTPATIENT_CLINIC_OR_DEPARTMENT_OTHER): Payer: PRIVATE HEALTH INSURANCE

## 2013-06-11 ENCOUNTER — Encounter (INDEPENDENT_AMBULATORY_CARE_PROVIDER_SITE_OTHER): Payer: PRIVATE HEALTH INSURANCE | Admitting: General Surgery

## 2013-06-11 ENCOUNTER — Ambulatory Visit (HOSPITAL_BASED_OUTPATIENT_CLINIC_OR_DEPARTMENT_OTHER): Payer: PRIVATE HEALTH INSURANCE | Admitting: Oncology

## 2013-06-11 ENCOUNTER — Telehealth: Payer: Self-pay | Admitting: Medical Oncology

## 2013-06-11 ENCOUNTER — Telehealth: Payer: Self-pay | Admitting: *Deleted

## 2013-06-11 ENCOUNTER — Telehealth: Payer: Self-pay | Admitting: Oncology

## 2013-06-11 VITALS — BP 97/66 | HR 71 | Temp 98.5°F | Resp 18 | Ht 65.0 in | Wt 122.5 lb

## 2013-06-11 DIAGNOSIS — C187 Malignant neoplasm of sigmoid colon: Secondary | ICD-10-CM

## 2013-06-11 DIAGNOSIS — Z5111 Encounter for antineoplastic chemotherapy: Secondary | ICD-10-CM

## 2013-06-11 DIAGNOSIS — D509 Iron deficiency anemia, unspecified: Secondary | ICD-10-CM

## 2013-06-11 DIAGNOSIS — C189 Malignant neoplasm of colon, unspecified: Secondary | ICD-10-CM

## 2013-06-11 DIAGNOSIS — R300 Dysuria: Secondary | ICD-10-CM

## 2013-06-11 LAB — CBC WITH DIFFERENTIAL/PLATELET
BASO%: 0.7 % (ref 0.0–2.0)
EOS%: 1.8 % (ref 0.0–7.0)
HCT: 34.1 % — ABNORMAL LOW (ref 34.8–46.6)
LYMPH%: 27.8 % (ref 14.0–49.7)
MCH: 28.7 pg (ref 25.1–34.0)
MCHC: 33.4 g/dL (ref 31.5–36.0)
MONO%: 12.4 % (ref 0.0–14.0)
NEUT%: 57.3 % (ref 38.4–76.8)
Platelets: 226 10*3/uL (ref 145–400)

## 2013-06-11 LAB — COMPREHENSIVE METABOLIC PANEL (CC13)
ALT: 17 U/L (ref 0–55)
AST: 16 U/L (ref 5–34)
Creatinine: 0.7 mg/dL (ref 0.6–1.1)
Total Bilirubin: 0.42 mg/dL (ref 0.20–1.20)

## 2013-06-11 MED ORDER — LORAZEPAM 0.5 MG PO TABS: 0.5000 mg | ORAL_TABLET | Freq: Four times a day (QID) | ORAL | Status: DC | PRN

## 2013-06-11 MED ORDER — LEUCOVORIN CALCIUM INJECTION 350 MG
400.0000 mg/m2 | Freq: Once | INTRAVENOUS | Status: AC
  Administered 2013-06-11: 632 mg via INTRAVENOUS
  Filled 2013-06-11: qty 31.6

## 2013-06-11 MED ORDER — PALONOSETRON HCL INJECTION 0.25 MG/5ML
0.2500 mg | Freq: Once | INTRAVENOUS | Status: AC
  Administered 2013-06-11: 0.25 mg via INTRAVENOUS

## 2013-06-11 MED ORDER — DEXTROSE 5 % IV SOLN
Freq: Once | INTRAVENOUS | Status: AC
  Administered 2013-06-11: 12:00:00 via INTRAVENOUS

## 2013-06-11 MED ORDER — SODIUM CHLORIDE 0.9 % IV SOLN
2400.0000 mg/m2 | INTRAVENOUS | Status: DC
  Administered 2013-06-11: 3800 mg via INTRAVENOUS
  Filled 2013-06-11: qty 76

## 2013-06-11 MED ORDER — DEXAMETHASONE SODIUM PHOSPHATE 10 MG/ML IJ SOLN
10.0000 mg | Freq: Once | INTRAMUSCULAR | Status: AC
  Administered 2013-06-11: 10 mg via INTRAVENOUS

## 2013-06-11 MED ORDER — OXALIPLATIN CHEMO INJECTION 100 MG/20ML
85.0000 mg/m2 | Freq: Once | INTRAVENOUS | Status: AC
  Administered 2013-06-11: 135 mg via INTRAVENOUS
  Filled 2013-06-11: qty 27

## 2013-06-11 MED ORDER — DEXAMETHASONE 4 MG PO TABS: 4.0000 mg | ORAL_TABLET | Freq: Two times a day (BID) | ORAL | Status: DC

## 2013-06-11 MED ORDER — FLUOROURACIL CHEMO INJECTION 2.5 GM/50ML
400.0000 mg/m2 | Freq: Once | INTRAVENOUS | Status: AC
  Administered 2013-06-11: 650 mg via INTRAVENOUS
  Filled 2013-06-11: qty 13

## 2013-06-11 NOTE — Progress Notes (Signed)
   [facility]    OFFICE PROGRESS NOTE   INTERVAL HISTORY:   She completed a first cycle of FOLFOX on 05/28/2013. She had jaw pain following chemotherapy. No peripheral neuropathy symptoms. She developed diarrhea on day 6. This was relieved with Imodium. Nausea lasted for proximal thigh 6 days following chemotherapy. No emesis. No significant relief with Zofran or Compazine. She reports eating well over the past week.  Objective:  Vital signs in last 24 hours:  Blood pressure 97/66, pulse 71, temperature 98.5 F (36.9 C), temperature source Oral, resp. rate 18, height 5\' 5"  (1.651 m), weight 122 lb 8 oz (55.566 kg), last menstrual period 05/23/2013.    HEENT: No thrush or ulcers Resp: Lungs clear bilaterally Cardio: Regular rate and rhythm GI: Soft, healed incision,? Hematoma/seroma superior to the incision, no hepatomegaly Vascular: No leg edema  Skin: Palms without erythema   Portacath/PICC-without erythema  Lab Results:  Lab Results  Component Value Date   WBC 5.1 06/11/2013   HGB 11.4* 06/11/2013   HCT 34.1* 06/11/2013   MCV 86.0 06/11/2013   PLT 226 06/11/2013   ANC 3.0    Medications: I have reviewed the patient's current medications.  Assessment/Plan: 1. 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 Microcytic anemia -improved  3. Port-A-Cath placement 05/24/2013  4. History of uterine fibroids  5. Nausea following cycle 1 FOLFOX-Aloxi and prophylactic Decadron will be added with cycle 2   Disposition:  She completed a first cycle of FOLFOX on 05/28/2013. The chemotherapy was complicated by prolonged nausea. We will add Aloxi and prophylactic Decadron beginning with cycle 2 today. She will also be prescribed Ativan to use as needed for nausea. Ms. Dupuis will let us know if she has nausea despite these changes. She  will return for an office visit and cycle 3 in 2 weeks.   Thornton Papas, MD  06/11/2013  12:05 PM

## 2013-06-11 NOTE — Telephone Encounter (Signed)
Per staff message and POF I have scheduled appts.  JMW  

## 2013-06-11 NOTE — Progress Notes (Signed)
Patient was discharged, ambulatory to her home with spouse at 65.  No distress.  Asked about a new anti-emetic.  Collaborative nurse notified.

## 2013-06-11 NOTE — Telephone Encounter (Signed)
Gave pt mappt for lab and MD, emailed Marcelino Duster regarding chemop for August 2014

## 2013-06-11 NOTE — Telephone Encounter (Signed)
Called in antiemetics .

## 2013-06-11 NOTE — Patient Instructions (Signed)
[facility] Discharge Instructions for Patients Receiving Chemotherapy  Today you received the following chemotherapy agents oxaliplatin, 5FU and leucovorin  To help prevent nausea and vomiting after your treatment, we encourage you to take your nausea medication zofran 8 mg and compazine 10 mg.  If you develop nausea and vomiting that is not controlled by your nausea medication, call the clinic.   BELOW ARE SYMPTOMS THAT SHOULD BE REPORTED IMMEDIATELY:  *FEVER GREATER THAN 100.5 F  *CHILLS WITH OR WITHOUT FEVER  NAUSEA AND VOMITING THAT IS NOT CONTROLLED WITH YOUR NAUSEA MEDICATION  *UNUSUAL SHORTNESS OF BREATH  *UNUSUAL BRUISING OR BLEEDING  TENDERNESS IN MOUTH AND THROAT WITH OR WITHOUT PRESENCE OF ULCERS  *URINARY PROBLEMS  *BOWEL PROBLEMS  UNUSUAL RASH Items with * indicate a potential emergency and should be followed up as soon as possible.  Feel free to call the clinic you have any questions or concerns. The clinic phone number is [phone].

## 2013-06-13 ENCOUNTER — Ambulatory Visit (HOSPITAL_BASED_OUTPATIENT_CLINIC_OR_DEPARTMENT_OTHER): Payer: PRIVATE HEALTH INSURANCE

## 2013-06-13 VITALS — BP 101/67 | HR 61 | Temp 99.2°F | Resp 20

## 2013-06-13 DIAGNOSIS — Z452 Encounter for adjustment and management of vascular access device: Secondary | ICD-10-CM

## 2013-06-13 DIAGNOSIS — C189 Malignant neoplasm of colon, unspecified: Secondary | ICD-10-CM

## 2013-06-13 DIAGNOSIS — C187 Malignant neoplasm of sigmoid colon: Secondary | ICD-10-CM

## 2013-06-13 MED ORDER — SODIUM CHLORIDE 0.9 % IJ SOLN
10.0000 mL | INTRAMUSCULAR | Status: DC | PRN
  Administered 2013-06-13: 10 mL
  Filled 2013-06-13: qty 10

## 2013-06-13 MED ORDER — HEPARIN SOD (PORK) LOCK FLUSH 100 UNIT/ML IV SOLN
500.0000 [IU] | Freq: Once | INTRAVENOUS | Status: AC | PRN
  Administered 2013-06-13: 500 [IU]
  Filled 2013-06-13: qty 5

## 2013-06-14 ENCOUNTER — Other Ambulatory Visit: Payer: Self-pay | Admitting: Certified Registered Nurse Anesthetist

## 2013-06-14 ENCOUNTER — Telehealth: Payer: Self-pay | Admitting: Oncology

## 2013-06-14 NOTE — Telephone Encounter (Signed)
Gave pt appt for lab, MD and chemo for August 2014

## 2013-06-20 ENCOUNTER — Encounter: Payer: Self-pay | Admitting: Oncology

## 2013-06-24 ENCOUNTER — Other Ambulatory Visit: Payer: Self-pay | Admitting: Oncology

## 2013-06-25 ENCOUNTER — Other Ambulatory Visit (HOSPITAL_BASED_OUTPATIENT_CLINIC_OR_DEPARTMENT_OTHER): Payer: PRIVATE HEALTH INSURANCE | Admitting: Lab

## 2013-06-25 ENCOUNTER — Telehealth: Payer: Self-pay | Admitting: Oncology

## 2013-06-25 ENCOUNTER — Ambulatory Visit (HOSPITAL_BASED_OUTPATIENT_CLINIC_OR_DEPARTMENT_OTHER): Payer: PRIVATE HEALTH INSURANCE | Admitting: Nurse Practitioner

## 2013-06-25 ENCOUNTER — Ambulatory Visit (HOSPITAL_BASED_OUTPATIENT_CLINIC_OR_DEPARTMENT_OTHER): Payer: PRIVATE HEALTH INSURANCE

## 2013-06-25 VITALS — BP 105/73 | HR 85 | Temp 98.2°F | Resp 18 | Ht 65.0 in | Wt 122.4 lb

## 2013-06-25 DIAGNOSIS — C189 Malignant neoplasm of colon, unspecified: Secondary | ICD-10-CM

## 2013-06-25 DIAGNOSIS — C187 Malignant neoplasm of sigmoid colon: Secondary | ICD-10-CM

## 2013-06-25 DIAGNOSIS — Z5111 Encounter for antineoplastic chemotherapy: Secondary | ICD-10-CM

## 2013-06-25 LAB — CBC WITH DIFFERENTIAL/PLATELET
BASO%: 0.7 % (ref 0.0–2.0)
Basophils Absolute: 0 10*3/uL (ref 0.0–0.1)
EOS%: 1.2 % (ref 0.0–7.0)
HCT: 34.1 % — ABNORMAL LOW (ref 34.8–46.6)
LYMPH%: 29.5 % (ref 14.0–49.7)
MCH: 29.1 pg (ref 25.1–34.0)
MCHC: 33.4 g/dL (ref 31.5–36.0)
MONO#: 0.6 10*3/uL (ref 0.1–0.9)
NEUT%: 53.3 % (ref 38.4–76.8)
Platelets: 174 10*3/uL (ref 145–400)

## 2013-06-25 LAB — COMPREHENSIVE METABOLIC PANEL (CC13)
ALT: 17 U/L (ref 0–55)
BUN: 8.5 mg/dL (ref 7.0–26.0)
CO2: 28 mEq/L (ref 22–29)
Creatinine: 0.7 mg/dL (ref 0.6–1.1)
Total Bilirubin: 0.29 mg/dL (ref 0.20–1.20)

## 2013-06-25 MED ORDER — FLUOROURACIL CHEMO INJECTION 2.5 GM/50ML
400.0000 mg/m2 | Freq: Once | INTRAVENOUS | Status: AC
  Administered 2013-06-25: 650 mg via INTRAVENOUS
  Filled 2013-06-25: qty 13

## 2013-06-25 MED ORDER — DEXTROSE 5 % IV SOLN
Freq: Once | INTRAVENOUS | Status: AC
  Administered 2013-06-25: 11:00:00 via INTRAVENOUS

## 2013-06-25 MED ORDER — PALONOSETRON HCL INJECTION 0.25 MG/5ML
0.2500 mg | Freq: Once | INTRAVENOUS | Status: AC
  Administered 2013-06-25: 0.25 mg via INTRAVENOUS

## 2013-06-25 MED ORDER — DEXAMETHASONE SODIUM PHOSPHATE 10 MG/ML IJ SOLN
10.0000 mg | Freq: Once | INTRAMUSCULAR | Status: AC
  Administered 2013-06-25: 10 mg via INTRAVENOUS

## 2013-06-25 MED ORDER — SODIUM CHLORIDE 0.9 % IV SOLN
150.0000 mg | Freq: Once | INTRAVENOUS | Status: AC
  Administered 2013-06-25: 150 mg via INTRAVENOUS
  Filled 2013-06-25: qty 5

## 2013-06-25 MED ORDER — OXALIPLATIN CHEMO INJECTION 100 MG/20ML
85.0000 mg/m2 | Freq: Once | INTRAVENOUS | Status: AC
  Administered 2013-06-25: 135 mg via INTRAVENOUS
  Filled 2013-06-25: qty 27

## 2013-06-25 MED ORDER — LEUCOVORIN CALCIUM INJECTION 350 MG
400.0000 mg/m2 | Freq: Once | INTRAVENOUS | Status: AC
  Administered 2013-06-25: 632 mg via INTRAVENOUS
  Filled 2013-06-25: qty 31.6

## 2013-06-25 MED ORDER — FLUOROURACIL CHEMO INJECTION 5 GM/100ML
2400.0000 mg/m2 | INTRAVENOUS | Status: DC
  Administered 2013-06-25: 3800 mg via INTRAVENOUS
  Filled 2013-06-25: qty 76

## 2013-06-25 NOTE — Telephone Encounter (Signed)
gv and printed appt sched and avs for pt...emailed MB to add tx.   °

## 2013-06-25 NOTE — Progress Notes (Signed)
OFFICE PROGRESS NOTE  Interval history:  Ms. Priscilla Hill returns as scheduled. She completed cycle 2 FOLFOX 06/11/2013. She had nausea lasting approximately 7 days. She noted a dry mouth. No mouth ulcers. She had a "stomachache" followed by diarrhea. Cold sensitivity lasted 6 days. No persistent neuropathy symptoms.   Objective: Blood pressure 105/73, pulse 85, temperature 98.2 F (36.8 C), temperature source Oral, resp. rate 18, height 5\' 5"  (1.651 m), weight 122 lb 6.4 oz (55.52 kg).  Oropharynx is without thrush or ulceration. Lungs are clear. Regular cardiac rhythm. Port-A-Cath site is without erythema. Abdomen is soft and nontender. No hepatomegaly. Extremities are without edema. Vibratory sense intact over the fingertips per tuning fork exam.  Lab Results: Lab Results  Component Value Date   WBC 4.1 06/25/2013   HGB 11.4* 06/25/2013   HCT 34.1* 06/25/2013   MCV 87.2 06/25/2013   PLT 174 06/25/2013    Chemistry:    Chemistry      Component Value Date/Time   NA 142 06/25/2013 0953   NA 140 05/23/2013 1105   K 4.0 06/25/2013 0953   K 3.8 05/23/2013 1105   CL 103 05/23/2013 1105   CO2 28 06/25/2013 0953   CO2 29 05/23/2013 1105   BUN 8.5 06/25/2013 0953   BUN 8 05/23/2013 1105   CREATININE 0.7 06/25/2013 0953   CREATININE 0.60 05/23/2013 1105      Component Value Date/Time   CALCIUM 9.2 06/25/2013 0953   CALCIUM 9.6 05/23/2013 1105   ALKPHOS 63 06/25/2013 0953   ALKPHOS 59 05/23/2013 1105   AST 19 06/25/2013 0953   AST 16 05/23/2013 1105   ALT 17 06/25/2013 0953   ALT 19 05/23/2013 1105   BILITOT 0.29 06/25/2013 0953   BILITOT 0.5 05/23/2013 1105       Studies/Results: No results found.  Medications: I have reviewed the patient's current medications.  Assessment/Plan:  1. 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   Cycle 1 adjuvant FOLFOX chemotherapy 05/28/2013. 2. Microcytic anemia. Improved. 3. Port-A-Cath placement  05/24/2013. 4. History of uterine fibroids. 5. Nausea following cycle 1 FOLFOX. Aloxi and prophylactic Decadron were added with cycle 2. She  had nausea following cycle 2. Emend will be added with cycle 3.  Disposition-she has completed 2 cycles of FOLFOX chemotherapy. Plan to proceed with cycle 3 today as scheduled. She will receive Aloxi and Emend today and begin prophylactic Decadron on day 2. She has Ativan, Compazine and Zofran at home for as needed use. She will contact the office if she has nausea despite the above medications. She will return for a followup visit and cycle 4 in 2 weeks.  Plan reviewed with Dr. Truett Perna.  Lonna Cobb ANP/GNP-BC

## 2013-06-26 ENCOUNTER — Ambulatory Visit (INDEPENDENT_AMBULATORY_CARE_PROVIDER_SITE_OTHER): Payer: PRIVATE HEALTH INSURANCE | Admitting: General Surgery

## 2013-06-26 ENCOUNTER — Encounter (INDEPENDENT_AMBULATORY_CARE_PROVIDER_SITE_OTHER): Payer: Self-pay

## 2013-06-26 ENCOUNTER — Encounter (INDEPENDENT_AMBULATORY_CARE_PROVIDER_SITE_OTHER): Payer: Self-pay | Admitting: General Surgery

## 2013-06-26 VITALS — BP 100/68 | HR 93 | Temp 97.3°F | Ht 66.0 in | Wt 123.0 lb

## 2013-06-26 DIAGNOSIS — Z9889 Other specified postprocedural states: Secondary | ICD-10-CM

## 2013-06-26 NOTE — Progress Notes (Signed)
Procedure:  Laparoscopic-assisted sigmoid colectomy  Date:  04/24/2013  Pathology:  T2N1b  History:  She is here for another postoperative visit. She is tolerating the chemotherapy fairly well. She does get diarrhea after this. Exam: General- Is in NAD. Abdomen-Soft, scars are clean and intact and solid.  Assessment:  Stage III sigmoid colon cancer status post sigmoid colectomy. She is recovering well.  Plan:  Resume normal activities as tolerated. May return to full duty work September 1. I will see her back when she is done with her chemotherapy and needs to have her Port-A-Cath removed.

## 2013-06-26 NOTE — Patient Instructions (Signed)
May resume normal activities as tolerated. May return to full duty work September 1.

## 2013-06-27 ENCOUNTER — Ambulatory Visit (HOSPITAL_BASED_OUTPATIENT_CLINIC_OR_DEPARTMENT_OTHER): Payer: PRIVATE HEALTH INSURANCE

## 2013-06-27 VITALS — BP 111/73 | HR 56 | Temp 98.4°F

## 2013-06-27 DIAGNOSIS — C189 Malignant neoplasm of colon, unspecified: Secondary | ICD-10-CM

## 2013-06-27 DIAGNOSIS — Z452 Encounter for adjustment and management of vascular access device: Secondary | ICD-10-CM

## 2013-06-27 DIAGNOSIS — C187 Malignant neoplasm of sigmoid colon: Secondary | ICD-10-CM

## 2013-06-27 MED ORDER — HEPARIN SOD (PORK) LOCK FLUSH 100 UNIT/ML IV SOLN
500.0000 [IU] | Freq: Once | INTRAVENOUS | Status: AC | PRN
  Administered 2013-06-27: 500 [IU]
  Filled 2013-06-27: qty 5

## 2013-06-27 MED ORDER — SODIUM CHLORIDE 0.9 % IJ SOLN
10.0000 mL | INTRAMUSCULAR | Status: DC | PRN
  Administered 2013-06-27: 10 mL
  Filled 2013-06-27: qty 10

## 2013-07-07 ENCOUNTER — Other Ambulatory Visit: Payer: Self-pay | Admitting: Oncology

## 2013-07-09 ENCOUNTER — Telehealth: Payer: Self-pay

## 2013-07-09 ENCOUNTER — Telehealth: Payer: Self-pay | Admitting: *Deleted

## 2013-07-09 ENCOUNTER — Ambulatory Visit (HOSPITAL_BASED_OUTPATIENT_CLINIC_OR_DEPARTMENT_OTHER): Payer: PRIVATE HEALTH INSURANCE

## 2013-07-09 ENCOUNTER — Ambulatory Visit (HOSPITAL_BASED_OUTPATIENT_CLINIC_OR_DEPARTMENT_OTHER): Payer: PRIVATE HEALTH INSURANCE | Admitting: Oncology

## 2013-07-09 ENCOUNTER — Other Ambulatory Visit (HOSPITAL_BASED_OUTPATIENT_CLINIC_OR_DEPARTMENT_OTHER): Payer: PRIVATE HEALTH INSURANCE | Admitting: Lab

## 2013-07-09 VITALS — BP 109/74 | HR 96 | Temp 98.8°F | Resp 18 | Ht 66.0 in | Wt 123.7 lb

## 2013-07-09 DIAGNOSIS — C187 Malignant neoplasm of sigmoid colon: Secondary | ICD-10-CM

## 2013-07-09 DIAGNOSIS — C189 Malignant neoplasm of colon, unspecified: Secondary | ICD-10-CM

## 2013-07-09 DIAGNOSIS — R110 Nausea: Secondary | ICD-10-CM

## 2013-07-09 DIAGNOSIS — D509 Iron deficiency anemia, unspecified: Secondary | ICD-10-CM

## 2013-07-09 DIAGNOSIS — D702 Other drug-induced agranulocytosis: Secondary | ICD-10-CM

## 2013-07-09 DIAGNOSIS — Z5111 Encounter for antineoplastic chemotherapy: Secondary | ICD-10-CM

## 2013-07-09 LAB — COMPREHENSIVE METABOLIC PANEL (CC13)
ALT: 58 U/L — ABNORMAL HIGH (ref 0–55)
AST: 34 U/L (ref 5–34)
Albumin: 3.3 g/dL — ABNORMAL LOW (ref 3.5–5.0)
Alkaline Phosphatase: 64 U/L (ref 40–150)
BUN: 7.3 mg/dL (ref 7.0–26.0)
CO2: 26 mEq/L (ref 22–29)
Calcium: 9.2 mg/dL (ref 8.4–10.4)
Chloride: 107 mEq/L (ref 98–109)
Creatinine: 0.6 mg/dL (ref 0.6–1.1)
Glucose: 93 mg/dl (ref 70–140)
Potassium: 3.5 mEq/L (ref 3.5–5.1)
Sodium: 142 mEq/L (ref 136–145)
Total Bilirubin: 0.47 mg/dL (ref 0.20–1.20)
Total Protein: 6.8 g/dL (ref 6.4–8.3)

## 2013-07-09 LAB — CBC WITH DIFFERENTIAL/PLATELET
BASO%: 1.1 % (ref 0.0–2.0)
HCT: 35.9 % (ref 34.8–46.6)
MCHC: 32.6 g/dL (ref 31.5–36.0)
MONO#: 0.4 10*3/uL (ref 0.1–0.9)
RBC: 4.08 10*6/uL (ref 3.70–5.45)
WBC: 2.8 10*3/uL — ABNORMAL LOW (ref 3.9–10.3)
lymph#: 1.2 10*3/uL (ref 0.9–3.3)
nRBC: 0 % (ref 0–0)

## 2013-07-09 MED ORDER — PALONOSETRON HCL INJECTION 0.25 MG/5ML
0.2500 mg | Freq: Once | INTRAVENOUS | Status: AC
  Administered 2013-07-09: 0.25 mg via INTRAVENOUS

## 2013-07-09 MED ORDER — SODIUM CHLORIDE 0.9 % IV SOLN
150.0000 mg | Freq: Once | INTRAVENOUS | Status: AC
  Administered 2013-07-09: 150 mg via INTRAVENOUS
  Filled 2013-07-09: qty 5

## 2013-07-09 MED ORDER — DEXAMETHASONE SODIUM PHOSPHATE 10 MG/ML IJ SOLN
10.0000 mg | Freq: Once | INTRAMUSCULAR | Status: AC
  Administered 2013-07-09: 10 mg via INTRAVENOUS

## 2013-07-09 MED ORDER — SODIUM CHLORIDE 0.9 % IV SOLN
2400.0000 mg/m2 | INTRAVENOUS | Status: DC
  Administered 2013-07-09: 3800 mg via INTRAVENOUS
  Filled 2013-07-09: qty 76

## 2013-07-09 MED ORDER — LEUCOVORIN CALCIUM INJECTION 350 MG
400.0000 mg/m2 | Freq: Once | INTRAVENOUS | Status: AC
  Administered 2013-07-09: 632 mg via INTRAVENOUS
  Filled 2013-07-09: qty 31.6

## 2013-07-09 MED ORDER — DEXTROSE 5 % IV SOLN
Freq: Once | INTRAVENOUS | Status: AC
  Administered 2013-07-09: 13:00:00 via INTRAVENOUS

## 2013-07-09 MED ORDER — OXALIPLATIN CHEMO INJECTION 100 MG/20ML
85.0000 mg/m2 | Freq: Once | INTRAVENOUS | Status: AC
  Administered 2013-07-09: 135 mg via INTRAVENOUS
  Filled 2013-07-09: qty 27

## 2013-07-09 MED ORDER — FLUOROURACIL CHEMO INJECTION 2.5 GM/50ML
400.0000 mg/m2 | Freq: Once | INTRAVENOUS | Status: AC
  Administered 2013-07-09: 650 mg via INTRAVENOUS
  Filled 2013-07-09: qty 13

## 2013-07-09 NOTE — Telephone Encounter (Signed)
Per staff message and POF I have scheduled appts.  JMW  

## 2013-07-09 NOTE — Telephone Encounter (Signed)
gv and printed appt sched and avs for pt...emailed MW to add tx... °

## 2013-07-09 NOTE — Patient Instructions (Addendum)
[facility] Discharge Instructions for Patients Receiving Chemotherapy  Today you received the following chemotherapy agents: Oxaliplatin, Leucovorin, 5-FU  To help prevent nausea and vomiting after your treatment, we encourage you to take your nausea medication as directed by your physician.   If you develop nausea and vomiting that is not controlled by your nausea medication, call the clinic.   BELOW ARE SYMPTOMS THAT SHOULD BE REPORTED IMMEDIATELY:  *FEVER GREATER THAN 100.5 F  *CHILLS WITH OR WITHOUT FEVER  NAUSEA AND VOMITING THAT IS NOT CONTROLLED WITH YOUR NAUSEA MEDICATION  *UNUSUAL SHORTNESS OF BREATH  *UNUSUAL BRUISING OR BLEEDING  TENDERNESS IN MOUTH AND THROAT WITH OR WITHOUT PRESENCE OF ULCERS  *URINARY PROBLEMS  *BOWEL PROBLEMS  UNUSUAL RASH Items with * indicate a potential emergency and should be followed up as soon as possible.  Feel free to call the clinic you have any questions or concerns. The clinic phone number is [phone].

## 2013-07-09 NOTE — Progress Notes (Signed)
1235 Dr. Truett Perna notified of ANC 1.1 today. OK to treat as planned per MD. Patient to receive Neulasta with pump D/C. Patient informed of this plan and encouraged to take extra precautions and practice good hand hygiene due to low counts. Patient verbalized understanding. Shawna Kiener, RN  [phone] 5FU pump connected, infusing without difficulty. Patient informed to return 07/11/13 at 2 pm for pump disconnect; patient verbalizes understanding.

## 2013-07-09 NOTE — Progress Notes (Signed)
   [facility]    OFFICE PROGRESS NOTE   INTERVAL HISTORY:   She returns as scheduled. She completed another cycle of FOLFOX on 06/25/2013. She reports nausea for 7 days following chemotherapy, no emesis. There was less nausea following this cycle. She had mouth sores that were relieved with a mouthwash. No diarrhea. She reports constipation. Mild numbness in the fingertips.  Objective:  Vital signs in last 24 hours:  Blood pressure 109/74, pulse 96, temperature 98.8 F (37.1 C), temperature source Oral, resp. rate 18, height 5\' 6"  (1.676 m), weight 123 lb 11.2 oz (56.11 kg).    HEENT: No thrush or ulcers Resp: Lungs clear bilaterally Cardio: Regular rate and rhythm GI: No hepatomegaly, nontender Vascular: No leg edema Neuro: The vibratory sense is intact at the fingertips bilaterally    Portacath/PICC-without erythema  Lab Results:  Lab Results  Component Value Date   WBC 2.8* 07/09/2013   HGB 11.7 07/09/2013   HCT 35.9 07/09/2013   MCV 88.0 07/09/2013   PLT 133* 07/09/2013   ANC 1.1    Medications: I have reviewed the patient's current medications.  Assessment/Plan: 1. 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  Cycle 1 adjuvant FOLFOX chemotherapy 05/28/2013. 2. Microcytic anemia. Improved. 3. Port-A-Cath placement 05/24/2013. 4. History of uterine fibroids. 5. Nausea following cycle 1 FOLFOX. Aloxi and prophylactic Decadron were added with cycle 2. She had nausea following cycle 2. Emend was added with cycle 3. 6. Mild neutropenia secondary chemotherapy  Disposition:  She has completed 3 cycles of FOLFOX. She continues to have nausea following chemotherapy, but this was improved following cycle 3. She has mild neutropenia today. We discussed delaying chemotherapy versus adding Neulasta. She would like to keep the chemotherapy on schedule. She understands the risk of  neutropenia/infection and will contact us for a fever or other symptoms of infection. We discussed the potential toxicities associated with Neulasta including the chance for a skin rash, spleen rupture, and bone pain.  Ms. Buccellato will return for an office visit and cycle 5 on 07/24/2013.   Thornton Papas, MD  07/09/2013  2:02 PM

## 2013-07-11 ENCOUNTER — Ambulatory Visit (HOSPITAL_BASED_OUTPATIENT_CLINIC_OR_DEPARTMENT_OTHER): Payer: PRIVATE HEALTH INSURANCE

## 2013-07-11 VITALS — BP 85/63 | HR 83 | Temp 99.0°F

## 2013-07-11 DIAGNOSIS — D702 Other drug-induced agranulocytosis: Secondary | ICD-10-CM

## 2013-07-11 DIAGNOSIS — C187 Malignant neoplasm of sigmoid colon: Secondary | ICD-10-CM

## 2013-07-11 DIAGNOSIS — C189 Malignant neoplasm of colon, unspecified: Secondary | ICD-10-CM

## 2013-07-11 MED ORDER — SODIUM CHLORIDE 0.9 % IJ SOLN
10.0000 mL | INTRAMUSCULAR | Status: DC | PRN
  Administered 2013-07-11: 10 mL
  Filled 2013-07-11: qty 10

## 2013-07-11 MED ORDER — HEPARIN SOD (PORK) LOCK FLUSH 100 UNIT/ML IV SOLN
500.0000 [IU] | Freq: Once | INTRAVENOUS | Status: AC | PRN
  Administered 2013-07-11: 500 [IU]
  Filled 2013-07-11: qty 5

## 2013-07-11 MED ORDER — PEGFILGRASTIM INJECTION 6 MG/0.6ML
6.0000 mg | Freq: Once | SUBCUTANEOUS | Status: AC
  Administered 2013-07-11: 6 mg via SUBCUTANEOUS
  Filled 2013-07-11: qty 0.6

## 2013-07-23 ENCOUNTER — Other Ambulatory Visit: Payer: Self-pay | Admitting: Oncology

## 2013-07-24 ENCOUNTER — Other Ambulatory Visit (HOSPITAL_BASED_OUTPATIENT_CLINIC_OR_DEPARTMENT_OTHER): Payer: PRIVATE HEALTH INSURANCE

## 2013-07-24 ENCOUNTER — Ambulatory Visit (HOSPITAL_BASED_OUTPATIENT_CLINIC_OR_DEPARTMENT_OTHER): Payer: PRIVATE HEALTH INSURANCE | Admitting: Nurse Practitioner

## 2013-07-24 ENCOUNTER — Telehealth: Payer: Self-pay | Admitting: *Deleted

## 2013-07-24 ENCOUNTER — Telehealth: Payer: Self-pay

## 2013-07-24 ENCOUNTER — Ambulatory Visit (HOSPITAL_BASED_OUTPATIENT_CLINIC_OR_DEPARTMENT_OTHER): Payer: PRIVATE HEALTH INSURANCE

## 2013-07-24 VITALS — BP 116/82 | HR 87 | Temp 97.9°F | Resp 19 | Ht 66.0 in | Wt 123.7 lb

## 2013-07-24 DIAGNOSIS — C187 Malignant neoplasm of sigmoid colon: Secondary | ICD-10-CM

## 2013-07-24 DIAGNOSIS — R109 Unspecified abdominal pain: Secondary | ICD-10-CM

## 2013-07-24 DIAGNOSIS — Z5111 Encounter for antineoplastic chemotherapy: Secondary | ICD-10-CM

## 2013-07-24 DIAGNOSIS — D702 Other drug-induced agranulocytosis: Secondary | ICD-10-CM

## 2013-07-24 DIAGNOSIS — C189 Malignant neoplasm of colon, unspecified: Secondary | ICD-10-CM

## 2013-07-24 DIAGNOSIS — D509 Iron deficiency anemia, unspecified: Secondary | ICD-10-CM

## 2013-07-24 DIAGNOSIS — D696 Thrombocytopenia, unspecified: Secondary | ICD-10-CM

## 2013-07-24 DIAGNOSIS — R110 Nausea: Secondary | ICD-10-CM

## 2013-07-24 LAB — CBC WITH DIFFERENTIAL/PLATELET
BASO%: 0.6 % (ref 0.0–2.0)
EOS%: 0.1 % (ref 0.0–7.0)
MCH: 29.2 pg (ref 25.1–34.0)
MCHC: 32.6 g/dL (ref 31.5–36.0)
MONO%: 8.8 % (ref 0.0–14.0)
NEUT%: 76.9 % — ABNORMAL HIGH (ref 38.4–76.8)
RDW: 15.8 % — ABNORMAL HIGH (ref 11.2–14.5)
lymph#: 1.4 10*3/uL (ref 0.9–3.3)

## 2013-07-24 LAB — COMPREHENSIVE METABOLIC PANEL (CC13)
AST: 38 U/L — ABNORMAL HIGH (ref 5–34)
Albumin: 3.3 g/dL — ABNORMAL LOW (ref 3.5–5.0)
Alkaline Phosphatase: 109 U/L (ref 40–150)
BUN: 5.8 mg/dL — ABNORMAL LOW (ref 7.0–26.0)
Potassium: 3.7 mEq/L (ref 3.5–5.1)
Sodium: 144 mEq/L (ref 136–145)
Total Bilirubin: 0.44 mg/dL (ref 0.20–1.20)

## 2013-07-24 MED ORDER — SODIUM CHLORIDE 0.9 % IV SOLN
2400.0000 mg/m2 | INTRAVENOUS | Status: DC
  Administered 2013-07-24: 3800 mg via INTRAVENOUS
  Filled 2013-07-24: qty 76

## 2013-07-24 MED ORDER — FLUOROURACIL CHEMO INJECTION 2.5 GM/50ML
400.0000 mg/m2 | Freq: Once | INTRAVENOUS | Status: AC
  Administered 2013-07-24: 650 mg via INTRAVENOUS
  Filled 2013-07-24: qty 13

## 2013-07-24 MED ORDER — PALONOSETRON HCL INJECTION 0.25 MG/5ML
0.2500 mg | Freq: Once | INTRAVENOUS | Status: AC
  Administered 2013-07-24: 0.25 mg via INTRAVENOUS

## 2013-07-24 MED ORDER — OXALIPLATIN CHEMO INJECTION 100 MG/20ML
85.0000 mg/m2 | Freq: Once | INTRAVENOUS | Status: AC
  Administered 2013-07-24: 135 mg via INTRAVENOUS
  Filled 2013-07-24: qty 27

## 2013-07-24 MED ORDER — SODIUM CHLORIDE 0.9 % IV SOLN
150.0000 mg | Freq: Once | INTRAVENOUS | Status: AC
  Administered 2013-07-24: 150 mg via INTRAVENOUS
  Filled 2013-07-24: qty 5

## 2013-07-24 MED ORDER — LEUCOVORIN CALCIUM INJECTION 350 MG
400.0000 mg/m2 | Freq: Once | INTRAMUSCULAR | Status: AC
  Administered 2013-07-24: 632 mg via INTRAVENOUS
  Filled 2013-07-24: qty 31.6

## 2013-07-24 MED ORDER — HEPARIN SOD (PORK) LOCK FLUSH 100 UNIT/ML IV SOLN
500.0000 [IU] | Freq: Once | INTRAVENOUS | Status: DC | PRN
  Filled 2013-07-24: qty 5

## 2013-07-24 MED ORDER — DEXTROSE 5 % IV SOLN
Freq: Once | INTRAVENOUS | Status: AC
  Administered 2013-07-24: 12:00:00 via INTRAVENOUS

## 2013-07-24 MED ORDER — SODIUM CHLORIDE 0.9 % IV SOLN
Freq: Once | INTRAVENOUS | Status: AC
  Administered 2013-07-24: 11:00:00 via INTRAVENOUS

## 2013-07-24 MED ORDER — DEXAMETHASONE SODIUM PHOSPHATE 10 MG/ML IJ SOLN
10.0000 mg | Freq: Once | INTRAMUSCULAR | Status: AC
  Administered 2013-07-24: 10 mg via INTRAVENOUS

## 2013-07-24 MED ORDER — SODIUM CHLORIDE 0.9 % IJ SOLN
10.0000 mL | INTRAMUSCULAR | Status: DC | PRN
  Filled 2013-07-24: qty 10

## 2013-07-24 NOTE — Patient Instructions (Signed)
[facility] Discharge Instructions for Patients Receiving Chemotherapy  Today you received the following chemotherapy agents:  Oxaliplatin, Leucovorin, 5FU  RETURN FOR PUMP DISCONNECT ON Thursday, Sept 4 at   To help prevent nausea and vomiting after your treatment, we encourage you to take your nausea medication.  Take it as often as prescribed.     If you develop nausea and vomiting that is not controlled by your nausea medication, call the clinic. If it is after clinic hours your family physician or the after hours number for the clinic or go to the Emergency Department.   BELOW ARE SYMPTOMS THAT SHOULD BE REPORTED IMMEDIATELY:  *FEVER GREATER THAN 100.5 F  *CHILLS WITH OR WITHOUT FEVER  NAUSEA AND VOMITING THAT IS NOT CONTROLLED WITH YOUR NAUSEA MEDICATION  *UNUSUAL SHORTNESS OF BREATH  *UNUSUAL BRUISING OR BLEEDING  TENDERNESS IN MOUTH AND THROAT WITH OR WITHOUT PRESENCE OF ULCERS  *URINARY PROBLEMS  *BOWEL PROBLEMS  UNUSUAL RASH Items with * indicate a potential emergency and should be followed up as soon as possible.  Feel free to call the clinic you have any questions or concerns. The clinic phone number is [phone].   I have been informed and understand all the instructions given to me. I know to contact the clinic, my physician, or go to the Emergency Department if any problems should occur. I do not have any questions at this time, but understand that I may call the clinic during office hours   should I have any questions or need assistance in obtaining follow up care.    __________________________________________  _____________  __________ Signature of Patient or Authorized Representative            Date                   Time    __________________________________________ Nurse's Signature

## 2013-07-24 NOTE — Telephone Encounter (Signed)
Per staff message and POF I have scheduled appts.  JMW  

## 2013-07-24 NOTE — Telephone Encounter (Signed)
gv and printed appt sched and avs for pt for Sept....MW added tx..   °

## 2013-07-24 NOTE — Progress Notes (Signed)
OFFICE PROGRESS NOTE  Interval history:  Priscilla Hill returns as scheduled. She completed cycle 4 FOLFOX on 07/09/2013. The nausea was better as compared to previous cycles. No mouth sores. She does note a dry mouth. No diarrhea. She is intermittently constipated. Around day 4 she developed a "stomach ache". The discomfort lasted for 3-4 days and was similar to when she has had gastritis in the past. Cold sensitivity lasted about 7 days. No persistent neuropathy symptoms.   Objective: Blood pressure 116/82, pulse 87, temperature 97.9 F (36.6 C), temperature source Oral, resp. rate 19, height 5\' 6"  (1.676 m), weight 123 lb 11.2 oz (56.11 kg).  Oropharynx is without thrush or ulceration. Lungs are clear. Regular cardiac rhythm. Port-A-Cath site is without erythema. Abdomen is soft and nontender. No hepatomegaly. Extremities are without edema. Calves soft and nontender. Vibratory sense intact over the fingertips per tuning fork exam.  Lab Results: Lab Results  Component Value Date   WBC 10.6* 07/24/2013   HGB 11.1* 07/24/2013   HCT 34.1* 07/24/2013   MCV 89.7 07/24/2013   PLT 108* 07/24/2013    Chemistry:    Chemistry      Component Value Date/Time   NA 142 07/09/2013 1032   NA 140 05/23/2013 1105   K 3.5 07/09/2013 1032   K 3.8 05/23/2013 1105   CL 103 05/23/2013 1105   CO2 26 07/09/2013 1032   CO2 29 05/23/2013 1105   BUN 7.3 07/09/2013 1032   BUN 8 05/23/2013 1105   CREATININE 0.6 07/09/2013 1032   CREATININE 0.60 05/23/2013 1105      Component Value Date/Time   CALCIUM 9.2 07/09/2013 1032   CALCIUM 9.6 05/23/2013 1105   ALKPHOS 64 07/09/2013 1032   ALKPHOS 59 05/23/2013 1105   AST 34 07/09/2013 1032   AST 16 05/23/2013 1105   ALT 58* 07/09/2013 1032   ALT 19 05/23/2013 1105   BILITOT 0.47 07/09/2013 1032   BILITOT 0.5 05/23/2013 1105       Studies/Results: No results found.  Medications: I have reviewed the patient's current medications.  Assessment/Plan:  1. Stage III (T2 N1) moderately  differentiated adenocarcinoma of the sigmoid colon status post sigmoid colectomy 04/24/2013.  Cycle 1 adjuvant FOLFOX chemotherapy 05/28/2013. 2. Microcytic anemia. Improved. She continues oral iron. 3. Port-A-Cath placement 05/24/2013. 4. History of uterine fibroids. 5. Nausea following cycle 1 FOLFOX. Aloxi and prophylactic Decadron were added with cycle 2. She had nausea following cycle 2. Emend was added with cycle 3. 6. Mild neutropenia secondary chemotherapy. She received Neulasta with cycle 4 FOLFOX. 7. Abdominal discomfort following cycle 4 FOLFOX. Question gastritis related to steroids. She will begin Prilosec 20 mg daily. 8. Mild thrombocytopenia.  Disposition-Ms. Weier appears stable. She has completed 4 cycles of FOLFOX. She has mild thrombocytopenia on labs today. Plan to proceed with cycle 5 FOLFOX today as scheduled. She will return for a followup visit and cycle 6 in 2 weeks. She will contact the office in the interim with any problems. We specifically discussed bleeding.  Plan reviewed with Dr. Truett Perna.  Lonna Cobb ANP/GNP-BC

## 2013-07-26 ENCOUNTER — Ambulatory Visit: Payer: PRIVATE HEALTH INSURANCE

## 2013-07-26 ENCOUNTER — Ambulatory Visit (HOSPITAL_BASED_OUTPATIENT_CLINIC_OR_DEPARTMENT_OTHER): Payer: PRIVATE HEALTH INSURANCE

## 2013-07-26 VITALS — BP 112/71 | HR 82 | Temp 98.7°F | Resp 18

## 2013-07-26 DIAGNOSIS — Z5189 Encounter for other specified aftercare: Secondary | ICD-10-CM

## 2013-07-26 DIAGNOSIS — C189 Malignant neoplasm of colon, unspecified: Secondary | ICD-10-CM

## 2013-07-26 DIAGNOSIS — C187 Malignant neoplasm of sigmoid colon: Secondary | ICD-10-CM

## 2013-07-26 DIAGNOSIS — Z452 Encounter for adjustment and management of vascular access device: Secondary | ICD-10-CM

## 2013-07-26 MED ORDER — HEPARIN SOD (PORK) LOCK FLUSH 100 UNIT/ML IV SOLN
500.0000 [IU] | Freq: Once | INTRAVENOUS | Status: AC | PRN
  Administered 2013-07-26: 500 [IU]
  Filled 2013-07-26: qty 5

## 2013-07-26 MED ORDER — PEGFILGRASTIM INJECTION 6 MG/0.6ML
6.0000 mg | Freq: Once | SUBCUTANEOUS | Status: AC
  Administered 2013-07-26: 6 mg via SUBCUTANEOUS
  Filled 2013-07-26: qty 0.6

## 2013-07-26 MED ORDER — SODIUM CHLORIDE 0.9 % IJ SOLN
10.0000 mL | INTRAMUSCULAR | Status: DC | PRN
  Administered 2013-07-26: 10 mL
  Filled 2013-07-26: qty 10

## 2013-07-27 NOTE — Progress Notes (Signed)
FAXED 39 PAGES TO Lucienne Capers, RN @ [employer] [phone]. SHE IS HIS CASE MANAGER HER PHONE NUMBER IS [phone].

## 2013-08-04 ENCOUNTER — Other Ambulatory Visit: Payer: Self-pay | Admitting: Oncology

## 2013-08-06 ENCOUNTER — Telehealth: Payer: Self-pay | Admitting: Oncology

## 2013-08-06 ENCOUNTER — Ambulatory Visit (HOSPITAL_BASED_OUTPATIENT_CLINIC_OR_DEPARTMENT_OTHER): Payer: PRIVATE HEALTH INSURANCE | Admitting: Nurse Practitioner

## 2013-08-06 ENCOUNTER — Other Ambulatory Visit (HOSPITAL_BASED_OUTPATIENT_CLINIC_OR_DEPARTMENT_OTHER): Payer: PRIVATE HEALTH INSURANCE | Admitting: Lab

## 2013-08-06 ENCOUNTER — Ambulatory Visit (HOSPITAL_BASED_OUTPATIENT_CLINIC_OR_DEPARTMENT_OTHER): Payer: PRIVATE HEALTH INSURANCE

## 2013-08-06 ENCOUNTER — Telehealth: Payer: Self-pay | Admitting: *Deleted

## 2013-08-06 VITALS — BP 105/75 | HR 85 | Temp 100.3°F | Resp 18 | Ht 66.0 in | Wt 125.4 lb

## 2013-08-06 DIAGNOSIS — D702 Other drug-induced agranulocytosis: Secondary | ICD-10-CM

## 2013-08-06 DIAGNOSIS — C187 Malignant neoplasm of sigmoid colon: Secondary | ICD-10-CM

## 2013-08-06 DIAGNOSIS — L519 Erythema multiforme, unspecified: Secondary | ICD-10-CM

## 2013-08-06 DIAGNOSIS — L299 Pruritus, unspecified: Secondary | ICD-10-CM

## 2013-08-06 DIAGNOSIS — D509 Iron deficiency anemia, unspecified: Secondary | ICD-10-CM

## 2013-08-06 DIAGNOSIS — C189 Malignant neoplasm of colon, unspecified: Secondary | ICD-10-CM

## 2013-08-06 DIAGNOSIS — Z5111 Encounter for antineoplastic chemotherapy: Secondary | ICD-10-CM

## 2013-08-06 LAB — CBC WITH DIFFERENTIAL/PLATELET
Basophils Absolute: 0.1 10*3/uL (ref 0.0–0.1)
Eosinophils Absolute: 0 10*3/uL (ref 0.0–0.5)
HGB: 10.8 g/dL — ABNORMAL LOW (ref 11.6–15.9)
MCV: 91.3 fL (ref 79.5–101.0)
MONO#: 1.8 10*3/uL — ABNORMAL HIGH (ref 0.1–0.9)
NEUT#: 10.4 10*3/uL — ABNORMAL HIGH (ref 1.5–6.5)
RBC: 3.67 10*6/uL — ABNORMAL LOW (ref 3.70–5.45)
RDW: 17.7 % — ABNORMAL HIGH (ref 11.2–14.5)
WBC: 14.8 10*3/uL — ABNORMAL HIGH (ref 3.9–10.3)

## 2013-08-06 LAB — COMPREHENSIVE METABOLIC PANEL (CC13)
Albumin: 3.4 g/dL — ABNORMAL LOW (ref 3.5–5.0)
BUN: 8.4 mg/dL (ref 7.0–26.0)
CO2: 26 mEq/L (ref 22–29)
Calcium: 9.4 mg/dL (ref 8.4–10.4)
Chloride: 108 mEq/L (ref 98–109)
Glucose: 98 mg/dl (ref 70–140)
Potassium: 3.5 mEq/L (ref 3.5–5.1)
Sodium: 141 mEq/L (ref 136–145)
Total Protein: 6.7 g/dL (ref 6.4–8.3)

## 2013-08-06 MED ORDER — DEXTROSE 5 % IV SOLN
Freq: Once | INTRAVENOUS | Status: AC
  Administered 2013-08-06: 11:00:00 via INTRAVENOUS

## 2013-08-06 MED ORDER — DEXAMETHASONE SODIUM PHOSPHATE 10 MG/ML IJ SOLN
INTRAMUSCULAR | Status: AC
  Filled 2013-08-06: qty 1

## 2013-08-06 MED ORDER — SODIUM CHLORIDE 0.9 % IV SOLN
2400.0000 mg/m2 | INTRAVENOUS | Status: DC
  Administered 2013-08-06: 3800 mg via INTRAVENOUS
  Filled 2013-08-06: qty 76

## 2013-08-06 MED ORDER — SODIUM CHLORIDE 0.9 % IV SOLN
150.0000 mg | Freq: Once | INTRAVENOUS | Status: AC
  Administered 2013-08-06: 150 mg via INTRAVENOUS
  Filled 2013-08-06: qty 5

## 2013-08-06 MED ORDER — PALONOSETRON HCL INJECTION 0.25 MG/5ML
INTRAVENOUS | Status: AC
  Filled 2013-08-06: qty 5

## 2013-08-06 MED ORDER — OXALIPLATIN CHEMO INJECTION 100 MG/20ML
85.0000 mg/m2 | Freq: Once | INTRAVENOUS | Status: AC
  Administered 2013-08-06: 135 mg via INTRAVENOUS
  Filled 2013-08-06: qty 27

## 2013-08-06 MED ORDER — LEUCOVORIN CALCIUM INJECTION 350 MG
400.0000 mg/m2 | Freq: Once | INTRAVENOUS | Status: AC
  Administered 2013-08-06: 632 mg via INTRAVENOUS
  Filled 2013-08-06: qty 31.6

## 2013-08-06 MED ORDER — FLUOROURACIL CHEMO INJECTION 2.5 GM/50ML
400.0000 mg/m2 | Freq: Once | INTRAVENOUS | Status: AC
  Administered 2013-08-06: 650 mg via INTRAVENOUS
  Filled 2013-08-06: qty 13

## 2013-08-06 MED ORDER — DEXAMETHASONE SODIUM PHOSPHATE 10 MG/ML IJ SOLN
10.0000 mg | Freq: Once | INTRAMUSCULAR | Status: AC
  Administered 2013-08-06: 10 mg via INTRAVENOUS

## 2013-08-06 MED ORDER — PALONOSETRON HCL INJECTION 0.25 MG/5ML
0.2500 mg | Freq: Once | INTRAVENOUS | Status: AC
  Administered 2013-08-06: 0.25 mg via INTRAVENOUS

## 2013-08-06 NOTE — Telephone Encounter (Signed)
Per staff message and POF I have scheduled appts.  JMW  

## 2013-08-06 NOTE — Progress Notes (Signed)
OFFICE PROGRESS NOTE  Interval history:  Priscilla Hill is a 47 year old woman with stage III colon cancer on active treatment with FOLFOX. She completed cycle 5 on 07/24/2013. She had no nausea or vomiting. No mouth sores. She did note that her mouth was dry. She had a few loose stools and then some constipation. Cold sensitivity lasted approximately 7 days. No persistent neuropathy symptoms. Around day 4 she noted itching and redness of the palms. This lasted 3 days.   Objective: Blood pressure 105/75, pulse 85, temperature 100.3 F (37.9 C), temperature source Oral, resp. rate 18, height 5\' 6"  (1.676 m), weight 125 lb 6.4 oz (56.881 kg).  Oropharynx is without thrush or ulceration. Mucous membranes appear moist. Lungs are clear. Regular cardiac rhythm. No murmur. Port-A-Cath site is without erythema. Abdomen is soft and nontender. No hepatomegaly. Extremities without edema. Calves soft and nontender. Vibratory sense intact over the fingertips per tuning fork exam. Palms are without erythema and skin breakdown.  Lab Results: Lab Results  Component Value Date   WBC 14.8* 08/06/2013   HGB 10.8* 08/06/2013   HCT 33.5* 08/06/2013   MCV 91.3 08/06/2013   PLT 120* 08/06/2013    Chemistry:    Chemistry      Component Value Date/Time   NA 144 07/24/2013 0945   NA 140 05/23/2013 1105   K 3.7 07/24/2013 0945   K 3.8 05/23/2013 1105   CL 103 05/23/2013 1105   CO2 26 07/24/2013 0945   CO2 29 05/23/2013 1105   BUN 5.8* 07/24/2013 0945   BUN 8 05/23/2013 1105   CREATININE 0.6 07/24/2013 0945   CREATININE 0.60 05/23/2013 1105      Component Value Date/Time   CALCIUM 9.2 07/24/2013 0945   CALCIUM 9.6 05/23/2013 1105   ALKPHOS 109 07/24/2013 0945   ALKPHOS 59 05/23/2013 1105   AST 38* 07/24/2013 0945   AST 16 05/23/2013 1105   ALT 46 07/24/2013 0945   ALT 19 05/23/2013 1105   BILITOT 0.44 07/24/2013 0945   BILITOT 0.5 05/23/2013 1105       Studies/Results: No results found.  Medications: I have reviewed the patient's current  medications.  Assessment/Plan:  1. 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  Cycle 1 adjuvant FOLFOX chemotherapy 05/28/2013. 2. Microcytic anemia. Improved. She continues oral iron. 3. Port-A-Cath placement 05/24/2013. 4. History of uterine fibroids. 5. Nausea following cycle 1 FOLFOX. Aloxi and prophylactic Decadron were added with cycle 2. She had nausea following cycle 2. Emend was added with cycle 3. Improved. No nausea following cycle 5. 6. Mild neutropenia secondary chemotherapy. She received Neulasta with cycle 4 FOLFOX and cycle 5 FOLFOX. 7. Abdominal discomfort following cycle 4 FOLFOX. Question gastritis related to steroids. She began Prilosec 20 mg daily. 8. Mild thrombocytopenia 07/24/2013. Improved. 9. Pruritus and erythema over the palms beginning day 4 following cycle 5 FOLFOX and lasting approximately 3 days. Question hand-foot syndrome related to 5-fluorouracil, question allergic reaction. She was instructed to contact the office if she experiences this again.  Disposition-she appears stable. She has completed 5 cycles of adjuvant FOLFOX. Plan to proceed with cycle 6 today as scheduled. She will return for a followup visit and cycle 7 in 2 weeks. She will contact the office in the interim as outlined above or with any other problems. Of note, Neulasta will be held with cycle 6 due to a total white count of 14.8 and absolute neutrophil of 10.4 on  labs today.  Plan reviewed with Dr. Truett Perna.  Lonna Cobb ANP/GNP-BC

## 2013-08-06 NOTE — Telephone Encounter (Signed)
Gave pt appt for lab, MD  and chemo for September and October 2014 °

## 2013-08-06 NOTE — Patient Instructions (Addendum)
[facility] Discharge Instructions for Patients Receiving Chemotherapy  Today you received the following chemotherapy agents FOLFOX.   To help prevent nausea and vomiting after your treatment, we encourage you to take your nausea medication as directed.    If you develop nausea and vomiting that is not controlled by your nausea medication, call the clinic.   BELOW ARE SYMPTOMS THAT SHOULD BE REPORTED IMMEDIATELY:  *FEVER GREATER THAN 100.5 F  *CHILLS WITH OR WITHOUT FEVER  NAUSEA AND VOMITING THAT IS NOT CONTROLLED WITH YOUR NAUSEA MEDICATION  *UNUSUAL SHORTNESS OF BREATH  *UNUSUAL BRUISING OR BLEEDING  TENDERNESS IN MOUTH AND THROAT WITH OR WITHOUT PRESENCE OF ULCERS  *URINARY PROBLEMS  *BOWEL PROBLEMS  UNUSUAL RASH Items with * indicate a potential emergency and should be followed up as soon as possible.  Feel free to call the clinic you have any questions or concerns. The clinic phone number is [phone].    

## 2013-08-08 ENCOUNTER — Ambulatory Visit (HOSPITAL_BASED_OUTPATIENT_CLINIC_OR_DEPARTMENT_OTHER): Payer: PRIVATE HEALTH INSURANCE

## 2013-08-08 ENCOUNTER — Ambulatory Visit: Payer: PRIVATE HEALTH INSURANCE

## 2013-08-08 VITALS — BP 121/61 | HR 81 | Temp 97.9°F

## 2013-08-08 DIAGNOSIS — C189 Malignant neoplasm of colon, unspecified: Secondary | ICD-10-CM

## 2013-08-08 DIAGNOSIS — Z452 Encounter for adjustment and management of vascular access device: Secondary | ICD-10-CM

## 2013-08-08 DIAGNOSIS — C187 Malignant neoplasm of sigmoid colon: Secondary | ICD-10-CM

## 2013-08-08 MED ORDER — SODIUM CHLORIDE 0.9 % IJ SOLN
10.0000 mL | INTRAMUSCULAR | Status: DC | PRN
  Administered 2013-08-08: 10 mL
  Filled 2013-08-08: qty 10

## 2013-08-08 MED ORDER — HEPARIN SOD (PORK) LOCK FLUSH 100 UNIT/ML IV SOLN
500.0000 [IU] | Freq: Once | INTRAVENOUS | Status: AC | PRN
  Administered 2013-08-08: 500 [IU]
  Filled 2013-08-08: qty 5

## 2013-08-19 ENCOUNTER — Other Ambulatory Visit: Payer: Self-pay | Admitting: Oncology

## 2013-08-20 ENCOUNTER — Ambulatory Visit (HOSPITAL_BASED_OUTPATIENT_CLINIC_OR_DEPARTMENT_OTHER): Payer: PRIVATE HEALTH INSURANCE

## 2013-08-20 ENCOUNTER — Telehealth: Payer: Self-pay | Admitting: Oncology

## 2013-08-20 ENCOUNTER — Ambulatory Visit (HOSPITAL_BASED_OUTPATIENT_CLINIC_OR_DEPARTMENT_OTHER): Payer: PRIVATE HEALTH INSURANCE | Admitting: Oncology

## 2013-08-20 ENCOUNTER — Other Ambulatory Visit (HOSPITAL_BASED_OUTPATIENT_CLINIC_OR_DEPARTMENT_OTHER): Payer: PRIVATE HEALTH INSURANCE | Admitting: Lab

## 2013-08-20 DIAGNOSIS — L299 Pruritus, unspecified: Secondary | ICD-10-CM

## 2013-08-20 DIAGNOSIS — D509 Iron deficiency anemia, unspecified: Secondary | ICD-10-CM

## 2013-08-20 DIAGNOSIS — C189 Malignant neoplasm of colon, unspecified: Secondary | ICD-10-CM

## 2013-08-20 DIAGNOSIS — C187 Malignant neoplasm of sigmoid colon: Secondary | ICD-10-CM

## 2013-08-20 DIAGNOSIS — R110 Nausea: Secondary | ICD-10-CM

## 2013-08-20 DIAGNOSIS — R109 Unspecified abdominal pain: Secondary | ICD-10-CM

## 2013-08-20 DIAGNOSIS — D696 Thrombocytopenia, unspecified: Secondary | ICD-10-CM

## 2013-08-20 DIAGNOSIS — Z5111 Encounter for antineoplastic chemotherapy: Secondary | ICD-10-CM

## 2013-08-20 DIAGNOSIS — D702 Other drug-induced agranulocytosis: Secondary | ICD-10-CM

## 2013-08-20 LAB — CBC WITH DIFFERENTIAL/PLATELET
Basophils Absolute: 0 10*3/uL (ref 0.0–0.1)
EOS%: 2.1 % (ref 0.0–7.0)
HCT: 31.9 % — ABNORMAL LOW (ref 34.8–46.6)
HGB: 10.1 g/dL — ABNORMAL LOW (ref 11.6–15.9)
MCH: 30 pg (ref 25.1–34.0)
MCV: 94.7 fL (ref 79.5–101.0)
MONO%: 20.1 % — ABNORMAL HIGH (ref 0.0–14.0)
NEUT%: 37.7 % — ABNORMAL LOW (ref 38.4–76.8)
Platelets: 127 10*3/uL — ABNORMAL LOW (ref 145–400)

## 2013-08-20 LAB — COMPREHENSIVE METABOLIC PANEL (CC13)
Albumin: 3.4 g/dL — ABNORMAL LOW (ref 3.5–5.0)
Alkaline Phosphatase: 106 U/L (ref 40–150)
BUN: 7.5 mg/dL (ref 7.0–26.0)
CO2: 26 mEq/L (ref 22–29)
Glucose: 84 mg/dl (ref 70–140)
Total Bilirubin: 0.75 mg/dL (ref 0.20–1.20)

## 2013-08-20 MED ORDER — SODIUM CHLORIDE 0.9 % IV SOLN
2400.0000 mg/m2 | INTRAVENOUS | Status: DC
  Administered 2013-08-20: 3800 mg via INTRAVENOUS
  Filled 2013-08-20: qty 76

## 2013-08-20 MED ORDER — PALONOSETRON HCL INJECTION 0.25 MG/5ML
INTRAVENOUS | Status: AC
  Filled 2013-08-20: qty 5

## 2013-08-20 MED ORDER — DEXAMETHASONE SODIUM PHOSPHATE 10 MG/ML IJ SOLN
INTRAMUSCULAR | Status: AC
  Filled 2013-08-20: qty 1

## 2013-08-20 MED ORDER — DEXAMETHASONE SODIUM PHOSPHATE 10 MG/ML IJ SOLN
10.0000 mg | Freq: Once | INTRAMUSCULAR | Status: AC
  Administered 2013-08-20: 10 mg via INTRAVENOUS

## 2013-08-20 MED ORDER — OXALIPLATIN CHEMO INJECTION 100 MG/20ML
85.0000 mg/m2 | Freq: Once | INTRAVENOUS | Status: AC
  Administered 2013-08-20: 135 mg via INTRAVENOUS
  Filled 2013-08-20: qty 27

## 2013-08-20 MED ORDER — PALONOSETRON HCL INJECTION 0.25 MG/5ML
0.2500 mg | Freq: Once | INTRAVENOUS | Status: AC
  Administered 2013-08-20: 0.25 mg via INTRAVENOUS

## 2013-08-20 MED ORDER — DEXTROSE 5 % IV SOLN
Freq: Once | INTRAVENOUS | Status: AC
  Administered 2013-08-20: 11:00:00 via INTRAVENOUS

## 2013-08-20 MED ORDER — LEUCOVORIN CALCIUM INJECTION 350 MG
400.0000 mg/m2 | Freq: Once | INTRAVENOUS | Status: AC
  Administered 2013-08-20: 632 mg via INTRAVENOUS
  Filled 2013-08-20: qty 31.6

## 2013-08-20 MED ORDER — SODIUM CHLORIDE 0.9 % IV SOLN
150.0000 mg | Freq: Once | INTRAVENOUS | Status: AC
  Administered 2013-08-20: 150 mg via INTRAVENOUS
  Filled 2013-08-20: qty 5

## 2013-08-20 MED ORDER — FLUOROURACIL CHEMO INJECTION 2.5 GM/50ML
400.0000 mg/m2 | Freq: Once | INTRAVENOUS | Status: AC
  Administered 2013-08-20: 650 mg via INTRAVENOUS
  Filled 2013-08-20: qty 13

## 2013-08-20 MED ORDER — DEXAMETHASONE 4 MG PO TABS: 4.0000 mg | ORAL_TABLET | Freq: Two times a day (BID) | ORAL | Status: DC

## 2013-08-20 NOTE — Patient Instructions (Signed)
[facility] Discharge Instructions for Patients Receiving Chemotherapy  Today you received the following chemotherapy agents FOLOX (oxaliplatin, 62fu, leucovorin)  To help prevent nausea and vomiting after your treatment, we encourage you to take your nausea medication as needed   If you develop nausea and vomiting that is not controlled by your nausea medication, call the clinic.   BELOW ARE SYMPTOMS THAT SHOULD BE REPORTED IMMEDIATELY:  *FEVER GREATER THAN 100.5 F  *CHILLS WITH OR WITHOUT FEVER  NAUSEA AND VOMITING THAT IS NOT CONTROLLED WITH YOUR NAUSEA MEDICATION  *UNUSUAL SHORTNESS OF BREATH  *UNUSUAL BRUISING OR BLEEDING  TENDERNESS IN MOUTH AND THROAT WITH OR WITHOUT PRESENCE OF ULCERS  *URINARY PROBLEMS  *BOWEL PROBLEMS  UNUSUAL RASH Items with * indicate a potential emergency and should be followed up as soon as possible.  Feel free to call the clinic you have any questions or concerns. The clinic phone number is [phone].

## 2013-08-20 NOTE — Progress Notes (Signed)
   [facility]    OFFICE PROGRESS NOTE   INTERVAL HISTORY:   She returns as scheduled. She completed another cycle of FOLFOX on 08/06/2013. No nausea. Alternating diarrhea and constipation. She has a dry mouth. Numbness over the tongue. Cold sensitivity lasted for several days following chemotherapy and she noted a "hot "feeling over the hands for 2-3 days after chemotherapy. No numbness today.  Objective:  Vital signs in last 24 hours:  Blood pressure 116/75, pulse 83, temperature 98.5 F (36.9 C), temperature source Oral, resp. rate 19, height 5\' 6"  (1.676 m), weight 128 lb 3.2 oz (58.151 kg).    HEENT: No thrush or ulcers Resp: Lungs clear bilaterally Cardio: Regular rate and rhythm GI: No hepatomegaly, nontender Vascular: No leg edema Neuro:? Mild decrease in vibratory sense at several the fingertips bilaterally, others are intact    Portacath/PICC-without erythema  Lab Results:  Lab Results  Component Value Date   WBC 3.4* 08/20/2013   HGB 10.1* 08/20/2013   HCT 31.9* 08/20/2013   MCV 94.7 08/20/2013   PLT 127* 08/20/2013   ANC 1.3    Medications: I have reviewed the patient's current medications.  Assessment/Plan: 1. 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  Cycle 1 adjuvant FOLFOX chemotherapy 05/28/2013. 2. History of Microcytic anemia. 3. Port-A-Cath placement 05/24/2013. 4. History of uterine fibroids. 5. Nausea following cycle 1 FOLFOX. Aloxi and prophylactic Decadron were added with cycle 2. She had nausea following cycle 2. Emend was added with cycle 3. Improved. No nausea following cycle 5 or 6. 6. Mild neutropenia secondary chemotherapy. She received Neulasta with cycle 4 FOLFOX and cycle 5 FOLFOX. She will receive Neulasta with cycle 6. 7. Abdominal discomfort following cycle 4 FOLFOX. Question gastritis related to steroids. She began Prilosec 20 mg  daily. 8. Mild thrombocytopenia 07/24/2013. Improved. 9. Pruritus and erythema over the palms beginning day 4 following cycle 5 FOLFOX and lasting approximately 3 days. Question hand-foot syndrome related to 5-fluorouracil, question allergic reaction. She was instructed to contact the office if she experiences this again. 10. ? 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 The plan is to proceed with cycle 7 today. She will receive Neulasta with this cycle. Ms. Barrie will return for an office visit and chemotherapy in 2 weeks.   Thornton Papas, MD  08/20/2013  10:44 AM

## 2013-08-20 NOTE — Telephone Encounter (Signed)
gv and printed appt sched and avs for pt for OCT...MW added tx.  °

## 2013-08-22 ENCOUNTER — Ambulatory Visit: Payer: PRIVATE HEALTH INSURANCE

## 2013-08-22 ENCOUNTER — Ambulatory Visit (HOSPITAL_BASED_OUTPATIENT_CLINIC_OR_DEPARTMENT_OTHER): Payer: PRIVATE HEALTH INSURANCE

## 2013-08-22 VITALS — BP 117/68 | HR 69 | Temp 97.8°F | Resp 20

## 2013-08-22 DIAGNOSIS — D702 Other drug-induced agranulocytosis: Secondary | ICD-10-CM

## 2013-08-22 MED ORDER — HEPARIN SOD (PORK) LOCK FLUSH 100 UNIT/ML IV SOLN
500.0000 [IU] | Freq: Once | INTRAVENOUS | Status: AC | PRN
  Administered 2013-08-22: 500 [IU]
  Filled 2013-08-22: qty 5

## 2013-08-22 MED ORDER — SODIUM CHLORIDE 0.9 % IJ SOLN
10.0000 mL | INTRAMUSCULAR | Status: DC | PRN
  Administered 2013-08-22: 10 mL
  Filled 2013-08-22: qty 10

## 2013-08-22 MED ORDER — PEGFILGRASTIM INJECTION 6 MG/0.6ML
6.0000 mg | Freq: Once | SUBCUTANEOUS | Status: AC
  Administered 2013-08-22: 6 mg via SUBCUTANEOUS
  Filled 2013-08-22: qty 0.6

## 2013-09-02 ENCOUNTER — Other Ambulatory Visit: Payer: Self-pay | Admitting: Oncology

## 2013-09-03 ENCOUNTER — Other Ambulatory Visit (HOSPITAL_BASED_OUTPATIENT_CLINIC_OR_DEPARTMENT_OTHER): Payer: PRIVATE HEALTH INSURANCE | Admitting: Lab

## 2013-09-03 ENCOUNTER — Ambulatory Visit (HOSPITAL_BASED_OUTPATIENT_CLINIC_OR_DEPARTMENT_OTHER): Payer: PRIVATE HEALTH INSURANCE

## 2013-09-03 ENCOUNTER — Telehealth: Payer: Self-pay | Admitting: Oncology

## 2013-09-03 ENCOUNTER — Ambulatory Visit (HOSPITAL_BASED_OUTPATIENT_CLINIC_OR_DEPARTMENT_OTHER): Payer: PRIVATE HEALTH INSURANCE | Admitting: Oncology

## 2013-09-03 VITALS — BP 116/70 | HR 99 | Temp 98.0°F | Resp 20 | Ht 66.0 in | Wt 130.4 lb

## 2013-09-03 DIAGNOSIS — Z5111 Encounter for antineoplastic chemotherapy: Secondary | ICD-10-CM

## 2013-09-03 DIAGNOSIS — C187 Malignant neoplasm of sigmoid colon: Secondary | ICD-10-CM

## 2013-09-03 DIAGNOSIS — N912 Amenorrhea, unspecified: Secondary | ICD-10-CM

## 2013-09-03 DIAGNOSIS — C189 Malignant neoplasm of colon, unspecified: Secondary | ICD-10-CM

## 2013-09-03 DIAGNOSIS — G620 Drug-induced polyneuropathy: Secondary | ICD-10-CM

## 2013-09-03 DIAGNOSIS — D6959 Other secondary thrombocytopenia: Secondary | ICD-10-CM

## 2013-09-03 DIAGNOSIS — D702 Other drug-induced agranulocytosis: Secondary | ICD-10-CM

## 2013-09-03 LAB — COMPREHENSIVE METABOLIC PANEL (CC13)
ALT: 33 U/L (ref 0–55)
AST: 33 U/L (ref 5–34)
Anion Gap: 9 mEq/L (ref 3–11)
BUN: 6.5 mg/dL — ABNORMAL LOW (ref 7.0–26.0)
CO2: 25 mEq/L (ref 22–29)
Calcium: 9.3 mg/dL (ref 8.4–10.4)
Chloride: 109 mEq/L (ref 98–109)
Creatinine: 0.6 mg/dL (ref 0.6–1.1)
Total Bilirubin: 0.62 mg/dL (ref 0.20–1.20)

## 2013-09-03 LAB — CBC WITH DIFFERENTIAL/PLATELET
BASO%: 0.8 % (ref 0.0–2.0)
Basophils Absolute: 0.1 10*3/uL (ref 0.0–0.1)
Eosinophils Absolute: 0.1 10*3/uL (ref 0.0–0.5)
HCT: 35.3 % (ref 34.8–46.6)
HGB: 11.3 g/dL — ABNORMAL LOW (ref 11.6–15.9)
LYMPH%: 20 % (ref 14.0–49.7)
MCH: 31.1 pg (ref 25.1–34.0)
MCHC: 32 g/dL (ref 31.5–36.0)
MCV: 97.2 fL (ref 79.5–101.0)
MONO%: 8.2 % (ref 0.0–14.0)
NEUT%: 70.5 % (ref 38.4–76.8)
Platelets: 102 10*3/uL — ABNORMAL LOW (ref 145–400)
RBC: 3.63 10*6/uL — ABNORMAL LOW (ref 3.70–5.45)
nRBC: 0 % (ref 0–0)

## 2013-09-03 MED ORDER — SODIUM CHLORIDE 0.9 % IV SOLN
2400.0000 mg/m2 | INTRAVENOUS | Status: DC
  Administered 2013-09-03: 3800 mg via INTRAVENOUS
  Filled 2013-09-03: qty 76

## 2013-09-03 MED ORDER — LEUCOVORIN CALCIUM INJECTION 350 MG
400.0000 mg/m2 | Freq: Once | INTRAVENOUS | Status: AC
  Administered 2013-09-03: 632 mg via INTRAVENOUS
  Filled 2013-09-03: qty 31.6

## 2013-09-03 MED ORDER — SODIUM CHLORIDE 0.9 % IV SOLN
Freq: Once | INTRAVENOUS | Status: AC
  Administered 2013-09-03: 11:00:00 via INTRAVENOUS

## 2013-09-03 MED ORDER — FLUOROURACIL CHEMO INJECTION 2.5 GM/50ML
400.0000 mg/m2 | Freq: Once | INTRAVENOUS | Status: AC
  Administered 2013-09-03: 650 mg via INTRAVENOUS
  Filled 2013-09-03: qty 13

## 2013-09-03 MED ORDER — LIDOCAINE-PRILOCAINE 2.5-2.5 % EX CREA
TOPICAL_CREAM | CUTANEOUS | Status: AC
  Filled 2013-09-03: qty 5

## 2013-09-03 NOTE — Progress Notes (Signed)
   [facility]    OFFICE PROGRESS NOTE   INTERVAL HISTORY:   She returns as scheduled. She completed another cycle of FOLFOX on 08/20/2013. Priscilla Hill reports fatigue and dizziness following chemotherapy. She had diarrhea for 3 days. The tongue is no arm and she has persistent mild numbness in the fingers/toes. She is dropping objects. She also noted 2 days of shortness of breath following chemotherapy. She has become amenorrheic. Minimal nausea. She had abdominal "pain" following chemotherapy.  Objective:  Vital signs in last 24 hours:  Blood pressure 116/70, pulse 99, temperature 98 F (36.7 C), temperature source Oral, resp. rate 20, height 5\' 6"  (1.676 m), weight 130 lb 6.4 oz (59.149 kg).    HEENT: No thrush or ulcers Resp: Lungs clear bilaterally Cardio: Regular rate and rhythm GI: No hepatomegaly, nontender Vascular: No leg edema Neuro: The vibratory sense is intact at the fingertips bilaterally  Skin: Palms without erythema   Portacath/PICC-without erythema  Lab Results:  Lab Results  Component Value Date   WBC 9.3 09/03/2013   HGB 11.3* 09/03/2013   HCT 35.3 09/03/2013   MCV 97.2 09/03/2013   PLT 102* 09/03/2013   ANC 6.6    Medications: I have reviewed the patient's current medications.  Assessment/Plan: 1. 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  Cycle 1 adjuvant FOLFOX chemotherapy 05/28/2013. 2. History of Microcytic anemia. 3. Port-A-Cath placement 05/24/2013. 4. History of uterine fibroids. 5. Nausea following cycle 1 FOLFOX. Aloxi and prophylactic Decadron were added with cycle 2. She had nausea following cycle 2. Emend was added with cycle 3. Improved. No nausea following cycle 5 or 6. 6. Mild neutropenia secondary chemotherapy. She received Neulasta with cycle 4 FOLFOX and cycle 5 FOLFOX. She will receive Neulasta with cycle 6. 7. Abdominal discomfort  following cycle 4 FOLFOX. Question gastritis related to steroids. She began Prilosec 20 mg daily. 8. Mild thrombocytopenia secondary chemotherapy 9. Pruritus and erythema over the palms beginning day 4 following cycle 5 FOLFOX and lasting approximately 3 days. Question hand-foot syndrome related to 5-fluorouracil, question allergic reaction. She was instructed to contact the office if she experiences this again. 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 Priscilla Hill has multiple complaints today including increased neuropathy symptoms. I suspect most of her symptoms are related to oxaliplatin. We decided to hold oxaliplatin with cycle 8 today. She will return for an office visit and chemotherapy in 2 weeks.  Oxaliplatin, antiemetic premedications, and Neulasta were discontinued from this cycle of chemotherapy.  The amenorrhea is likely secondary to chemotherapy. I explained the likelihood of permanent amenorrhea.   Thornton Papas, MD  09/03/2013  11:05 AM

## 2013-09-03 NOTE — Telephone Encounter (Signed)
gv and printed appt sched and avs for pt for OCT and NOV...emailed MW to add tx.Marland Kitchen

## 2013-09-03 NOTE — Patient Instructions (Signed)
[facility] Discharge Instructions for Patients Receiving Chemotherapy  Today you received the following chemotherapy agents :  Leucovorin, 5FU.  To help prevent nausea and vomiting after your treatment, we encourage you to take your nausea medication as instructed by your physician.   If you develop nausea and vomiting that is not controlled by your nausea medication, call the clinic.   BELOW ARE SYMPTOMS THAT SHOULD BE REPORTED IMMEDIATELY:  *FEVER GREATER THAN 100.5 F  *CHILLS WITH OR WITHOUT FEVER  NAUSEA AND VOMITING THAT IS NOT CONTROLLED WITH YOUR NAUSEA MEDICATION  *UNUSUAL SHORTNESS OF BREATH  *UNUSUAL BRUISING OR BLEEDING  TENDERNESS IN MOUTH AND THROAT WITH OR WITHOUT PRESENCE OF ULCERS  *URINARY PROBLEMS  *BOWEL PROBLEMS  UNUSUAL RASH Items with * indicate a potential emergency and should be followed up as soon as possible.  Feel free to call the clinic you have any questions or concerns. The clinic phone number is [phone].

## 2013-09-04 ENCOUNTER — Ambulatory Visit: Payer: PRIVATE HEALTH INSURANCE | Admitting: Oncology

## 2013-09-05 ENCOUNTER — Ambulatory Visit: Payer: PRIVATE HEALTH INSURANCE

## 2013-09-05 ENCOUNTER — Ambulatory Visit (HOSPITAL_BASED_OUTPATIENT_CLINIC_OR_DEPARTMENT_OTHER): Payer: PRIVATE HEALTH INSURANCE

## 2013-09-05 VITALS — BP 110/67 | HR 84 | Temp 98.6°F | Resp 18

## 2013-09-05 DIAGNOSIS — Z452 Encounter for adjustment and management of vascular access device: Secondary | ICD-10-CM

## 2013-09-05 DIAGNOSIS — C189 Malignant neoplasm of colon, unspecified: Secondary | ICD-10-CM

## 2013-09-05 DIAGNOSIS — C187 Malignant neoplasm of sigmoid colon: Secondary | ICD-10-CM

## 2013-09-05 MED ORDER — HEPARIN SOD (PORK) LOCK FLUSH 100 UNIT/ML IV SOLN
500.0000 [IU] | Freq: Once | INTRAVENOUS | Status: AC | PRN
  Administered 2013-09-05: 500 [IU]
  Filled 2013-09-05: qty 5

## 2013-09-05 MED ORDER — SODIUM CHLORIDE 0.9 % IJ SOLN
10.0000 mL | INTRAMUSCULAR | Status: DC | PRN
  Administered 2013-09-05: 10 mL
  Filled 2013-09-05: qty 10

## 2013-09-16 ENCOUNTER — Other Ambulatory Visit: Payer: Self-pay | Admitting: Oncology

## 2013-09-17 ENCOUNTER — Ambulatory Visit (HOSPITAL_BASED_OUTPATIENT_CLINIC_OR_DEPARTMENT_OTHER): Payer: PRIVATE HEALTH INSURANCE | Admitting: Nurse Practitioner

## 2013-09-17 ENCOUNTER — Other Ambulatory Visit: Payer: Self-pay | Admitting: Nurse Practitioner

## 2013-09-17 ENCOUNTER — Other Ambulatory Visit (HOSPITAL_BASED_OUTPATIENT_CLINIC_OR_DEPARTMENT_OTHER): Payer: PRIVATE HEALTH INSURANCE | Admitting: Lab

## 2013-09-17 ENCOUNTER — Telehealth: Payer: Self-pay | Admitting: Oncology

## 2013-09-17 ENCOUNTER — Ambulatory Visit (HOSPITAL_BASED_OUTPATIENT_CLINIC_OR_DEPARTMENT_OTHER): Payer: PRIVATE HEALTH INSURANCE

## 2013-09-17 ENCOUNTER — Telehealth: Payer: Self-pay | Admitting: *Deleted

## 2013-09-17 VITALS — BP 110/58 | HR 79 | Temp 98.2°F | Resp 18 | Ht 66.0 in | Wt 134.9 lb

## 2013-09-17 DIAGNOSIS — G622 Polyneuropathy due to other toxic agents: Secondary | ICD-10-CM

## 2013-09-17 DIAGNOSIS — D6959 Other secondary thrombocytopenia: Secondary | ICD-10-CM

## 2013-09-17 DIAGNOSIS — Z5111 Encounter for antineoplastic chemotherapy: Secondary | ICD-10-CM

## 2013-09-17 DIAGNOSIS — C189 Malignant neoplasm of colon, unspecified: Secondary | ICD-10-CM

## 2013-09-17 DIAGNOSIS — C187 Malignant neoplasm of sigmoid colon: Secondary | ICD-10-CM

## 2013-09-17 DIAGNOSIS — T451X5A Adverse effect of antineoplastic and immunosuppressive drugs, initial encounter: Secondary | ICD-10-CM

## 2013-09-17 LAB — COMPREHENSIVE METABOLIC PANEL (CC13)
ALT: 25 U/L (ref 0–55)
AST: 30 U/L (ref 5–34)
Albumin: 3.1 g/dL — ABNORMAL LOW (ref 3.5–5.0)
Alkaline Phosphatase: 85 U/L (ref 40–150)
BUN: 8.1 mg/dL (ref 7.0–26.0)
Calcium: 9.2 mg/dL (ref 8.4–10.4)
Chloride: 110 mEq/L — ABNORMAL HIGH (ref 98–109)
Creatinine: 0.7 mg/dL (ref 0.6–1.1)
Potassium: 4.1 mEq/L (ref 3.5–5.1)
Total Bilirubin: 0.8 mg/dL (ref 0.20–1.20)

## 2013-09-17 LAB — CBC WITH DIFFERENTIAL/PLATELET
BASO%: 0.5 % (ref 0.0–2.0)
EOS%: 1.3 % (ref 0.0–7.0)
HCT: 32.4 % — ABNORMAL LOW (ref 34.8–46.6)
LYMPH%: 21.3 % (ref 14.0–49.7)
MCH: 33 pg (ref 25.1–34.0)
MCHC: 33.1 g/dL (ref 31.5–36.0)
MCV: 99.8 fL (ref 79.5–101.0)
MONO%: 16.8 % — ABNORMAL HIGH (ref 0.0–14.0)
NEUT%: 60.1 % (ref 38.4–76.8)
RDW: 18.9 % — ABNORMAL HIGH (ref 11.2–14.5)
lymph#: 1 10*3/uL (ref 0.9–3.3)

## 2013-09-17 MED ORDER — FLUOROURACIL CHEMO INJECTION 2.5 GM/50ML
400.0000 mg/m2 | Freq: Once | INTRAVENOUS | Status: AC
  Administered 2013-09-17: 650 mg via INTRAVENOUS
  Filled 2013-09-17: qty 13

## 2013-09-17 MED ORDER — LEUCOVORIN CALCIUM INJECTION 350 MG
400.0000 mg/m2 | Freq: Once | INTRAVENOUS | Status: AC
  Administered 2013-09-17: 632 mg via INTRAVENOUS
  Filled 2013-09-17: qty 31.6

## 2013-09-17 MED ORDER — DEXTROSE 5 % IV SOLN
Freq: Once | INTRAVENOUS | Status: AC
  Administered 2013-09-17: 12:00:00 via INTRAVENOUS

## 2013-09-17 MED ORDER — SODIUM CHLORIDE 0.9 % IV SOLN
2400.0000 mg/m2 | INTRAVENOUS | Status: DC
  Administered 2013-09-17: 3800 mg via INTRAVENOUS
  Filled 2013-09-17: qty 76

## 2013-09-17 NOTE — Telephone Encounter (Signed)
Gave pt appt for lab, md and chemo for November 2014, eamiled Marcelino Duster regarding chemo on 11/24th

## 2013-09-17 NOTE — Telephone Encounter (Signed)
Per staff message and POF I have scheduled appts.  JMW  

## 2013-09-17 NOTE — Patient Instructions (Signed)
[facility] Discharge Instructions for Patients Receiving Chemotherapy  Today you received the following chemotherapy agents Leukovorin/5FU  To help prevent nausea and vomiting after your treatment, we encourage you to take your nausea medication as directed   If you develop nausea and vomiting that is not controlled by your nausea medication, call the clinic.   BELOW ARE SYMPTOMS THAT SHOULD BE REPORTED IMMEDIATELY:  *FEVER GREATER THAN 100.5 F  *CHILLS WITH OR WITHOUT FEVER  NAUSEA AND VOMITING THAT IS NOT CONTROLLED WITH YOUR NAUSEA MEDICATION  *UNUSUAL SHORTNESS OF BREATH  *UNUSUAL BRUISING OR BLEEDING  TENDERNESS IN MOUTH AND THROAT WITH OR WITHOUT PRESENCE OF ULCERS  *URINARY PROBLEMS  *BOWEL PROBLEMS  UNUSUAL RASH Items with * indicate a potential emergency and should be followed up as soon as possible.  Feel free to call the clinic you have any questions or concerns. The clinic phone number is [phone].

## 2013-09-17 NOTE — Progress Notes (Signed)
Per NP no Oxali and no premeds.

## 2013-09-17 NOTE — Progress Notes (Signed)
OFFICE PROGRESS NOTE  Interval history:  Priscilla Hill returns as scheduled. She completed cycle 8 FOLFOX 09/03/2013. The oxaliplatin was held due to increased neuropathy symptoms.  She denies nausea/vomiting. No mouth sores. No diarrhea. She did not have abdominal pain following the recent chemotherapy. She has persistent numbness in the fingertips and toes. She thinks the numbness is worse. She notes intermittent tingling of the tongue.  She notes swelling of the feet and fingers. She has dry "peeling" skin on the feet. She reports a good appetite.   Objective: Blood pressure 110/58, pulse 79, temperature 98.2 F (36.8 C), temperature source Oral, resp. rate 18, height 5\' 6"  (1.676 m), weight 134 lb 14.4 oz (61.19 kg).  Oropharynx is without thrush or ulceration. Lungs are clear. Regular cardiac rhythm. Port-A-Cath site is without erythema. Abdomen is soft and nontender. No hepatomegaly. Trace bilateral pretibial and ankle edema. Calves soft and nontender. Balls of the feet with mild dry peeling skin. Vibratory sense mildly to moderately decreased over the fingertips on the right hand and intact over the fingertips on the left hand per tuning fork exam.  Lab Results: Lab Results  Component Value Date   WBC 4.7 09/17/2013   HGB 10.7* 09/17/2013   HCT 32.4* 09/17/2013   MCV 99.8 09/17/2013   PLT 121* 09/17/2013    Chemistry:    Chemistry      Component Value Date/Time   NA 144 09/17/2013 0943   NA 140 05/23/2013 1105   K 4.1 09/17/2013 0943   K 3.8 05/23/2013 1105   CL 103 05/23/2013 1105   CO2 25 09/17/2013 0943   CO2 29 05/23/2013 1105   BUN 8.1 09/17/2013 0943   BUN 8 05/23/2013 1105   CREATININE 0.7 09/17/2013 0943   CREATININE 0.60 05/23/2013 1105      Component Value Date/Time   CALCIUM 9.2 09/17/2013 0943   CALCIUM 9.6 05/23/2013 1105   ALKPHOS 85 09/17/2013 0943   ALKPHOS 59 05/23/2013 1105   AST 30 09/17/2013 0943   AST 16 05/23/2013 1105   ALT 25 09/17/2013 0943   ALT 19 05/23/2013  1105   BILITOT 0.80 09/17/2013 0943   BILITOT 0.5 05/23/2013 1105       Studies/Results: No results found.  Medications: I have reviewed the patient's current medications.  Assessment/Plan:  1. 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  Cycle 1 adjuvant FOLFOX chemotherapy 05/28/2013. Oxaliplatin was held with cycle 8 due to neuropathy symptoms. 2. History of Microcytic anemia. 3. Port-A-Cath placement 05/24/2013. 4. History of uterine fibroids. 5. Nausea following cycle 1 FOLFOX. Aloxi and prophylactic Decadron were added with cycle 2. She had nausea following cycle 2. Emend was added with cycle 3. Improved. No nausea following cycle 5 or 6. 6. Mild neutropenia secondary chemotherapy. She received Neulasta with cycle 4 FOLFOX and cycle 5 FOLFOX. She will receive Neulasta with cycle 6. 7. Abdominal discomfort following cycle 4 FOLFOX. Question gastritis related to steroids. She began Prilosec 20 mg daily. 8. Mild thrombocytopenia secondary chemotherapy 9. Pruritus and erythema over the palms beginning day 4 following cycle 5 FOLFOX and lasting approximately 3 days. Question hand-foot syndrome related to 5-fluorouracil, question allergic reaction. She was instructed to contact the office if she experiences this again. 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 11. Trace bilateral leg edema. Question due to steroids, question due to hypoalbuminemia.  Disposition-she appears stable. She has completed 8 cycles of FOLFOX. Oxaliplatin was  held with cycle 8 due to neuropathy symptoms. Plan to proceed with cycle 9 today as scheduled. Oxaliplatin will be held again today.  She will return for a followup visit and cycle 10 in 2 weeks. She will contact the office in the interim with any problems.  She is interested in meeting with the genetics counselor. She would like to make sure this will be  covered by her insurance prior to a referral being made. She will let us know at the time of her next visit.  Plan reviewed with Dr. Truett Perna.   Priscilla Hill ANP/GNP-BC

## 2013-09-19 ENCOUNTER — Ambulatory Visit (HOSPITAL_BASED_OUTPATIENT_CLINIC_OR_DEPARTMENT_OTHER): Payer: PRIVATE HEALTH INSURANCE

## 2013-09-19 ENCOUNTER — Ambulatory Visit: Payer: PRIVATE HEALTH INSURANCE

## 2013-09-19 VITALS — BP 116/71 | HR 88 | Temp 99.5°F | Resp 18

## 2013-09-19 DIAGNOSIS — C187 Malignant neoplasm of sigmoid colon: Secondary | ICD-10-CM

## 2013-09-19 DIAGNOSIS — C189 Malignant neoplasm of colon, unspecified: Secondary | ICD-10-CM

## 2013-09-19 DIAGNOSIS — Z452 Encounter for adjustment and management of vascular access device: Secondary | ICD-10-CM

## 2013-09-19 MED ORDER — HEPARIN SOD (PORK) LOCK FLUSH 100 UNIT/ML IV SOLN
500.0000 [IU] | Freq: Once | INTRAVENOUS | Status: AC | PRN
  Administered 2013-09-19: 500 [IU]
  Filled 2013-09-19: qty 5

## 2013-09-19 MED ORDER — SODIUM CHLORIDE 0.9 % IJ SOLN
10.0000 mL | INTRAMUSCULAR | Status: DC | PRN
  Administered 2013-09-19: 10 mL
  Filled 2013-09-19: qty 10

## 2013-09-19 NOTE — Patient Instructions (Signed)
Call MD with any problems or concerns 

## 2013-09-20 ENCOUNTER — Telehealth: Payer: Self-pay | Admitting: Oncology

## 2013-09-20 NOTE — Telephone Encounter (Signed)
Talked to pt and gave her appt for Nobember 2014 lab Md and chemo

## 2013-09-27 ENCOUNTER — Other Ambulatory Visit: Payer: Self-pay

## 2013-09-30 ENCOUNTER — Other Ambulatory Visit: Payer: Self-pay | Admitting: Oncology

## 2013-10-01 ENCOUNTER — Other Ambulatory Visit (HOSPITAL_BASED_OUTPATIENT_CLINIC_OR_DEPARTMENT_OTHER): Payer: PRIVATE HEALTH INSURANCE | Admitting: Lab

## 2013-10-01 ENCOUNTER — Ambulatory Visit (HOSPITAL_BASED_OUTPATIENT_CLINIC_OR_DEPARTMENT_OTHER): Payer: PRIVATE HEALTH INSURANCE

## 2013-10-01 ENCOUNTER — Telehealth: Payer: Self-pay | Admitting: Oncology

## 2013-10-01 ENCOUNTER — Ambulatory Visit (HOSPITAL_BASED_OUTPATIENT_CLINIC_OR_DEPARTMENT_OTHER): Payer: PRIVATE HEALTH INSURANCE | Admitting: Oncology

## 2013-10-01 ENCOUNTER — Telehealth: Payer: Self-pay | Admitting: *Deleted

## 2013-10-01 VITALS — BP 119/72 | HR 82 | Temp 98.3°F | Resp 19 | Ht 66.0 in | Wt 134.1 lb

## 2013-10-01 DIAGNOSIS — C187 Malignant neoplasm of sigmoid colon: Secondary | ICD-10-CM

## 2013-10-01 DIAGNOSIS — Z5111 Encounter for antineoplastic chemotherapy: Secondary | ICD-10-CM

## 2013-10-01 DIAGNOSIS — C189 Malignant neoplasm of colon, unspecified: Secondary | ICD-10-CM

## 2013-10-01 DIAGNOSIS — G620 Drug-induced polyneuropathy: Secondary | ICD-10-CM

## 2013-10-01 DIAGNOSIS — D702 Other drug-induced agranulocytosis: Secondary | ICD-10-CM

## 2013-10-01 DIAGNOSIS — D6959 Other secondary thrombocytopenia: Secondary | ICD-10-CM

## 2013-10-01 LAB — COMPREHENSIVE METABOLIC PANEL (CC13)
ALT: 24 U/L (ref 0–55)
AST: 29 U/L (ref 5–34)
Alkaline Phosphatase: 94 U/L (ref 40–150)
Creatinine: 0.6 mg/dL (ref 0.6–1.1)
Total Bilirubin: 0.79 mg/dL (ref 0.20–1.20)

## 2013-10-01 LAB — CBC WITH DIFFERENTIAL/PLATELET
BASO%: 1.1 % (ref 0.0–2.0)
Basophils Absolute: 0.1 10*3/uL (ref 0.0–0.1)
EOS%: 1.9 % (ref 0.0–7.0)
LYMPH%: 25.4 % (ref 14.0–49.7)
MCHC: 32.9 g/dL (ref 31.5–36.0)
MCV: 99.4 fL (ref 79.5–101.0)
MONO#: 0.7 10*3/uL (ref 0.1–0.9)
MONO%: 11.6 % (ref 0.0–14.0)
Platelets: 150 10*3/uL (ref 145–400)
RBC: 3.55 10*6/uL — ABNORMAL LOW (ref 3.70–5.45)
WBC: 6.2 10*3/uL (ref 3.9–10.3)
nRBC: 0 % (ref 0–0)

## 2013-10-01 MED ORDER — FLUOROURACIL CHEMO INJECTION 5 GM/100ML
2400.0000 mg/m2 | INTRAVENOUS | Status: DC
  Administered 2013-10-01: 3800 mg via INTRAVENOUS
  Filled 2013-10-01: qty 76

## 2013-10-01 MED ORDER — FLUOROURACIL CHEMO INJECTION 2.5 GM/50ML
400.0000 mg/m2 | Freq: Once | INTRAVENOUS | Status: AC
  Administered 2013-10-01: 650 mg via INTRAVENOUS
  Filled 2013-10-01: qty 13

## 2013-10-01 MED ORDER — DEXTROSE 5 % IV SOLN
Freq: Once | INTRAVENOUS | Status: AC
  Administered 2013-10-01: 12:00:00 via INTRAVENOUS

## 2013-10-01 MED ORDER — LEUCOVORIN CALCIUM INJECTION 350 MG
400.0000 mg/m2 | Freq: Once | INTRAVENOUS | Status: AC
  Administered 2013-10-01: 632 mg via INTRAVENOUS
  Filled 2013-10-01: qty 31.6

## 2013-10-01 NOTE — Telephone Encounter (Signed)
Per staff message and POF I have scheduled appts.  JMW  

## 2013-10-01 NOTE — Progress Notes (Signed)
Provided patient with Preparing Advanced Directives Package. Patient declines flu vaccine.

## 2013-10-01 NOTE — Progress Notes (Signed)
   [facility]    OFFICE PROGRESS NOTE   INTERVAL HISTORY:   She returns for scheduled followup of colon cancer. She completed a cycle of 5-fluorouracil on 09/17/2013. No nausea or diarrhea following chemotherapy. She continues to have numbness and pain in the fingers and feet. She feels like the fingers and toes are "swollen ". These symptoms do not interfere with activities.  Objective:  Vital signs in last 24 hours:  Blood pressure 119/72, pulse 82, temperature 98.3 F (36.8 C), temperature source Oral, resp. rate 19, height 5\' 6"  (1.676 m), weight 134 lb 1.6 oz (60.827 kg), SpO2 100.00%.    HEENT: No thrush or ulcers Resp: Lungs clear bilaterally Cardio: Regular rate and rhythm GI: No hepatomegaly, nontender Vascular: No leg, foot, or hand edema is apparent Neuro: Very mild decrease in vibratory sense at the fingertips bilaterally  Skin: Erythema at the right anterior chest medial and superior to the Port-A-Cath site. Mild erythema over the hands and feet  Portacath/PICC-without tenderness  Lab Results:  Lab Results  Component Value Date   WBC 6.2 10/01/2013   HGB 11.6 10/01/2013   HCT 35.3 10/01/2013   MCV 99.4 10/01/2013   PLT 150 10/01/2013   ANC 3.7    Medications: I have reviewed the patient's current medications.  Assessment/Plan: 1. 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  Cycle 1 adjuvant FOLFOX chemotherapy 05/28/2013.  Oxaliplatin was held beginning with cycle 8 due to neuropathy symptoms. 2. History of Microcytic anemia. 3. Port-A-Cath placement 05/24/2013. 4. History of uterine fibroids. 5. Nausea following cycle 1 FOLFOX. Aloxi and prophylactic Decadron were added with cycle 2. She had nausea following cycle 2. Emend was added with cycle 3. Improved. No nausea following cycle 5 or 6. 6. Mild neutropenia secondary chemotherapy. She received Neulasta  beginning with cycle 4 FOLFOX  7. Abdominal discomfort following cycle 4 FOLFOX. Question gastritis related to steroids. She began Prilosec 20 mg daily. 8. Mild thrombocytopenia secondary chemotherapy-improved 9. Pruritus and erythema over the palms beginning day 4 following cycle 5 FOLFOX and lasting approximately 3 days. Question hand-foot syndrome related to 5-fluorouracil, question allergic reaction. She was instructed to contact the office if she experiences this again. 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   Disposition:  She appears stable. She continues to have symptoms related to oxaliplatin neuropathy. Oxaliplatin will again be held with cycle 10 FOLFOX today. She will return for an office visit and chemotherapy in 2 weeks.   Thornton Papas, MD  10/01/2013  11:07 AM

## 2013-10-01 NOTE — Patient Instructions (Signed)
[facility] Discharge Instructions for Patients Receiving Chemotherapy  Today you received the following chemotherapy agents: Leucovorin, 61fu. To help prevent nausea and vomiting after your treatment, we encourage you to take your nausea medication.  If you develop nausea and vomiting that is not controlled by your nausea medication, call the clinic.   BELOW ARE SYMPTOMS THAT SHOULD BE REPORTED IMMEDIATELY:  *FEVER GREATER THAN 100.5 F  *CHILLS WITH OR WITHOUT FEVER  NAUSEA AND VOMITING THAT IS NOT CONTROLLED WITH YOUR NAUSEA MEDICATION  *UNUSUAL SHORTNESS OF BREATH  *UNUSUAL BRUISING OR BLEEDING  TENDERNESS IN MOUTH AND THROAT WITH OR WITHOUT PRESENCE OF ULCERS  *URINARY PROBLEMS  *BOWEL PROBLEMS  UNUSUAL RASH Items with * indicate a potential emergency and should be followed up as soon as possible.  Feel free to call the clinic you have any questions or concerns. The clinic phone number is [phone].

## 2013-10-01 NOTE — Telephone Encounter (Signed)
gave pt appt for lab and Md, emailed Marcelino Duster regarding chemo on December 2014

## 2013-10-01 NOTE — Progress Notes (Signed)
Pt c/o severe irritation with sorbview after last treatment. Tegarderm used today. Pt given tape to secure edges if dressing starts to peel up.

## 2013-10-02 ENCOUNTER — Telehealth: Payer: Self-pay | Admitting: Oncology

## 2013-10-02 NOTE — Telephone Encounter (Signed)
called pt and left message regarding chemo,md and lab for november and December

## 2013-10-03 ENCOUNTER — Ambulatory Visit (HOSPITAL_BASED_OUTPATIENT_CLINIC_OR_DEPARTMENT_OTHER): Payer: PRIVATE HEALTH INSURANCE

## 2013-10-03 DIAGNOSIS — C189 Malignant neoplasm of colon, unspecified: Secondary | ICD-10-CM

## 2013-10-03 DIAGNOSIS — C187 Malignant neoplasm of sigmoid colon: Secondary | ICD-10-CM

## 2013-10-03 MED ORDER — HEPARIN SOD (PORK) LOCK FLUSH 100 UNIT/ML IV SOLN
500.0000 [IU] | Freq: Once | INTRAVENOUS | Status: AC | PRN
  Administered 2013-10-03: 500 [IU]
  Filled 2013-10-03: qty 5

## 2013-10-03 MED ORDER — SODIUM CHLORIDE 0.9 % IJ SOLN
10.0000 mL | INTRAMUSCULAR | Status: DC | PRN
  Administered 2013-10-03: 10 mL
  Filled 2013-10-03: qty 10

## 2013-10-03 NOTE — Patient Instructions (Signed)

## 2013-10-14 ENCOUNTER — Other Ambulatory Visit: Payer: Self-pay | Admitting: Oncology

## 2013-10-15 ENCOUNTER — Other Ambulatory Visit (HOSPITAL_BASED_OUTPATIENT_CLINIC_OR_DEPARTMENT_OTHER): Payer: PRIVATE HEALTH INSURANCE | Admitting: Lab

## 2013-10-15 ENCOUNTER — Telehealth: Payer: Self-pay | Admitting: Oncology

## 2013-10-15 ENCOUNTER — Ambulatory Visit (HOSPITAL_BASED_OUTPATIENT_CLINIC_OR_DEPARTMENT_OTHER): Payer: PRIVATE HEALTH INSURANCE

## 2013-10-15 ENCOUNTER — Ambulatory Visit (HOSPITAL_BASED_OUTPATIENT_CLINIC_OR_DEPARTMENT_OTHER): Payer: PRIVATE HEALTH INSURANCE | Admitting: Oncology

## 2013-10-15 VITALS — BP 113/63 | HR 81 | Temp 98.7°F | Resp 18 | Ht 66.0 in | Wt 134.6 lb

## 2013-10-15 DIAGNOSIS — C187 Malignant neoplasm of sigmoid colon: Secondary | ICD-10-CM

## 2013-10-15 DIAGNOSIS — D702 Other drug-induced agranulocytosis: Secondary | ICD-10-CM

## 2013-10-15 DIAGNOSIS — D6959 Other secondary thrombocytopenia: Secondary | ICD-10-CM

## 2013-10-15 DIAGNOSIS — C189 Malignant neoplasm of colon, unspecified: Secondary | ICD-10-CM

## 2013-10-15 DIAGNOSIS — G620 Drug-induced polyneuropathy: Secondary | ICD-10-CM

## 2013-10-15 DIAGNOSIS — K2960 Other gastritis without bleeding: Secondary | ICD-10-CM

## 2013-10-15 DIAGNOSIS — Z5111 Encounter for antineoplastic chemotherapy: Secondary | ICD-10-CM

## 2013-10-15 LAB — CBC WITH DIFFERENTIAL/PLATELET
BASO%: 0.6 % (ref 0.0–2.0)
EOS%: 2.6 % (ref 0.0–7.0)
HCT: 36.1 % (ref 34.8–46.6)
MCH: 32.7 pg (ref 25.1–34.0)
MCHC: 32.7 g/dL (ref 31.5–36.0)
MCV: 100 fL (ref 79.5–101.0)
MONO#: 0.6 10*3/uL (ref 0.1–0.9)
MONO%: 11.3 % (ref 0.0–14.0)
NEUT%: 61.1 % (ref 38.4–76.8)
Platelets: 123 10*3/uL — ABNORMAL LOW (ref 145–400)
RBC: 3.61 10*6/uL — ABNORMAL LOW (ref 3.70–5.45)
RDW: 14.4 % (ref 11.2–14.5)
lymph#: 1.3 10*3/uL (ref 0.9–3.3)

## 2013-10-15 LAB — COMPREHENSIVE METABOLIC PANEL (CC13)
ALT: 22 U/L (ref 0–55)
AST: 25 U/L (ref 5–34)
Albumin: 3.5 g/dL (ref 3.5–5.0)
Alkaline Phosphatase: 82 U/L (ref 40–150)
CO2: 24 mEq/L (ref 22–29)
Calcium: 9.2 mg/dL (ref 8.4–10.4)
Chloride: 108 mEq/L (ref 98–109)
Creatinine: 0.6 mg/dL (ref 0.6–1.1)
Potassium: 3.5 mEq/L (ref 3.5–5.1)
Total Protein: 6.6 g/dL (ref 6.4–8.3)

## 2013-10-15 MED ORDER — FLUOROURACIL CHEMO INJECTION 2.5 GM/50ML
400.0000 mg/m2 | Freq: Once | INTRAVENOUS | Status: AC
  Administered 2013-10-15: 650 mg via INTRAVENOUS
  Filled 2013-10-15: qty 13

## 2013-10-15 MED ORDER — SODIUM CHLORIDE 0.9 % IV SOLN
2400.0000 mg/m2 | INTRAVENOUS | Status: DC
  Administered 2013-10-15: 3800 mg via INTRAVENOUS
  Filled 2013-10-15: qty 76

## 2013-10-15 MED ORDER — DEXTROSE 5 % IV SOLN
400.0000 mg/m2 | Freq: Once | INTRAVENOUS | Status: AC
  Administered 2013-10-15: 632 mg via INTRAVENOUS
  Filled 2013-10-15: qty 31.6

## 2013-10-15 NOTE — Patient Instructions (Addendum)
[facility] Discharge Instructions for Patients Receiving Chemotherapy  Today you received the following chemotherapy agents: Leucovorin and Fluorouracil.   To help prevent nausea and vomiting after your treatment, we encourage you to take your nausea medication, Compazine. Take one every six hours as needed.   If you develop nausea and vomiting that is not controlled by your nausea medication, call the clinic.   BELOW ARE SYMPTOMS THAT SHOULD BE REPORTED IMMEDIATELY:  *FEVER GREATER THAN 100.5 F  *CHILLS WITH OR WITHOUT FEVER  NAUSEA AND VOMITING THAT IS NOT CONTROLLED WITH YOUR NAUSEA MEDICATION  *UNUSUAL SHORTNESS OF BREATH  *UNUSUAL BRUISING OR BLEEDING  TENDERNESS IN MOUTH AND THROAT WITH OR WITHOUT PRESENCE OF ULCERS  *URINARY PROBLEMS  *BOWEL PROBLEMS  UNUSUAL RASH Items with * indicate a potential emergency and should be followed up as soon as possible.  Feel free to call the clinic should you have any questions or concerns. The clinic phone number is [phone].

## 2013-10-15 NOTE — Progress Notes (Signed)
   [facility]    OFFICE PROGRESS NOTE   INTERVAL HISTORY:   Ms. Priscilla Hill returns for scheduled followup of colon cancer. She completed a cycle of 5-FU/leucovorin on 10/01/2013. No nausea, mouth sores, or diarrhea following chemotherapy. She continues to have neuropathy symptoms. She reports numbness in the feet extending to the lower legs. She has pain in the feet with ambulation. She continues to have numbness and tingling in the fingers. This does not interfere with activities. She has developed a rash at the right side of the face.  Objective:  Vital signs in last 24 hours:  Blood pressure 113/63, pulse 81, temperature 98.7 F (37.1 C), temperature source Oral, resp. rate 18, height 5\' 6"  (1.676 m), weight 134 lb 9.6 oz (61.054 kg).    HEENT: No thrush or ulcers Resp: Lungs clear bilaterally Cardio: Regular rate and rhythm GI: No hepatomegaly, nontender Vascular: No leg edema  Skin: Palms and soles without erythema. Mild erythematous rash at the right side of the face,? Acne type rash  Neurologic: Mild decrease in vibratory sense at the fingertips bilaterally. Sensation intact to light touch at the feet  Portacath/PICC-without erythema  Lab Results:  Lab Results  Component Value Date   WBC 5.3 10/15/2013   HGB 11.8 10/15/2013   HCT 36.1 10/15/2013   MCV 100.0 10/15/2013   PLT 123* 10/15/2013   ANC 3.3    Medications: I have reviewed the patient's current medications.  Assessment/Plan: 1. 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 The tumor returned microsatellite stable with no loss of mismatch repair protein expression. Cycle 1 adjuvant FOLFOX chemotherapy 05/28/2013.  Oxaliplatin was held beginning with cycle 8 due to neuropathy symptoms. 2. History of Microcytic anemia. 3. Port-A-Cath placement 05/24/2013. 4. History of uterine fibroids. 5. Nausea following cycle 1  FOLFOX. Aloxi and prophylactic Decadron were added with cycle 2. She had nausea following cycle 2. Emend was added with cycle 3. Improved. No nausea following cycle 5 or 6. 6. Mild neutropenia secondary chemotherapy. She received Neulasta beginning with cycle 4 FOLFOX  7. Abdominal discomfort following cycle 4 FOLFOX. Question gastritis related to steroids. She began Prilosec 20 mg daily. 8. Mild thrombocytopenia secondary chemotherapy-improved 9. Pruritus and erythema over the palms beginning day 4 following cycle 5 FOLFOX and lasting approximately 3 days. Question hand-foot syndrome related to 5-fluorouracil, question allergic reaction. She was instructed to contact the office if she experiences this again. 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  Disposition:  She has developed oxaliplatin neuropathy. Oxaliplatin will continue to be held from the adjuvant chemotherapy. She will complete cycle 11 of adjuvant therapy today. She will return for an office visit and the final cycle of adjuvant chemotherapy in 2 weeks.  The sigmoid colon tumor returned microsatellite stable with no loss of mismatch repair protein expression. She does not appear to have hereditary non-polyposis colon cancer. She understands her children are at increased risk of developing colon cancer and will be sure they obtain appropriate screening.   Thornton Papas, MD  10/15/2013  10:24 AM

## 2013-10-15 NOTE — Telephone Encounter (Signed)
per 11/24 pof f/u as scheduled 12/8.

## 2013-10-17 ENCOUNTER — Ambulatory Visit (HOSPITAL_BASED_OUTPATIENT_CLINIC_OR_DEPARTMENT_OTHER): Payer: PRIVATE HEALTH INSURANCE

## 2013-10-17 VITALS — BP 119/69 | HR 72 | Temp 97.8°F

## 2013-10-17 DIAGNOSIS — D702 Other drug-induced agranulocytosis: Secondary | ICD-10-CM

## 2013-10-17 DIAGNOSIS — C187 Malignant neoplasm of sigmoid colon: Secondary | ICD-10-CM

## 2013-10-17 DIAGNOSIS — C189 Malignant neoplasm of colon, unspecified: Secondary | ICD-10-CM

## 2013-10-17 MED ORDER — HEPARIN SOD (PORK) LOCK FLUSH 100 UNIT/ML IV SOLN
500.0000 [IU] | Freq: Once | INTRAVENOUS | Status: AC | PRN
  Administered 2013-10-17: 500 [IU]
  Filled 2013-10-17: qty 5

## 2013-10-17 MED ORDER — PEGFILGRASTIM INJECTION 6 MG/0.6ML
6.0000 mg | Freq: Once | SUBCUTANEOUS | Status: AC
  Administered 2013-10-17: 6 mg via SUBCUTANEOUS
  Filled 2013-10-17: qty 0.6

## 2013-10-17 MED ORDER — SODIUM CHLORIDE 0.9 % IJ SOLN
10.0000 mL | INTRAMUSCULAR | Status: DC | PRN
  Administered 2013-10-17: 10 mL
  Filled 2013-10-17: qty 10

## 2013-10-17 NOTE — Patient Instructions (Signed)

## 2013-10-27 ENCOUNTER — Other Ambulatory Visit: Payer: Self-pay | Admitting: Oncology

## 2013-10-29 ENCOUNTER — Other Ambulatory Visit (HOSPITAL_BASED_OUTPATIENT_CLINIC_OR_DEPARTMENT_OTHER): Payer: PRIVATE HEALTH INSURANCE | Admitting: Lab

## 2013-10-29 ENCOUNTER — Telehealth: Payer: Self-pay | Admitting: Oncology

## 2013-10-29 ENCOUNTER — Ambulatory Visit (HOSPITAL_BASED_OUTPATIENT_CLINIC_OR_DEPARTMENT_OTHER): Payer: PRIVATE HEALTH INSURANCE | Admitting: Nurse Practitioner

## 2013-10-29 ENCOUNTER — Ambulatory Visit (HOSPITAL_BASED_OUTPATIENT_CLINIC_OR_DEPARTMENT_OTHER): Payer: PRIVATE HEALTH INSURANCE

## 2013-10-29 VITALS — BP 108/69 | HR 79 | Temp 97.0°F | Resp 18 | Ht 66.0 in | Wt 134.2 lb

## 2013-10-29 DIAGNOSIS — D6959 Other secondary thrombocytopenia: Secondary | ICD-10-CM

## 2013-10-29 DIAGNOSIS — C187 Malignant neoplasm of sigmoid colon: Secondary | ICD-10-CM

## 2013-10-29 DIAGNOSIS — G609 Hereditary and idiopathic neuropathy, unspecified: Secondary | ICD-10-CM

## 2013-10-29 DIAGNOSIS — Z5111 Encounter for antineoplastic chemotherapy: Secondary | ICD-10-CM

## 2013-10-29 DIAGNOSIS — C189 Malignant neoplasm of colon, unspecified: Secondary | ICD-10-CM

## 2013-10-29 DIAGNOSIS — D702 Other drug-induced agranulocytosis: Secondary | ICD-10-CM

## 2013-10-29 DIAGNOSIS — T451X5A Adverse effect of antineoplastic and immunosuppressive drugs, initial encounter: Secondary | ICD-10-CM

## 2013-10-29 LAB — CBC WITH DIFFERENTIAL/PLATELET
BASO%: 0.7 % (ref 0.0–2.0)
Basophils Absolute: 0.1 10*3/uL (ref 0.0–0.1)
EOS%: 1.9 % (ref 0.0–7.0)
Eosinophils Absolute: 0.1 10*3/uL (ref 0.0–0.5)
HGB: 12 g/dL (ref 11.6–15.9)
LYMPH%: 20.7 % (ref 14.0–49.7)
MCH: 32.3 pg (ref 25.1–34.0)
MCV: 99.5 fL (ref 79.5–101.0)
MONO%: 8.8 % (ref 0.0–14.0)
NEUT#: 5 10*3/uL (ref 1.5–6.5)
Platelets: 98 10*3/uL — ABNORMAL LOW (ref 145–400)
RBC: 3.71 10*6/uL (ref 3.70–5.45)
nRBC: 0 % (ref 0–0)

## 2013-10-29 MED ORDER — FLUOROURACIL CHEMO INJECTION 2.5 GM/50ML
400.0000 mg/m2 | Freq: Once | INTRAVENOUS | Status: AC
  Administered 2013-10-29: 650 mg via INTRAVENOUS
  Filled 2013-10-29: qty 13

## 2013-10-29 MED ORDER — LEUCOVORIN CALCIUM INJECTION 350 MG
400.0000 mg/m2 | Freq: Once | INTRAVENOUS | Status: AC
  Administered 2013-10-29: 632 mg via INTRAVENOUS
  Filled 2013-10-29: qty 31.6

## 2013-10-29 MED ORDER — SODIUM CHLORIDE 0.9 % IV SOLN
2400.0000 mg/m2 | INTRAVENOUS | Status: DC
  Administered 2013-10-29: 3800 mg via INTRAVENOUS
  Filled 2013-10-29: qty 76

## 2013-10-29 MED ORDER — DEXTROSE 5 % IV SOLN
Freq: Once | INTRAVENOUS | Status: AC
  Administered 2013-10-29: 11:00:00 via INTRAVENOUS

## 2013-10-29 NOTE — Progress Notes (Signed)
OFFICE PROGRESS NOTE  Interval history:  Ms. Carvey returns as scheduled. She completed cycle 11 FOLFOX (no oxaliplatin) 10/15/2013. She denies nausea/vomiting. No mouth sores. No diarrhea. She notes increased numbness in the hands and feet. She has a good appetite.   Objective: Blood pressure 108/69, pulse 79, temperature 97 F (36.1 C), temperature source Oral, resp. rate 18, height 5\' 6"  (1.676 m), weight 134 lb 3.2 oz (60.873 kg).  No thrush or ulcerations. Lungs are clear. Regular cardiac rhythm. Port-A-Cath site is without erythema. Abdomen is soft and nontender. No hepatomegaly. No leg edema. Calves soft and nontender. Moderate decrease in vibratory sense at the fingertips bilaterally per tuning fork exam.  Lab Results: Lab Results  Component Value Date   WBC 7.3 10/29/2013   HGB 12.0 10/29/2013   HCT 36.9 10/29/2013   MCV 99.5 10/29/2013   PLT 98* 10/29/2013    Chemistry:    Chemistry      Component Value Date/Time   NA 141 10/15/2013 0845   NA 140 05/23/2013 1105   K 3.5 10/15/2013 0845   K 3.8 05/23/2013 1105   CL 103 05/23/2013 1105   CO2 24 10/15/2013 0845   CO2 29 05/23/2013 1105   BUN 10.6 10/15/2013 0845   BUN 8 05/23/2013 1105   CREATININE 0.6 10/15/2013 0845   CREATININE 0.60 05/23/2013 1105      Component Value Date/Time   CALCIUM 9.2 10/15/2013 0845   CALCIUM 9.6 05/23/2013 1105   ALKPHOS 82 10/15/2013 0845   ALKPHOS 59 05/23/2013 1105   AST 25 10/15/2013 0845   AST 16 05/23/2013 1105   ALT 22 10/15/2013 0845   ALT 19 05/23/2013 1105   BILITOT 0.68 10/15/2013 0845   BILITOT 0.5 05/23/2013 1105       Studies/Results: No results found.  Medications: I have reviewed the patient's current medications.  Assessment/Plan:  1. 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 The tumor returned microsatellite stable with no loss of mismatch repair protein expression. Cycle 1 adjuvant FOLFOX  chemotherapy 05/28/2013.  Oxaliplatin was held beginning with cycle 8 due to neuropathy symptoms. 2. History of Microcytic anemia. 3. Port-A-Cath placement 05/24/2013. 4. History of uterine fibroids. 5. Nausea following cycle 1 FOLFOX. Aloxi and prophylactic Decadron were added with cycle 2. She had nausea following cycle 2. Emend was added with cycle 3. Improved. No nausea following cycle 5 or 6. 6. Mild neutropenia secondary chemotherapy. She received Neulasta beginning with cycle 4 FOLFOX  7. Abdominal discomfort following cycle 4 FOLFOX. Question gastritis related to steroids. She began Prilosec 20 mg daily. 8. Mild thrombocytopenia secondary chemotherapy. 9. Pruritus and erythema over the palms beginning day 4 following cycle 5 FOLFOX and lasting approximately 3 days. Question hand-foot syndrome related to 5-fluorouracil, question allergic reaction. She was instructed to contact the office if she experiences this again. 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 Increased.  Disposition-she appears stable. Plan to proceed with the 12th and final cycle of adjuvant therapy today as scheduled. Oxaliplatin will continue to be held due to progressive neuropathy symptoms. She will return for a followup visit and Port-A-Cath flush in 6 weeks. She will contact the office in the interim with any problems.  Plan reviewed with Dr. Truett Perna.  Lonna Cobb ANP/GNP-BC

## 2013-10-29 NOTE — Telephone Encounter (Signed)
Gave pt appt for Md and flush for [facility] 2015

## 2013-10-29 NOTE — Patient Instructions (Signed)
[facility] Discharge Instructions for Patients Receiving Chemotherapy  Today you received the following chemotherapy agents: Leucovorin, 5FU  To help prevent nausea and vomiting after your treatment, we encourage you to take your nausea medication as prescribed.    If you develop nausea and vomiting that is not controlled by your nausea medication, call the clinic.   BELOW ARE SYMPTOMS THAT SHOULD BE REPORTED IMMEDIATELY:  *FEVER GREATER THAN 100.5 F  *CHILLS WITH OR WITHOUT FEVER  NAUSEA AND VOMITING THAT IS NOT CONTROLLED WITH YOUR NAUSEA MEDICATION  *UNUSUAL SHORTNESS OF BREATH  *UNUSUAL BRUISING OR BLEEDING  TENDERNESS IN MOUTH AND THROAT WITH OR WITHOUT PRESENCE OF ULCERS  *URINARY PROBLEMS  *BOWEL PROBLEMS  UNUSUAL RASH Items with * indicate a potential emergency and should be followed up as soon as possible.  Feel free to call the clinic you have any questions or concerns. The clinic phone number is [phone].    

## 2013-10-31 ENCOUNTER — Ambulatory Visit (HOSPITAL_BASED_OUTPATIENT_CLINIC_OR_DEPARTMENT_OTHER): Payer: PRIVATE HEALTH INSURANCE

## 2013-10-31 VITALS — BP 117/72 | HR 82 | Temp 98.9°F

## 2013-10-31 DIAGNOSIS — C189 Malignant neoplasm of colon, unspecified: Secondary | ICD-10-CM

## 2013-10-31 DIAGNOSIS — Z452 Encounter for adjustment and management of vascular access device: Secondary | ICD-10-CM

## 2013-10-31 DIAGNOSIS — C187 Malignant neoplasm of sigmoid colon: Secondary | ICD-10-CM

## 2013-10-31 MED ORDER — HEPARIN SOD (PORK) LOCK FLUSH 100 UNIT/ML IV SOLN
500.0000 [IU] | Freq: Once | INTRAVENOUS | Status: AC | PRN
  Administered 2013-10-31: 500 [IU]
  Filled 2013-10-31: qty 5

## 2013-10-31 MED ORDER — SODIUM CHLORIDE 0.9 % IJ SOLN
10.0000 mL | INTRAMUSCULAR | Status: DC | PRN
  Administered 2013-10-31: 10 mL
  Filled 2013-10-31: qty 10

## 2013-11-12 ENCOUNTER — Telehealth: Payer: Self-pay | Admitting: *Deleted

## 2013-11-12 DIAGNOSIS — C189 Malignant neoplasm of colon, unspecified: Secondary | ICD-10-CM

## 2013-11-12 NOTE — Telephone Encounter (Signed)
Message copied by Gala Romney on Mon Nov 12, 2013  1:06 PM ------      Message from: Ladene Artist      Created: Tue Oct 30, 2013  8:06 PM       Repeat cea next visit ------

## 2013-12-10 ENCOUNTER — Other Ambulatory Visit: Payer: Self-pay | Admitting: *Deleted

## 2013-12-10 ENCOUNTER — Ambulatory Visit (HOSPITAL_BASED_OUTPATIENT_CLINIC_OR_DEPARTMENT_OTHER): Payer: PRIVATE HEALTH INSURANCE

## 2013-12-10 ENCOUNTER — Ambulatory Visit (HOSPITAL_BASED_OUTPATIENT_CLINIC_OR_DEPARTMENT_OTHER): Payer: PRIVATE HEALTH INSURANCE | Admitting: Oncology

## 2013-12-10 ENCOUNTER — Telehealth: Payer: Self-pay | Admitting: Oncology

## 2013-12-10 VITALS — BP 109/70 | HR 61 | Temp 98.0°F | Resp 18

## 2013-12-10 VITALS — BP 114/67 | HR 74 | Temp 99.0°F | Resp 17 | Ht 66.0 in | Wt 136.4 lb

## 2013-12-10 DIAGNOSIS — C189 Malignant neoplasm of colon, unspecified: Secondary | ICD-10-CM

## 2013-12-10 DIAGNOSIS — C187 Malignant neoplasm of sigmoid colon: Secondary | ICD-10-CM

## 2013-12-10 DIAGNOSIS — D702 Other drug-induced agranulocytosis: Secondary | ICD-10-CM

## 2013-12-10 DIAGNOSIS — IMO0002 Reserved for concepts with insufficient information to code with codable children: Secondary | ICD-10-CM

## 2013-12-10 DIAGNOSIS — G620 Drug-induced polyneuropathy: Secondary | ICD-10-CM

## 2013-12-10 LAB — CBC WITH DIFFERENTIAL/PLATELET
BASO%: 0.7 % (ref 0.0–2.0)
BASOS ABS: 0 10*3/uL (ref 0.0–0.1)
EOS%: 3 % (ref 0.0–7.0)
Eosinophils Absolute: 0.1 10*3/uL (ref 0.0–0.5)
HCT: 35.6 % (ref 34.8–46.6)
HGB: 11.9 g/dL (ref 11.6–15.9)
LYMPH#: 1.5 10*3/uL (ref 0.9–3.3)
LYMPH%: 30.3 % (ref 14.0–49.7)
MCH: 32.5 pg (ref 25.1–34.0)
MCHC: 33.4 g/dL (ref 31.5–36.0)
MCV: 97.4 fL (ref 79.5–101.0)
MONO#: 0.6 10*3/uL (ref 0.1–0.9)
MONO%: 12 % (ref 0.0–14.0)
NEUT#: 2.7 10*3/uL (ref 1.5–6.5)
NEUT%: 54 % (ref 38.4–76.8)
PLATELETS: 147 10*3/uL (ref 145–400)
RBC: 3.65 10*6/uL — ABNORMAL LOW (ref 3.70–5.45)
RDW: 13.4 % (ref 11.2–14.5)
WBC: 5 10*3/uL (ref 3.9–10.3)

## 2013-12-10 LAB — CEA

## 2013-12-10 MED ORDER — HEPARIN SOD (PORK) LOCK FLUSH 100 UNIT/ML IV SOLN
500.0000 [IU] | Freq: Once | INTRAVENOUS | Status: AC
  Administered 2013-12-10: 500 [IU]
  Filled 2013-12-10: qty 5

## 2013-12-10 MED ORDER — SODIUM CHLORIDE 0.9 % IJ SOLN
10.0000 mL | Freq: Once | INTRAMUSCULAR | Status: AC
  Administered 2013-12-10: 10 mL
  Filled 2013-12-10: qty 10

## 2013-12-10 MED ORDER — DULOXETINE HCL 60 MG PO CPEP: 60.0000 mg | ORAL_CAPSULE | Freq: Every day | ORAL | Status: DC

## 2013-12-10 NOTE — Progress Notes (Signed)
   [facility]    OFFICE PROGRESS NOTE   INTERVAL HISTORY:   She returns for scheduled followup of colon cancer. She completed a final cycle of chemotherapy on 10/29/2013. She complains of persistent numbness in the fingers and toes. This does not interfere with fine motor activity. However there is now associated pain that interferes with activity. The menstrual cycle has returned. She is concerned that she has gained weight.  Objective:  Vital signs in last 24 hours:  Blood pressure 114/67, pulse 74, temperature 99 F (37.2 C), temperature source Oral, resp. rate 17, height $RemoveBe'5\' 6"'[id number]$  (1.676 m), weight 136 lb 6.4 oz (61.871 kg), SpO2 97.00%.    HEENT: No thrush or ulcers Lymphatics: No cervical, supraclavicular, axillary, or inguinal nodes Resp: Lungs clear bilaterally Cardio: Regular rate and rhythm GI: No hepatomegaly, nontender, no mass Vascular: No leg edema Neuro: Mild decrease in vibratory sense at the fingertips bilaterally  Skin: Faint erythema at the fingertips, no skin breakdown   Portacath/PICC-without erythema  Lab Results: CEA on 10/29/2013-0.8    Medications: I have reviewed the patient's current medications.  Assessment/Plan: 1. 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 The tumor returned microsatellite stable with no loss of mismatch repair protein expression. Cycle 1 adjuvant FOLFOX chemotherapy 05/28/2013.  Oxaliplatin was held beginning with cycle 8 due to neuropathy symptoms. Final cycle of 5-fluorouracil/leucovorin given on 10/29/2013 2. History of Microcytic anemia. Improved. 3. Port-A-Cath placement 05/24/2013. 4. History of uterine fibroids. 5. Nausea following cycle 1 FOLFOX. Aloxi and prophylactic Decadron were added with cycle 2. She had nausea following cycle 2. Emend was added with cycle 3. Improved. No nausea following cycle 5 or 6. 6. Mild  neutropenia secondary chemotherapy. She received Neulasta beginning with cycle 4 FOLFOX  7. Abdominal discomfort following cycle 4 FOLFOX. Question gastritis related to steroids. She began Prilosec 20 mg daily. 8. History of Mild thrombocytopenia secondary chemotherapy. 9. Pruritus and erythema over the palms beginning day 4 following cycle 5 FOLFOX and lasting approximately 3 days. Question hand-foot syndrome related to 5-fluorouracil, question allergic reaction. She was instructed to contact the office if she experiences this again. 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 Mild improvement in the numbness, she now has pain  Disposition:  Ms. Brandhorst has completed adjuvant chemotherapy for treatment of stage III colon cancer. She has oxaliplatin neuropathy. The neuropathy is now painful. She will begin a trial of Cymbalta. She will contact us if the Cymbalta does not help.  We decided to leave the Port-A-Cath in place for now. The Port-A-Cath was flushed today. She will return for a Port-A-Cath flush every 6 weeks. She is scheduled for a restaging CT evaluation in May. We will see her a few days after the CT. She will be referred for removal of the Port-A-Cath if there is no evidence of recurrent colon cancer.   Betsy Coder, MD  12/10/2013  11:17 AM

## 2013-12-10 NOTE — Telephone Encounter (Signed)
gv and printed appt sched and avs for pt for Feb thru May...gv pt barium

## 2014-01-16 ENCOUNTER — Encounter: Payer: Self-pay | Admitting: Internal Medicine

## 2014-01-16 ENCOUNTER — Other Ambulatory Visit (INDEPENDENT_AMBULATORY_CARE_PROVIDER_SITE_OTHER): Payer: PRIVATE HEALTH INSURANCE

## 2014-01-16 ENCOUNTER — Ambulatory Visit (INDEPENDENT_AMBULATORY_CARE_PROVIDER_SITE_OTHER): Payer: PRIVATE HEALTH INSURANCE | Admitting: Internal Medicine

## 2014-01-16 VITALS — BP 118/80 | HR 76 | Temp 99.3°F | Resp 16 | Wt 138.0 lb

## 2014-01-16 DIAGNOSIS — C187 Malignant neoplasm of sigmoid colon: Secondary | ICD-10-CM

## 2014-01-16 DIAGNOSIS — IMO0002 Reserved for concepts with insufficient information to code with codable children: Secondary | ICD-10-CM

## 2014-01-16 DIAGNOSIS — G622 Polyneuropathy due to other toxic agents: Secondary | ICD-10-CM

## 2014-01-16 LAB — URINALYSIS, ROUTINE W REFLEX MICROSCOPIC
Bilirubin Urine: NEGATIVE
Ketones, ur: NEGATIVE
Leukocytes, UA: NEGATIVE
Nitrite: NEGATIVE
Specific Gravity, Urine: 1.01 (ref 1.000–1.030)
Total Protein, Urine: NEGATIVE
Urine Glucose: NEGATIVE
Urobilinogen, UA: 0.2 (ref 0.0–1.0)
WBC, UA: NONE SEEN (ref 0–?)
pH: 8 (ref 5.0–8.0)

## 2014-01-16 LAB — BASIC METABOLIC PANEL
BUN: 9 mg/dL (ref 6–23)
CO2: 30 mEq/L (ref 19–32)
Calcium: 9.2 mg/dL (ref 8.4–10.5)
Chloride: 105 mEq/L (ref 96–112)
Creatinine, Ser: 0.6 mg/dL (ref 0.4–1.2)
GFR: 108.76 mL/min (ref >60.00)
Glucose, Bld: 89 mg/dL (ref 70–99)
Potassium: 3.7 mEq/L (ref 3.5–5.1)
Sodium: 139 mEq/L (ref 135–145)

## 2014-01-16 LAB — CBC WITH DIFFERENTIAL/PLATELET
Basophils Absolute: 0 10*3/uL (ref 0.0–0.1)
Basophils Relative: 0.4 % (ref 0.0–3.0)
Eosinophils Absolute: 0 10*3/uL (ref 0.0–0.7)
Eosinophils Relative: 0.3 % (ref 0.0–5.0)
HCT: 36.7 % (ref 36.0–46.0)
Hemoglobin: 12.2 g/dL (ref 12.0–15.0)
Lymphocytes Relative: 20.7 % (ref 12.0–46.0)
Lymphs Abs: 1 10*3/uL (ref 0.7–4.0)
MCHC: 33.1 g/dL (ref 30.0–36.0)
MCV: 95.2 fl (ref 78.0–100.0)
Monocytes Absolute: 0.6 10*3/uL (ref 0.1–1.0)
Monocytes Relative: 12.5 % — ABNORMAL HIGH (ref 3.0–12.0)
Neutro Abs: 3.2 10*3/uL (ref 1.4–7.7)
Neutrophils Relative %: 66.1 % (ref 43.0–77.0)
Platelets: 171 10*3/uL (ref 150.0–400.0)
RBC: 3.86 Mil/uL — ABNORMAL LOW (ref 3.87–5.11)
RDW: 12.6 % (ref 11.5–14.6)
WBC: 4.8 10*3/uL (ref 4.5–10.5)

## 2014-01-16 LAB — HEPATIC FUNCTION PANEL
ALT: 29 U/L (ref 0–35)
AST: 29 U/L (ref 0–37)
Albumin: 3.9 g/dL (ref 3.5–5.2)
Alkaline Phosphatase: 59 U/L (ref 39–117)
Bilirubin, Direct: 0.1 mg/dL (ref 0.0–0.3)
TOTAL PROTEIN: 7.1 g/dL (ref 6.0–8.3)
Total Bilirubin: 0.8 mg/dL (ref 0.3–1.2)

## 2014-01-16 LAB — IBC PANEL
Iron: 56 ug/dL (ref 42–145)
Saturation Ratios: 18.6 % — ABNORMAL LOW (ref 20.0–50.0)
Transferrin: 215.1 mg/dL (ref 212.0–360.0)

## 2014-01-16 LAB — VITAMIN B12: Vitamin B-12: 786 pg/mL (ref 211–911)

## 2014-01-16 LAB — TSH: TSH: 1.24 u[IU]/mL (ref 0.35–5.50)

## 2014-01-16 MED ORDER — AZITHROMYCIN 250 MG PO TABS: ORAL_TABLET | ORAL | Status: DC

## 2014-01-16 MED ORDER — METANX 3-90.314-2-35 MG PO CAPS: ORAL_CAPSULE | ORAL | Status: DC

## 2014-01-16 NOTE — Patient Instructions (Signed)
Gluten free trial (no wheat products) for 4-6 weeks. OK to use gluten-free bread and gluten-free pasta.  Milk free trial (no milk, ice cream, cheese and yogurt) for 4-6 weeks. OK to use almond, coconut, rice or soy milk. "Almond breeze" brand tastes good.  

## 2014-01-16 NOTE — Assessment & Plan Note (Addendum)
Will try Metanx

## 2014-01-16 NOTE — Progress Notes (Signed)
   Subjective:    HPI   C/o URI and cough, ST x 2-3 d. C/o neuropathy post-chemo (finished in Dec 2014)  Review of Systems  Constitutional: Negative for fever, chills, diaphoresis, activity change, appetite change, fatigue and unexpected weight change.  HENT: Negative for congestion, dental problem, ear pain, hearing loss, mouth sores, postnasal drip, sinus pressure, sneezing, sore throat and voice change.   Eyes: Negative for pain and visual disturbance.  Respiratory: Negative for cough, chest tightness, wheezing and stridor.   Cardiovascular: Negative for chest pain, palpitations and leg swelling.  Gastrointestinal: Positive for blood in stool. Negative for nausea, vomiting, abdominal pain, abdominal distention and rectal pain.  Genitourinary: Negative for dysuria, hematuria, decreased urine volume, vaginal bleeding, vaginal discharge, difficulty urinating, vaginal pain and menstrual problem.  Musculoskeletal: Negative for back pain, gait problem, joint swelling and neck pain.  Skin: Negative for color change, rash and wound.  Neurological: Negative for dizziness, tremors, syncope, speech difficulty, weakness and light-headedness.  Hematological: Negative for adenopathy.  Psychiatric/Behavioral: Negative for suicidal ideas, hallucinations, behavioral problems, confusion, sleep disturbance, dysphoric mood and decreased concentration. The patient is not hyperactive.    BP 118/80  Pulse 76  Temp(Src) 99.3 F (37.4 C) (Oral)  Resp 16  Wt 138 lb (62.596 kg)     Objective:   Physical Exam  Constitutional: She appears well-developed. No distress.  HENT:  Head: Normocephalic.  Right Ear: External ear normal.  Left Ear: External ear normal.  Nose: Nose normal.  Mouth/Throat: Oropharynx is clear and moist.  Eyes: Conjunctivae are normal. Pupils are equal, round, and reactive to light. Right eye exhibits no discharge. Left eye exhibits no discharge.  Neck: Normal range of motion.  Neck supple. No JVD present. No tracheal deviation present. No thyromegaly present.  Cardiovascular: Normal rate, regular rhythm and normal heart sounds.   Pulmonary/Chest: No stridor. No respiratory distress. She has no wheezes.  Abdominal: Soft. Bowel sounds are normal. She exhibits no distension and no mass. There is no tenderness. There is no rebound and no guarding.  Musculoskeletal: She exhibits no edema and no tenderness.  Lymphadenopathy:    She has no cervical adenopathy.  Neurological: She displays normal reflexes. No cranial nerve deficit. She exhibits normal muscle tone. Coordination normal.  Skin: No rash noted. No erythema.  Psychiatric: She has a normal mood and affect. Her behavior is normal. Judgment and thought content normal.   Lab Results  Component Value Date   WBC 5.0 12/10/2013   HGB 11.9 12/10/2013   HCT 35.6 12/10/2013   PLT 147 12/10/2013   GLUCOSE 102 10/15/2013   CHOL 186 11/14/2012   TRIG 71.0 11/14/2012   HDL 47.70 11/14/2012   LDLCALC 124* 11/14/2012   ALT 22 10/15/2013   AST 25 10/15/2013   NA 141 10/15/2013   K 3.5 10/15/2013   CL 103 05/23/2013   CREATININE 0.6 10/15/2013   BUN 10.6 10/15/2013   CO2 24 10/15/2013   TSH 2.60 11/14/2012   INR 0.94 05/23/2013          Assessment & Plan:

## 2014-01-16 NOTE — Assessment & Plan Note (Signed)
finished Chemo in Dec 2014

## 2014-01-16 NOTE — Progress Notes (Signed)
Pre visit review using our clinic review tool, if applicable. No additional management support is needed unless otherwise documented below in the visit note. 

## 2014-01-17 LAB — VITAMIN D 25 HYDROXY (VIT D DEFICIENCY, FRACTURES): Vit D, 25-Hydroxy: 40 ng/mL (ref 30–89)

## 2014-01-21 ENCOUNTER — Ambulatory Visit (HOSPITAL_BASED_OUTPATIENT_CLINIC_OR_DEPARTMENT_OTHER): Payer: PRIVATE HEALTH INSURANCE

## 2014-01-21 VITALS — BP 106/68 | HR 87 | Temp 98.0°F | Resp 17

## 2014-01-21 DIAGNOSIS — C187 Malignant neoplasm of sigmoid colon: Secondary | ICD-10-CM

## 2014-01-21 DIAGNOSIS — Z452 Encounter for adjustment and management of vascular access device: Secondary | ICD-10-CM

## 2014-01-21 DIAGNOSIS — Z95828 Presence of other vascular implants and grafts: Secondary | ICD-10-CM

## 2014-01-21 MED ORDER — HEPARIN SOD (PORK) LOCK FLUSH 100 UNIT/ML IV SOLN
500.0000 [IU] | Freq: Once | INTRAVENOUS | Status: AC
  Administered 2014-01-21: 500 [IU] via INTRAVENOUS
  Filled 2014-01-21: qty 5

## 2014-01-21 MED ORDER — SODIUM CHLORIDE 0.9 % IJ SOLN
10.0000 mL | INTRAMUSCULAR | Status: DC | PRN
  Administered 2014-01-21: 10 mL via INTRAVENOUS
  Filled 2014-01-21: qty 10

## 2014-01-21 NOTE — Patient Instructions (Signed)

## 2014-02-07 ENCOUNTER — Encounter: Payer: Self-pay | Admitting: Internal Medicine

## 2014-02-11 ENCOUNTER — Encounter: Payer: Self-pay | Admitting: Internal Medicine

## 2014-02-12 ENCOUNTER — Encounter: Payer: Self-pay | Admitting: Internal Medicine

## 2014-03-04 ENCOUNTER — Ambulatory Visit: Payer: PRIVATE HEALTH INSURANCE

## 2014-03-04 ENCOUNTER — Other Ambulatory Visit (HOSPITAL_BASED_OUTPATIENT_CLINIC_OR_DEPARTMENT_OTHER): Payer: PRIVATE HEALTH INSURANCE

## 2014-03-04 VITALS — BP 99/61 | HR 83 | Temp 98.5°F

## 2014-03-04 DIAGNOSIS — C187 Malignant neoplasm of sigmoid colon: Secondary | ICD-10-CM

## 2014-03-04 DIAGNOSIS — Z95828 Presence of other vascular implants and grafts: Secondary | ICD-10-CM

## 2014-03-04 DIAGNOSIS — C189 Malignant neoplasm of colon, unspecified: Secondary | ICD-10-CM

## 2014-03-04 LAB — CEA: CEA: <0.5 ng/mL (ref 0.0–5.0)

## 2014-03-04 LAB — BASIC METABOLIC PANEL (CC13)
Anion Gap: 8 mEq/L (ref 3–11)
BUN: 9.1 mg/dL (ref 7.0–26.0)
CALCIUM: 9 mg/dL (ref 8.4–10.4)
CO2: 27 meq/L (ref 22–29)
Chloride: 109 mEq/L (ref 98–109)
Creatinine: 0.7 mg/dL (ref 0.6–1.1)
GLUCOSE: 99 mg/dL (ref 70–140)
POTASSIUM: 3.4 meq/L — AB (ref 3.5–5.1)
Sodium: 144 mEq/L (ref 136–145)

## 2014-03-04 MED ORDER — HEPARIN SOD (PORK) LOCK FLUSH 100 UNIT/ML IV SOLN
500.0000 [IU] | Freq: Once | INTRAVENOUS | Status: AC
  Administered 2014-03-04: 500 [IU] via INTRAVENOUS
  Filled 2014-03-04: qty 5

## 2014-03-04 MED ORDER — SODIUM CHLORIDE 0.9 % IJ SOLN
10.0000 mL | INTRAMUSCULAR | Status: DC | PRN
  Administered 2014-03-04: 10 mL via INTRAVENOUS
  Filled 2014-03-04: qty 10

## 2014-03-04 NOTE — Patient Instructions (Signed)

## 2014-03-05 ENCOUNTER — Telehealth: Payer: Self-pay | Admitting: *Deleted

## 2014-03-05 NOTE — Telephone Encounter (Signed)
Message copied by Vivian Okelley P on Tue Mar 05, 2014 11:26 AM ------      Message from: Betsy Coder B      Created: Mon Mar 04, 2014  6:41 PM       Please call patient, cea is normal, K+ mildly low, ?diarrhea, f/u as scheduled ------

## 2014-03-05 NOTE — Telephone Encounter (Signed)
Per Dr. Benay Spice; notified pt cea is normal, potassium is mildly low.  Pt reports she is not having and diarrhea at this time.  Pt instructed to start anti-diarrheal med if needed for diarrhea and f/u as scheduled.  Pt verbalized understanding and confirmed appt for 04/05/14 with Ned Card, NP

## 2014-03-20 ENCOUNTER — Ambulatory Visit (AMBULATORY_SURGERY_CENTER): Payer: Self-pay | Admitting: *Deleted

## 2014-03-20 VITALS — Ht 66.0 in | Wt 132.8 lb

## 2014-03-20 DIAGNOSIS — Z85048 Personal history of other malignant neoplasm of rectum, rectosigmoid junction, and anus: Secondary | ICD-10-CM

## 2014-03-20 MED ORDER — MOVIPREP 100 G PO SOLR: ORAL | Status: DC

## 2014-03-20 NOTE — Progress Notes (Signed)
Patient denies any allergies to eggs or soy. Patient denies any problems with anesthesia. No oxygen use at home, no diet/wt loss pills. EMMI educations given to patient on colonoscopy.

## 2014-03-27 ENCOUNTER — Ambulatory Visit (INDEPENDENT_AMBULATORY_CARE_PROVIDER_SITE_OTHER): Payer: PRIVATE HEALTH INSURANCE | Admitting: General Surgery

## 2014-03-27 ENCOUNTER — Encounter (INDEPENDENT_AMBULATORY_CARE_PROVIDER_SITE_OTHER): Payer: Self-pay | Admitting: General Surgery

## 2014-03-27 VITALS — BP 118/78 | HR 72 | Temp 97.4°F | Resp 12 | Wt 132.6 lb

## 2014-03-27 DIAGNOSIS — C189 Malignant neoplasm of colon, unspecified: Secondary | ICD-10-CM

## 2014-03-27 NOTE — Progress Notes (Signed)
Subjective:     Patient ID: Priscilla Hill, female   DOB: 02-Sep-1966, 48 y.o.   MRN: [id number]  HPI   She is status post sigmoid colectomy for stage III colon cancer. A Port-A-Cath was placed for chemotherapy. It has been 11 months since her partial colectomy. She has completed chemotherapy. She is due to get a CT scan and colonoscopy later this month. She is interested in having the Port-A-Cath removed.   Review of Systems  Energy level is normal. She is working. No problems with bowel habits.     Objective:   Physical Exam Gen.-she looks good and is in no acute distress.  Lymph nodes-no palpable supraclavicular or periumbilical adenopathy.  Abdomen-soft, nontender, no masses or organomegaly, surgical scars are solid.    Assessment:     Stage III sigmoid colon cancer status post partial colectomy and chemotherapy. No clinical evidence of recurrence. She is due to get CT scan and colonoscopy later this month.     Plan:     As long as the CT scan and colonoscopy did not show evidence for need for further treatment, will schedule her for Port-A-Cath removal after these are done.  I have discussed the procedure, risks, and aftercare Port-A-Cath removal. Risks include but are not limited to bleeding, infection, and wound healing problems. I have asked her to call us when she is done with her colonoscopy.

## 2014-03-27 NOTE — Patient Instructions (Signed)
Please call after the CT scan and colonoscopy are done. Then we will talk about scheduling the Port-A-Cath removal.

## 2014-04-02 ENCOUNTER — Encounter (HOSPITAL_COMMUNITY): Payer: Self-pay

## 2014-04-02 ENCOUNTER — Ambulatory Visit (HOSPITAL_COMMUNITY)
Admission: RE | Admit: 2014-04-02 | Discharge: 2014-04-02 | Disposition: A | Discharge status: Home / Self Care | Payer: PRIVATE HEALTH INSURANCE | Source: Ambulatory Visit | Attending: Oncology | Admitting: Oncology

## 2014-04-02 DIAGNOSIS — C189 Malignant neoplasm of colon, unspecified: Secondary | ICD-10-CM | POA: Insufficient documentation

## 2014-04-02 MED ORDER — IOHEXOL 300 MG/ML  SOLN
80.0000 mL | Freq: Once | INTRAMUSCULAR | Status: AC | PRN
  Administered 2014-04-02: 80 mL via INTRAVENOUS

## 2014-04-03 ENCOUNTER — Encounter: Payer: PRIVATE HEALTH INSURANCE | Admitting: Internal Medicine

## 2014-04-04 ENCOUNTER — Encounter: Payer: Self-pay | Admitting: Internal Medicine

## 2014-04-05 ENCOUNTER — Ambulatory Visit (HOSPITAL_BASED_OUTPATIENT_CLINIC_OR_DEPARTMENT_OTHER): Payer: PRIVATE HEALTH INSURANCE

## 2014-04-05 ENCOUNTER — Ambulatory Visit (HOSPITAL_BASED_OUTPATIENT_CLINIC_OR_DEPARTMENT_OTHER): Payer: PRIVATE HEALTH INSURANCE | Admitting: Nurse Practitioner

## 2014-04-05 ENCOUNTER — Telehealth: Payer: Self-pay | Admitting: Oncology

## 2014-04-05 VITALS — BP 112/69 | HR 89 | Temp 98.9°F | Resp 17 | Ht 66.0 in | Wt 133.1 lb

## 2014-04-05 DIAGNOSIS — G620 Drug-induced polyneuropathy: Secondary | ICD-10-CM

## 2014-04-05 DIAGNOSIS — C187 Malignant neoplasm of sigmoid colon: Secondary | ICD-10-CM

## 2014-04-05 DIAGNOSIS — D702 Other drug-induced agranulocytosis: Secondary | ICD-10-CM

## 2014-04-05 LAB — BASIC METABOLIC PANEL (CC13)
ANION GAP: 10 meq/L (ref 3–11)
BUN: 10.1 mg/dL (ref 7.0–26.0)
CHLORIDE: 108 meq/L (ref 98–109)
CO2: 24 mEq/L (ref 22–29)
Calcium: 9.8 mg/dL (ref 8.4–10.4)
Creatinine: 0.7 mg/dL (ref 0.6–1.1)
GLUCOSE: 102 mg/dL (ref 70–140)
POTASSIUM: 4 meq/L (ref 3.5–5.1)
SODIUM: 141 meq/L (ref 136–145)

## 2014-04-05 NOTE — Telephone Encounter (Signed)
gv and printed appt sched and avs for pt for May, July and Nov...pt did not want to sched any flushes after July due to taking out port nxt mth

## 2014-04-05 NOTE — Progress Notes (Signed)
  Priscilla Hill   Diagnosis: Colon cancer.   INTERVAL HISTORY:   Priscilla Hill returns as scheduled. Overall she is feeling well. She notes decreased numbness in the feet. The numbness in the hands has resolved. She has pain in her feet with prolonged walking. Following her last visit here she was prescribed Cymbalta. She discontinued the Cymbalta due to side effects. She denies nausea/vomiting. No mouth sores. No diarrhea. She occasionally notes a small amount of blood on the toilet tissue after a bowel movement. She thinks this is due to hemorrhoids. She is scheduled for a colonoscopy later this month. She has a good appetite. She denies pain.  Objective:  Vital signs in last 24 hours:  Blood pressure 112/69, pulse 89, temperature 98.9 F (37.2 C), temperature source Oral, resp. rate 17, height $RemoveBe'5\' 6"'jhfIuorTh$  (1.676 m), weight 133 lb 1.6 oz (60.374 kg), last menstrual period 02/25/2014.    HEENT: No thrush or ulcerations. Lymphatics: No palpable cervical, supraclavicular, axillary or inguinal lymph nodes. Resp: Lungs clear. Cardio: Regular cardiac rhythm. GI: Abdomen soft and nontender. No hepatomegaly. Vascular: No leg edema. Neuro: Vibratory sense intact over the fingertips per tuning fork exam.  Port-A-Cath site is without erythema.     Lab Results:  Lab Results  Component Value Date   WBC 4.8 01/16/2014   HGB 12.2 01/16/2014   HCT 36.7 01/16/2014   MCV 95.2 01/16/2014   PLT 171.0 01/16/2014   NEUTROABS 3.2 01/16/2014    Imaging:  No results found.  Medications: I have reviewed the patient's current medications.  Assessment/Plan: 1. 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 The tumor returned microsatellite stable with no loss of mismatch repair protein expression. Cycle 1 adjuvant FOLFOX chemotherapy 05/28/2013.  Oxaliplatin was held beginning with cycle 8 due to  neuropathy symptoms.  Final cycle of 5-fluorouracil/leucovorin given on 10/29/2013. 03/04/2014 CEA less than 0.5. 04/02/2014 CT chest/abdomen/pelvis negative for metastatic disease. 2. History of microcytic anemia.  3. Port-A-Cath placement 05/24/2013. 4. History of uterine fibroids. 5. Nausea following cycle 1 FOLFOX. Aloxi and prophylactic Decadron were added with cycle 2. She had nausea following cycle 2. Emend was added with cycle 3. Improved. No nausea following cycle 5 or 6. 6. Mild neutropenia secondary chemotherapy. She received Neulasta beginning with cycle 4 FOLFOX  7. Abdominal discomfort following cycle 4 FOLFOX. Question gastritis related to steroids. She began Prilosec 20 mg daily. 8. History of mild thrombocytopenia secondary chemotherapy. 9. Pruritus and erythema over the palms beginning day 4 following cycle 5 FOLFOX and lasting approximately 3 days. Question hand-foot syndrome related to 5-fluorouracil, question allergic reaction. She was instructed to contact the office if she experiences this again. 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 The numbness has resolved in the hands and improved in the feet. She has pain in the feet with prolonged walking. She discontinued Cymbalta due to poor tolerance.   Disposition: She appears well. She remains in remission from colon cancer.   She is scheduled for a colonoscopy later this month. She plans to contact Dr. Zella Richer for Port-A-Cath removal once the colonoscopy has been completed.  She will return for a followup visit and CEA in 6 months. She will contact the office in the interim with any problems.  Plan reviewed with Dr. Benay Spice.    Owens Shark ANP/GNP-BC   04/05/2014  10:26 AM

## 2014-04-08 ENCOUNTER — Other Ambulatory Visit: Payer: Self-pay

## 2014-04-08 ENCOUNTER — Telehealth: Payer: Self-pay

## 2014-04-08 NOTE — Telephone Encounter (Signed)
Message copied by Bevelyn Ngo on Mon Apr 08, 2014  9:13 AM ------      Message from: Burton, [place] K      Created: Fri Apr 05, 2014  5:11 PM       Please let her know potassium was in normal range. Thanks.      ----- Message -----         From: [facility]         Sent: 04/05/2014  11:33 AM           To: Owens Shark, NP                   ------

## 2014-04-08 NOTE — Telephone Encounter (Signed)
Called to inform patient of lab results. Patient not available at this time. Left message informing patient to call back.

## 2014-04-09 ENCOUNTER — Telehealth: Payer: Self-pay

## 2014-04-09 NOTE — Telephone Encounter (Signed)
Called and informed patient potassium was in normal range at 4.0. Patient denied any questions or concerns at this time. Informed patient to call back with any questions or concerns.

## 2014-04-09 NOTE — Telephone Encounter (Signed)
Message copied by Bevelyn Ngo on Tue Apr 09, 2014  9:18 AM ------      Message from: Faith, [place] K      Created: Fri Apr 05, 2014  5:11 PM       Please let her know potassium was in normal range. Thanks.      ----- Message -----         From: [facility]         Sent: 04/05/2014  11:33 AM           To: Owens Shark, NP                   ------

## 2014-04-12 ENCOUNTER — Ambulatory Visit (HOSPITAL_BASED_OUTPATIENT_CLINIC_OR_DEPARTMENT_OTHER): Payer: PRIVATE HEALTH INSURANCE

## 2014-04-12 VITALS — BP 101/63 | HR 80 | Temp 98.1°F

## 2014-04-12 DIAGNOSIS — Z95828 Presence of other vascular implants and grafts: Secondary | ICD-10-CM

## 2014-04-12 DIAGNOSIS — Z452 Encounter for adjustment and management of vascular access device: Secondary | ICD-10-CM

## 2014-04-12 DIAGNOSIS — C187 Malignant neoplasm of sigmoid colon: Secondary | ICD-10-CM

## 2014-04-12 MED ORDER — SODIUM CHLORIDE 0.9 % IJ SOLN
10.0000 mL | INTRAMUSCULAR | Status: DC | PRN
  Administered 2014-04-12: 10 mL via INTRAVENOUS
  Filled 2014-04-12: qty 10

## 2014-04-12 MED ORDER — HEPARIN SOD (PORK) LOCK FLUSH 100 UNIT/ML IV SOLN
500.0000 [IU] | Freq: Once | INTRAVENOUS | Status: AC
Start: 2014-04-12 — End: 2014-04-12
  Administered 2014-04-12: 500 [IU] via INTRAVENOUS
  Filled 2014-04-12: qty 5

## 2014-04-17 ENCOUNTER — Encounter: Payer: Self-pay | Admitting: Internal Medicine

## 2014-04-17 ENCOUNTER — Ambulatory Visit (AMBULATORY_SURGERY_CENTER): Payer: PRIVATE HEALTH INSURANCE | Admitting: Internal Medicine

## 2014-04-17 VITALS — BP 100/72 | HR 60 | Temp 98.9°F | Resp 21 | Ht 66.0 in | Wt 132.0 lb

## 2014-04-17 DIAGNOSIS — D126 Benign neoplasm of colon, unspecified: Secondary | ICD-10-CM

## 2014-04-17 DIAGNOSIS — Z85048 Personal history of other malignant neoplasm of rectum, rectosigmoid junction, and anus: Secondary | ICD-10-CM

## 2014-04-17 MED ORDER — SODIUM CHLORIDE 0.9 % IV SOLN: 500.0000 mL | INTRAVENOUS | Status: DC

## 2014-04-17 NOTE — Patient Instructions (Signed)
YOU HAD AN ENDOSCOPIC PROCEDURE TODAY AT THE [facility]: Refer to the procedure report that was given to you for any specific questions about what was found during the examination.  If the procedure report does not answer your questions, please call your gastroenterologist to clarify.  If you requested that your care partner not be given the details of your procedure findings, then the procedure report has been included in a sealed envelope for you to review at your convenience later.  YOU SHOULD EXPECT: Some feelings of bloating in the abdomen. Passage of more gas than usual.  Walking can help get rid of the air that was put into your GI tract during the procedure and reduce the bloating. If you had a lower endoscopy (such as a colonoscopy or flexible sigmoidoscopy) you may notice spotting of blood in your stool or on the toilet paper. If you underwent a bowel prep for your procedure, then you may not have a normal bowel movement for a few days.  DIET: Your first meal following the procedure should be a light meal and then it is ok to progress to your normal diet.  A half-sandwich or bowl of soup is an example of a good first meal.  Heavy or fried foods are harder to digest and may make you feel nauseous or bloated.  Likewise meals heavy in dairy and vegetables can cause extra gas to form and this can also increase the bloating.  Drink plenty of fluids but you should avoid alcoholic beverages for 24 hours.  ACTIVITY: Your care partner should take you home directly after the procedure.  You should plan to take it easy, moving slowly for the rest of the day.  You can resume normal activity the day after the procedure however you should NOT DRIVE or use heavy machinery for 24 hours (because of the sedation medicines used during the test).    SYMPTOMS TO REPORT IMMEDIATELY: A gastroenterologist can be reached at any hour.  During normal business hours, 8:30 AM to 5:00 PM Monday through Friday,  call [phone].  After hours and on weekends, please call the GI answering service at [phone] who will take a message and have the physician on call contact you.   Following lower endoscopy (colonoscopy or flexible sigmoidoscopy):  Excessive amounts of blood in the stool  Significant tenderness or worsening of abdominal pains  Swelling of the abdomen that is new, acute  Fever of 100F or higher   FOLLOW UP: If any biopsies were taken you will be contacted by phone or by letter within the next 1-3 weeks.  Call your gastroenterologist if you have not heard about the biopsies in 3 weeks.  Our staff will call the home number listed on your records the next business day following your procedure to check on you and address any questions or concerns that you may have at that time regarding the information given to you following your procedure. This is a courtesy call and so if there is no answer at the home number and we have not heard from you through the emergency physician on call, we will assume that you have returned to your regular daily activities without incident.  SIGNATURES/CONFIDENTIALITY: You and/or your care partner have signed paperwork which will be entered into your electronic medical record.  These signatures attest to the fact that that the information above on your After Visit Summary has been reviewed and is understood.  Full responsibility of the confidentiality of   this discharge information lies with you and/or your care-partner.    Await biopsies results. You may resume your current medications today. Please call if any questions or concerns. Handout was given to your care partner on a high fiber diet.

## 2014-04-17 NOTE — Op Note (Signed)
[place]  [address] [place], [address]   COLONOSCOPY PROCEDURE REPORT  PATIENT: Priscilla Hill, Priscilla Hill  MR#: [id number] BIRTHDATE: 1966-01-18 , 58  yrs. old GENDER: Female ENDOSCOPIST: Lafayette Dragon, MD REFERRED ME:QAST Avel Sensor, M.D., Dr Benay Spice, Dr Zella Richer PROCEDURE DATE:  04/17/2014 PROCEDURE:   Colonoscopy with biopsy First Screening Colonoscopy - Avg.  risk and is 50 yrs.  old or older - No.  Prior Negative Screening - Now for repeat screening. N/A  History of Adenoma - Now for follow-up colonoscopy & has been > or = to 3 yrs.  N/A  Polyps Removed Today? No.  Recommend repeat exam, <10 yrs? Yes.  High risk (family or personal hx). 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 MEDICATIONS: MAC sedation, administered by CRNA and propofol (Diprivan) 200mg  IV  DESCRIPTION OF PROCEDURE:   After the risks benefits and alternatives of the procedure were thoroughly explained, informed consent was obtained.  A digital rectal exam revealed no abnormalities of the rectum.   The LB [id number]  endoscope was introduced through the anus and advanced to the cecum, which was identified by both the appendix and ileocecal valve. No adverse events experienced.   The quality of the prep was excellent, using MoviPrep  The instrument was then slowly withdrawn as the colon was fully examined.      COLON FINDINGS: There was evidence of a prior end-to-end colo-colonic surgical anastomosis in the sigmoid colon.at the level of 15 cm from the rectum. It was widely patent  Multiple biopsies were performed.there was no evidence of recurrent cancer. Descending colon splenic flexure transverse colon hepatic flexure and ascending colon were normal including cecum and ileocecal valve Retroflexed views revealed no abnormalities. The  time to cecum=3 minutes 34 seconds.  Withdrawal time=7 minutes 32 seconds.  The scope was withdrawn and the procedure completed. COMPLICATIONS: There were no complications.  ENDOSCOPIC IMPRESSION: status post sigmoid carcinoma resection in May 2014 with no evidence of recurrence There was evidence of a prior colo-colonic surgical anastomosis in the sigmoid colon; multiple biopsies were performed  RECOMMENDATIONS: 1.  Await biopsy results 2.  high fiber diet Recall colonoscopy in 2 years   eSigned:  Lafayette Dragon, MD 04/17/2014 11:30 AM   cc:   PATIENT NAME:  Luana, Tatro MR#: [id number]

## 2014-04-17 NOTE — Progress Notes (Signed)
A/ox3, pleased with MAC, report to RN 

## 2014-04-17 NOTE — Progress Notes (Signed)
No problems noted in the recovery room. maw 

## 2014-04-17 NOTE — Progress Notes (Signed)
Called to room to assist during endoscopic procedure.  Patient ID and intended procedure confirmed with present staff. Received instructions for my participation in the procedure from the performing physician.  

## 2014-04-18 ENCOUNTER — Telehealth: Payer: Self-pay | Admitting: *Deleted

## 2014-04-18 NOTE — Telephone Encounter (Signed)
  Follow up Call-  Call back number 04/17/2014 03/29/2013  Post procedure Call Back phone  # [phone]  Permission to leave phone message Yes Yes     Patient questions:  Do you have a fever, pain , or abdominal swelling? no Pain Score  0 *  Have you tolerated food without any problems? no  Have you been able to return to your normal activities? yes  Do you have any questions about your discharge instructions: Diet   no Medications  no Follow up visit  no  Do you have questions or concerns about your Care? no  Actions: * If pain score is 4 or above: No action needed, pain <4.   Pt. States that she was not able to keep food down yesterday.  Denies any abdominal pain or discomfort.  She denies fever.  States she feels better this AM.  I advised her to call back later today if she Isn't able to keep food or liquids down, develops abdominal pain or tenderness, or becomes febrile.

## 2014-04-22 ENCOUNTER — Encounter: Payer: Self-pay | Admitting: Internal Medicine

## 2014-04-22 ENCOUNTER — Telehealth (INDEPENDENT_AMBULATORY_CARE_PROVIDER_SITE_OTHER): Payer: Self-pay

## 2014-04-22 NOTE — Telephone Encounter (Signed)
Pt calling stating she would like to be scheduled for PAC removal. I advised pt that this request will be sent to Dr Zella Richer and his assistant to review and contact pt. Pt can be reached at [phone].

## 2014-04-23 ENCOUNTER — Encounter: Payer: Self-pay | Admitting: *Deleted

## 2014-04-24 ENCOUNTER — Encounter (INDEPENDENT_AMBULATORY_CARE_PROVIDER_SITE_OTHER): Payer: Self-pay | Admitting: General Surgery

## 2014-04-24 ENCOUNTER — Ambulatory Visit (INDEPENDENT_AMBULATORY_CARE_PROVIDER_SITE_OTHER): Payer: PRIVATE HEALTH INSURANCE | Admitting: General Surgery

## 2014-04-24 VITALS — BP 100/60 | HR 78 | Temp 98.2°F | Resp 14 | Ht 66.0 in | Wt 130.6 lb

## 2014-04-24 DIAGNOSIS — C189 Malignant neoplasm of colon, unspecified: Secondary | ICD-10-CM

## 2014-04-24 NOTE — Patient Instructions (Signed)
We will schedule Port-a-cath removal for you.

## 2014-04-24 NOTE — Progress Notes (Signed)
Procedure:  Laparoscopic assisted sigmoid colectomy  Date: 04/24/2013  Pathology: Stage III adenocarcinoma  Hx:  She has done well with her chemotherapy. She has no abdominal pains. She is eating well. Her bowels are moving well. No rectal bleeding. She is here to discuss Port-A-Cath removal.  PE: General-Well-developed, well-nourished, in no acute distress.  Abdomen-Soft, nontender, no hepatomegaly, well-healed scars without hernia  Lymph nodes-No palpable supraclavicular or periumbilical adenopathy.  Assessment:  Stage III colon cancer status post sigmoid colectomy. She has completed her chemotherapy and is doing well. She has an indwelling Port-A-Cath.  Plan:  Outpatient Port-A-Cath removal. The procedure and risks were discussed with her. Risks include but are not limited to bleeding, infection, wound healing problems. She seems to understand this and agrees with the plan.

## 2014-05-17 ENCOUNTER — Other Ambulatory Visit (INDEPENDENT_AMBULATORY_CARE_PROVIDER_SITE_OTHER): Payer: Self-pay

## 2014-05-17 DIAGNOSIS — Z452 Encounter for adjustment and management of vascular access device: Secondary | ICD-10-CM

## 2014-05-17 MED ORDER — HYDROCODONE-ACETAMINOPHEN 7.5-325 MG PO TABS: 1.0000 | ORAL_TABLET | ORAL | Status: DC | PRN

## 2014-10-07 ENCOUNTER — Telehealth: Payer: Self-pay | Admitting: Oncology

## 2014-10-07 ENCOUNTER — Other Ambulatory Visit (HOSPITAL_BASED_OUTPATIENT_CLINIC_OR_DEPARTMENT_OTHER): Payer: PRIVATE HEALTH INSURANCE

## 2014-10-07 ENCOUNTER — Ambulatory Visit (HOSPITAL_BASED_OUTPATIENT_CLINIC_OR_DEPARTMENT_OTHER): Payer: PRIVATE HEALTH INSURANCE | Admitting: Oncology

## 2014-10-07 VITALS — BP 105/66 | HR 78 | Temp 98.6°F | Resp 18 | Ht 66.0 in | Wt 131.4 lb

## 2014-10-07 DIAGNOSIS — C187 Malignant neoplasm of sigmoid colon: Secondary | ICD-10-CM

## 2014-10-07 DIAGNOSIS — R590 Localized enlarged lymph nodes: Secondary | ICD-10-CM

## 2014-10-07 DIAGNOSIS — G620 Drug-induced polyneuropathy: Secondary | ICD-10-CM

## 2014-10-07 LAB — CEA: CEA: <0.5 ng/mL (ref 0.0–5.0)

## 2014-10-07 NOTE — Progress Notes (Signed)
  [facility] OFFICE PROGRESS NOTE   Diagnosis: colon cancer  INTERVAL HISTORY:   Priscilla Hill returns as scheduled. She feels well. She is working. No difficulty with bowel function. She continues to have numbness in the feet. No numbness in the hands. She reports "dryness "of the nose.  Objective:  Vital signs in last 24 hours:  Blood pressure 105/66, pulse 78, temperature 98.6 F (37 C), temperature source Oral, resp. rate 18, height $RemoveBe'5\' 6"'xmmmllZqX$  (1.676 m), weight 131 lb 6.4 oz (59.603 kg), SpO2 100 %.    HEENT: no thrush or ulcers Lymphatics: no cervical, supraclavicular, right axillary, or inguinal nodes . Soft mobile 1/2-1 cm left axillary node Resp: lungs clear bilaterally Cardio: regular rate and rhythm GI: no hepatosplenomegaly, nontender, no mass Vascular: no leg edema Breast: Left breast without mass    Lab Results:    Lab Results  Component Value Date   CEA <0.5 03/04/2014    Imaging:  No results found.  Medications: I have reviewed the patient's current medications.  Assessment/Plan: 1. 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 The tumor returned microsatellite stable with no loss of mismatch repair protein expression.  Cycle 1 adjuvant FOLFOX chemotherapy 05/28/2013.   Oxaliplatin was held beginning with cycle 8 due to neuropathy symptoms.   Final cycle of 5-fluorouracil/leucovorin given on 10/29/2013.  03/04/2014 CEA less than 0.5.  04/02/2014 CT chest/abdomen/pelvis negative for metastatic disease.  Colonoscopy 04/17/2014-negative 2. History of microcytic anemia.  3. Port-A-Cath placement 05/24/2013. 4. History of uterine fibroids. 5. Nausea following cycle 1 FOLFOX. Aloxi and prophylactic Decadron were added with cycle 2. She had nausea following cycle 2. Emend was added with cycle 3. Improved. No nausea following cycle 5 or 6. 6. Mild neutropenia  secondary chemotherapy. She received Neulasta beginning with cycle 4 FOLFOX  7. Abdominal discomfort following cycle 4 FOLFOX. Question gastritis related to steroids. She began Prilosec 20 mg daily. 8. History of mild thrombocytopenia secondary chemotherapy. 9. Pruritus and erythema over the palms beginning day 4 following cycle 5 FOLFOX and lasting approximately 3 days. Question hand-foot syndrome related to 5-fluorouracil, question allergic reaction. She was instructed to contact the office if she experiences this again. 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 Persistent numbness in the feet 11. Soft mobile left axillary lymph node on exam 10/07/2014   Disposition:  Ms. Galanti remains in clinical remission from colon cancer. We will follow up on the CEA from today. She will return for an office visit and surveillance CT scans in 6 months. The small mobile left axillary lymph node is likely a benign finding.she will be due for a mammogram next year.  Betsy Coder, MD  10/07/2014  9:19 AM

## 2014-10-07 NOTE — Telephone Encounter (Signed)
Pt confirmed labs/ov per 11/16 POF, gave pt AVS.... KJ °

## 2014-10-16 ENCOUNTER — Other Ambulatory Visit (INDEPENDENT_AMBULATORY_CARE_PROVIDER_SITE_OTHER): Payer: PRIVATE HEALTH INSURANCE

## 2014-10-16 ENCOUNTER — Encounter: Payer: Self-pay | Admitting: Internal Medicine

## 2014-10-16 ENCOUNTER — Ambulatory Visit (INDEPENDENT_AMBULATORY_CARE_PROVIDER_SITE_OTHER): Payer: PRIVATE HEALTH INSURANCE | Admitting: Internal Medicine

## 2014-10-16 VITALS — BP 120/74 | HR 101 | Temp 99.3°F | Wt 128.0 lb

## 2014-10-16 DIAGNOSIS — F411 Generalized anxiety disorder: Secondary | ICD-10-CM

## 2014-10-16 DIAGNOSIS — C187 Malignant neoplasm of sigmoid colon: Secondary | ICD-10-CM

## 2014-10-16 DIAGNOSIS — R110 Nausea: Secondary | ICD-10-CM

## 2014-10-16 DIAGNOSIS — T7840XD Allergy, unspecified, subsequent encounter: Secondary | ICD-10-CM

## 2014-10-16 LAB — CBC WITH DIFFERENTIAL/PLATELET
BASOS ABS: 0 10*3/uL (ref 0.0–0.1)
Basophils Relative: 0.3 % (ref 0.0–3.0)
Eosinophils Absolute: 0 10*3/uL (ref 0.0–0.7)
Eosinophils Relative: 0.6 % (ref 0.0–5.0)
HCT: 40.8 % (ref 36.0–46.0)
Hemoglobin: 13.8 g/dL (ref 12.0–15.0)
LYMPHS ABS: 1.3 10*3/uL (ref 0.7–4.0)
Lymphocytes Relative: 16.1 % (ref 12.0–46.0)
MCHC: 33.7 g/dL (ref 30.0–36.0)
MCV: 91.4 fl (ref 78.0–100.0)
MONO ABS: 0.5 10*3/uL (ref 0.1–1.0)
Monocytes Relative: 6.2 % (ref 3.0–12.0)
Neutro Abs: 6.3 10*3/uL (ref 1.4–7.7)
Neutrophils Relative %: 76.8 % (ref 43.0–77.0)
PLATELETS: 242 10*3/uL (ref 150.0–400.0)
RBC: 4.47 Mil/uL (ref 3.87–5.11)
RDW: 13 % (ref 11.5–15.5)
WBC: 8.2 10*3/uL (ref 4.0–10.5)

## 2014-10-16 LAB — VITAMIN D 25 HYDROXY (VIT D DEFICIENCY, FRACTURES): VITD: 27.61 ng/mL — ABNORMAL LOW (ref 30.00–100.00)

## 2014-10-16 LAB — URINALYSIS, ROUTINE W REFLEX MICROSCOPIC
BILIRUBIN URINE: NEGATIVE
KETONES UR: NEGATIVE
Leukocytes, UA: NEGATIVE
NITRITE: NEGATIVE
Specific Gravity, Urine: 1.02 (ref 1.000–1.030)
Total Protein, Urine: NEGATIVE
Urine Glucose: NEGATIVE
Urobilinogen, UA: 0.2 (ref 0.0–1.0)
pH: 6.5 (ref 5.0–8.0)

## 2014-10-16 LAB — BASIC METABOLIC PANEL
BUN: 10 mg/dL (ref 6–23)
CO2: 24 meq/L (ref 19–32)
Calcium: 9.3 mg/dL (ref 8.4–10.5)
Chloride: 105 mEq/L (ref 96–112)
Creatinine, Ser: 0.7 mg/dL (ref 0.4–1.2)
GFR: 97.46 mL/min (ref >60.00)
GLUCOSE: 84 mg/dL (ref 70–99)
POTASSIUM: 3.8 meq/L (ref 3.5–5.1)
Sodium: 139 mEq/L (ref 135–145)

## 2014-10-16 LAB — HEPATIC FUNCTION PANEL
ALT: 19 U/L (ref 0–35)
AST: 24 U/L (ref 0–37)
Albumin: 4.1 g/dL (ref 3.5–5.2)
Alkaline Phosphatase: 53 U/L (ref 39–117)
BILIRUBIN TOTAL: 1 mg/dL (ref 0.2–1.2)
Bilirubin, Direct: 0.1 mg/dL (ref 0.0–0.3)
Total Protein: 6.8 g/dL (ref 6.0–8.3)

## 2014-10-16 LAB — VITAMIN B12: Vitamin B-12: 813 pg/mL (ref 211–911)

## 2014-10-16 LAB — TSH: TSH: 1.23 u[IU]/mL (ref 0.35–4.50)

## 2014-10-16 LAB — H. PYLORI ANTIBODY, IGG: H PYLORI IGG: POSITIVE — AB

## 2014-10-16 MED ORDER — TRIAMCINOLONE ACETONIDE 0.5 % EX CREA: 1.0000 "application " | TOPICAL_CREAM | Freq: Three times a day (TID) | CUTANEOUS | Status: DC

## 2014-10-16 MED ORDER — LORATADINE 10 MG PO TABS: 10.0000 mg | ORAL_TABLET | Freq: Every day | ORAL | Status: DC | PRN

## 2014-10-16 MED ORDER — VITAMIN D 1000 UNITS PO TABS: 1000.0000 [IU] | ORAL_TABLET | Freq: Every day | ORAL | Status: AC

## 2014-10-16 MED ORDER — RANITIDINE HCL 150 MG PO TABS: 150.0000 mg | ORAL_TABLET | Freq: Two times a day (BID) | ORAL | Status: DC

## 2014-10-16 MED ORDER — ALPRAZOLAM 0.25 MG PO TABS: 0.2500 mg | ORAL_TABLET | Freq: Two times a day (BID) | ORAL | Status: DC | PRN

## 2014-10-16 NOTE — Patient Instructions (Signed)
Gluten free trial (no wheat products) for 4-6 weeks. OK to use gluten-free bread and gluten-free pasta.  Milk free trial (no milk, ice cream, cheese and yogurt) for 4-6 weeks. OK to use almond, coconut, rice or soy milk. "Almond breeze" brand tastes good.  

## 2014-10-16 NOTE — Progress Notes (Signed)
Pre visit review using our clinic review tool, if applicable. No additional management support is needed unless otherwise documented below in the visit note. 

## 2014-10-16 NOTE — Assessment & Plan Note (Signed)
11/15 grief - mom died Xanax prn - low dose  Potential benefits of a long term benzodiazepines  use as well as potential risks  and complications were explained to the patient and were aknowledged.

## 2014-10-16 NOTE — Progress Notes (Signed)
   Subjective:    HPI  C/o stress: mom had a CVA and died last night. C/o throat spasms, nausea x 2-3 weeks  C/o nose itching, rash  C/o rash on neck  C/o neuropathy post-chemo (finished in Dec 2014)  BP Readings from Last 3 Encounters:  10/16/14 120/74  10/07/14 105/66  04/24/14 100/60   Wt Readings from Last 3 Encounters:  10/16/14 128 lb (58.06 kg)  10/07/14 131 lb 6.4 oz (59.603 kg)  04/24/14 130 lb 9.6 oz (59.24 kg)     Review of Systems  Constitutional: Negative for fever, chills, diaphoresis, activity change, appetite change, fatigue and unexpected weight change.  HENT: Negative for congestion, dental problem, ear pain, hearing loss, mouth sores, postnasal drip, sinus pressure, sneezing, sore throat and voice change.   Eyes: Negative for pain and visual disturbance.  Respiratory: Negative for cough, chest tightness, wheezing and stridor.   Cardiovascular: Negative for chest pain, palpitations and leg swelling.  Gastrointestinal: Positive for blood in stool. Negative for nausea, vomiting, abdominal pain, abdominal distention and rectal pain.  Genitourinary: Negative for dysuria, hematuria, decreased urine volume, vaginal bleeding, vaginal discharge, difficulty urinating, vaginal pain and menstrual problem.  Musculoskeletal: Negative for back pain, joint swelling, gait problem and neck pain.  Skin: Negative for color change, rash and wound.  Neurological: Negative for dizziness, tremors, syncope, speech difficulty, weakness and light-headedness.  Hematological: Negative for adenopathy.  Psychiatric/Behavioral: Negative for suicidal ideas, hallucinations, behavioral problems, confusion, sleep disturbance, dysphoric mood and decreased concentration. The patient is not hyperactive.    BP 120/74 mmHg  Pulse 101  Temp(Src) 99.3 F (37.4 C) (Oral)  Wt 128 lb (58.06 kg)  SpO2 97%     Objective:   Physical Exam  Constitutional: She appears well-developed. No distress.   HENT:  Head: Normocephalic.  Right Ear: External ear normal.  Left Ear: External ear normal.  Nose: Nose normal.  Mouth/Throat: Oropharynx is clear and moist.  Eyes: Conjunctivae are normal. Pupils are equal, round, and reactive to light. Right eye exhibits no discharge. Left eye exhibits no discharge.  Neck: Normal range of motion. Neck supple. No JVD present. No tracheal deviation present. No thyromegaly present.  Cardiovascular: Normal rate, regular rhythm and normal heart sounds.   Pulmonary/Chest: No stridor. No respiratory distress. She has no wheezes.  Abdominal: Soft. Bowel sounds are normal. She exhibits no distension and no mass. There is no tenderness. There is no rebound and no guarding.  Musculoskeletal: She exhibits no edema or tenderness.  Lymphadenopathy:    She has no cervical adenopathy.  Neurological: She displays normal reflexes. No cranial nerve deficit. She exhibits normal muscle tone. Coordination normal.  Skin: No rash noted. No erythema.  Psychiatric: She has a normal mood and affect. Her behavior is normal. Judgment and thought content normal.   Lab Results  Component Value Date   WBC 4.8 01/16/2014   HGB 12.2 01/16/2014   HCT 36.7 01/16/2014   PLT 171.0 01/16/2014   GLUCOSE 102 04/05/2014   CHOL 186 11/14/2012   TRIG 71.0 11/14/2012   HDL 47.70 11/14/2012   LDLCALC 124* 11/14/2012   ALT 29 01/16/2014   AST 29 01/16/2014   NA 141 04/05/2014   K 4.0 04/05/2014   CL 105 01/16/2014   CREATININE 0.7 04/05/2014   BUN 10.1 04/05/2014   CO2 24 04/05/2014   TSH 1.24 01/16/2014   INR 0.94 05/23/2013          Assessment & Plan:

## 2014-10-16 NOTE — Assessment & Plan Note (Signed)
Lab

## 2014-10-17 LAB — RESPIRATORY ALLERGY PROFILE REGION II ~~LOC~~
Allergen, Comm Silver Birch, t9: <0.1 kU/L
Allergen, D pternoyssinus,d7: <0.1 kU/L
Allergen, Mouse Urine Protein, e78: <0.1 kU/L
Allergen, Mulberry, t76: <0.1 kU/L
Alternaria Alternata: <0.1 kU/L
Aspergillus fumigatus, m3: <0.1 kU/L
Bermuda Grass: <0.1 kU/L
Box Elder IgE: <0.1 kU/L
Cat Dander: <0.1 kU/L
Cockroach: <0.1 kU/L
Common Ragweed: <0.1 kU/L
D. farinae: <0.1 kU/L
Oak: <0.1 kU/L
Penicillium Notatum: <0.1 kU/L
Rough Pigweed  IgE: <0.1 kU/L
Sheep Sorrel IgE: <0.1 kU/L

## 2014-10-17 LAB — ALLERGEN FOOD PROFILE SPECIFIC IGE
Apple: <0.1 kU/L
Chicken IgE: <0.1 kU/L
Corn: <0.1 kU/L
Fish Cod: <0.1 kU/L
IgE (Immunoglobulin E), Serum: 12 kU/L (ref <115)
Milk IgE: <0.1 kU/L
Soybean IgE: <0.1 kU/L
Tomato IgE: <0.1 kU/L
Tuna IgE: <0.1 kU/L

## 2014-10-18 ENCOUNTER — Encounter: Payer: Self-pay | Admitting: Internal Medicine

## 2014-10-21 IMAGING — CT CT ABD-PELV W/ CM
1 of 3 series · 14 of 32 positions shown, 19 images · IV contrast (Omnipaque 300)
Comparison: None.

CLINICAL DATA: Colonic mass identified on recent colonoscopy.
Hematochezia.

CT ABDOMEN AND PELVIS WITH CONTRAST
TECHNIQUE: Multidetector CT imaging of the abdomen and pelvis was
performed following the standard protocol during bolus
administration of intravenous contrast.
Contrast: 100mL OMNIPAQUE IOHEXOL 300 MG/ML  SOLN

[Series 2: abd/ pel 5mm · axial · 0.58mm/px · z∈[-472,-107]mm · 14 of 83 slices shown, 19 images]
[im 5/83  soft-tissue]
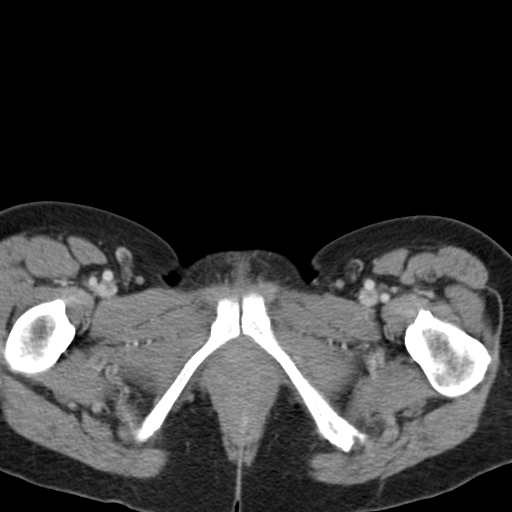
[im 5/83  bone]
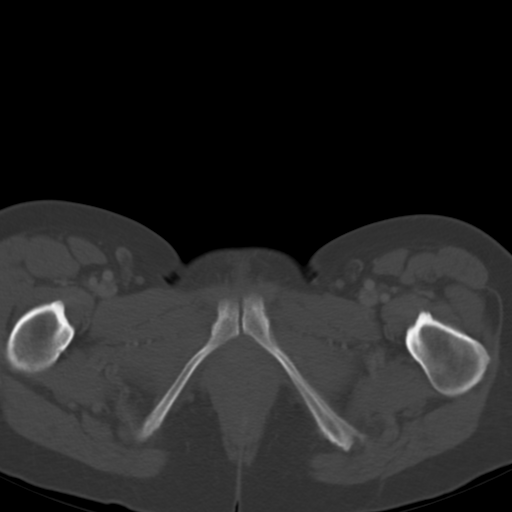
[im 13/83  soft-tissue]
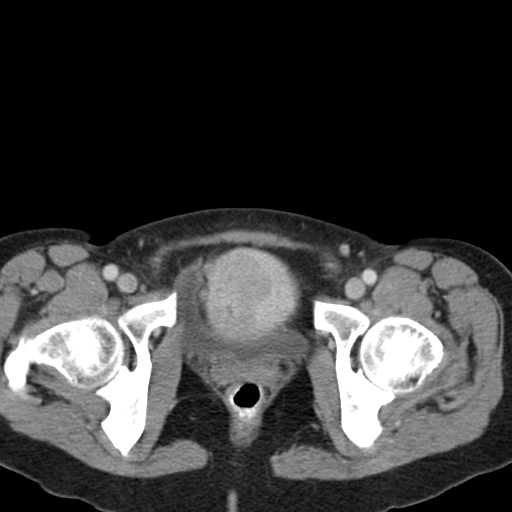
[im 18/83  soft-tissue]
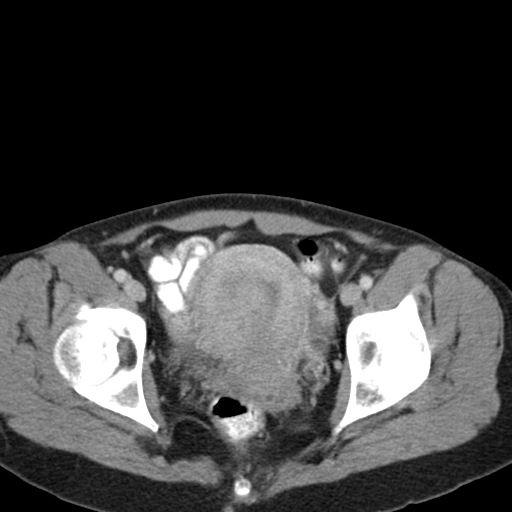
[im 22/83  soft-tissue]
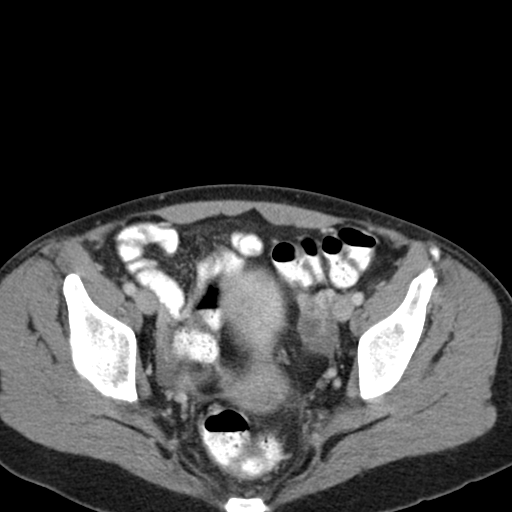
[im 31/83  soft-tissue]
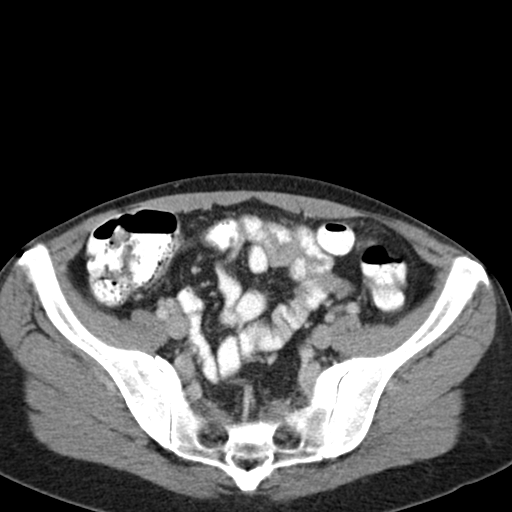
[im 35/83  soft-tissue]
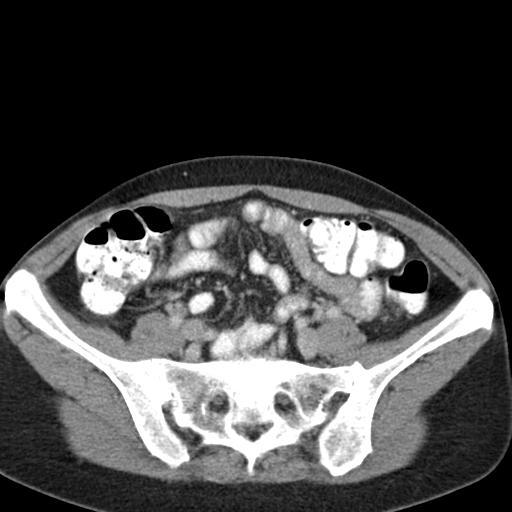
[im 44/83  soft-tissue]
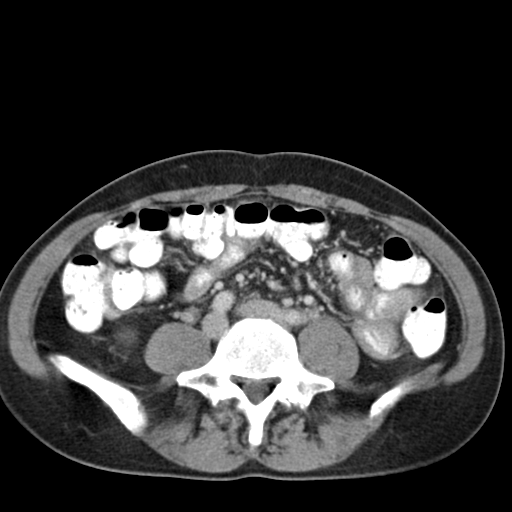
[im 48/83  soft-tissue]
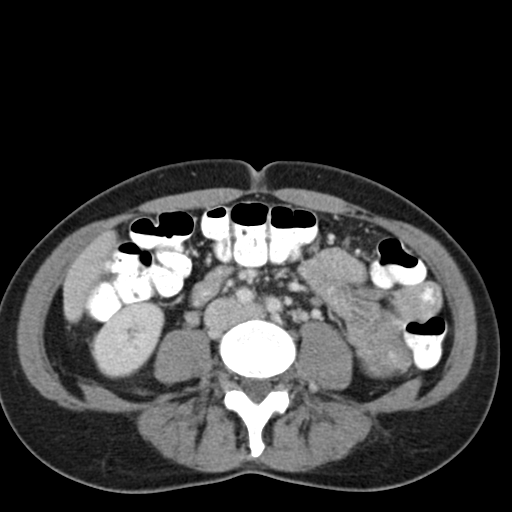
[im 52/83  soft-tissue]
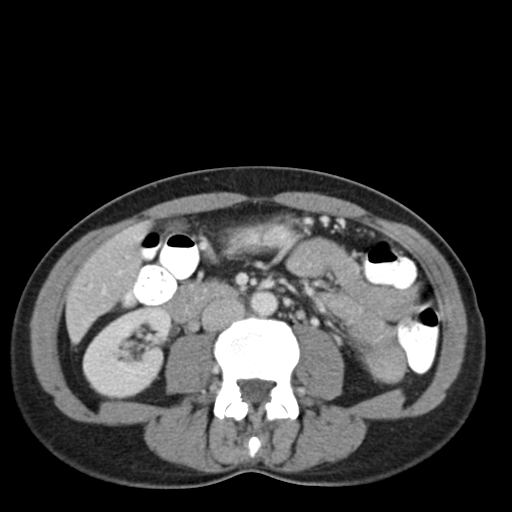
[im 52/83  bone]
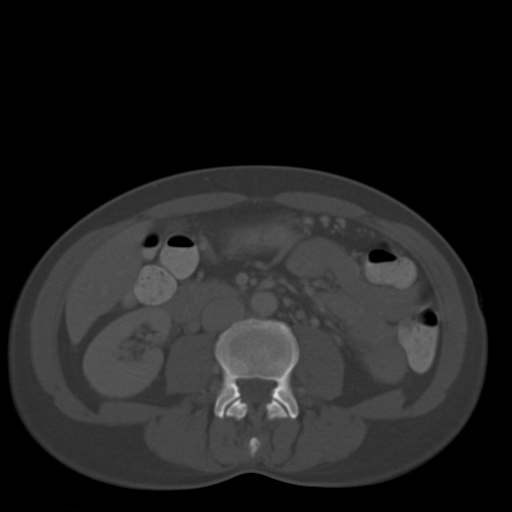
[im 61/83  soft-tissue]
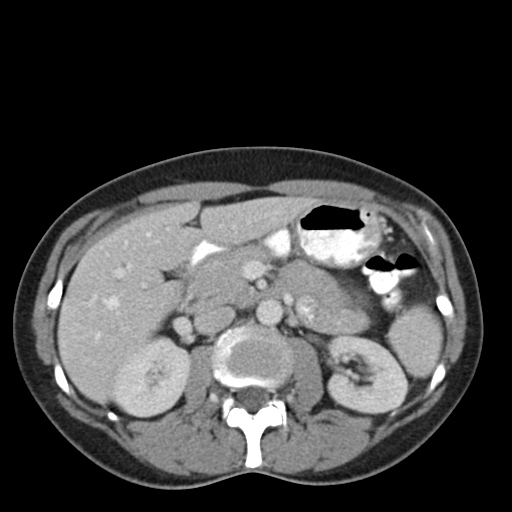
[im 65/83  soft-tissue]
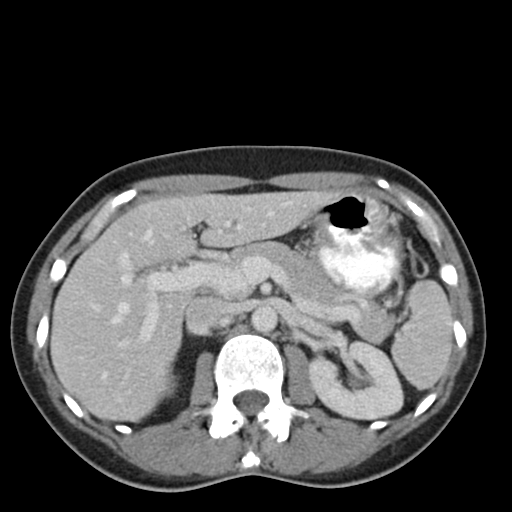
[im 65/83  lung]
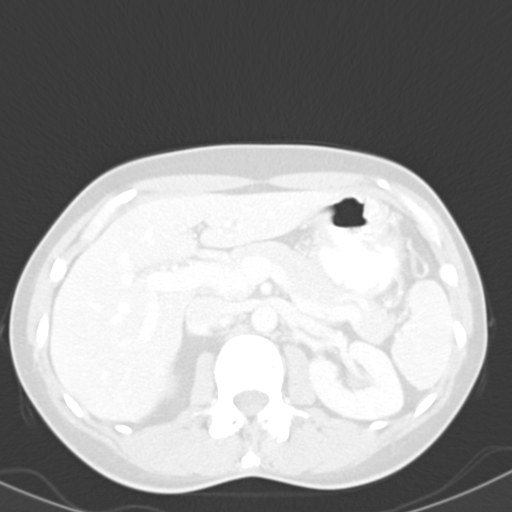
[im 70/83  soft-tissue]
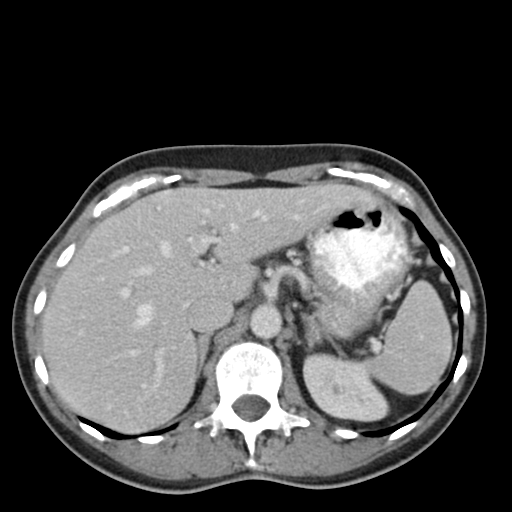
[im 70/83  lung]
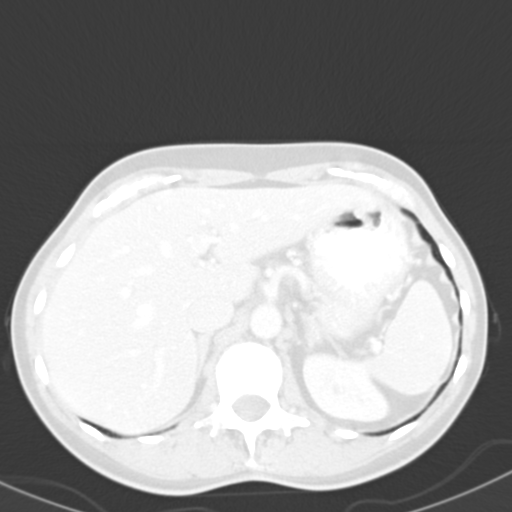
[im 74/83  lung]
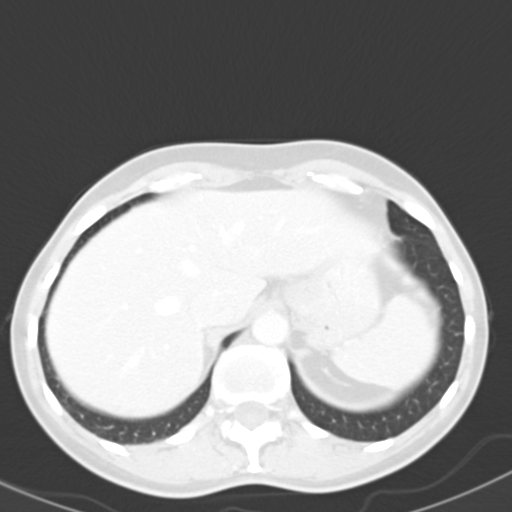
[im 78/83  soft-tissue]
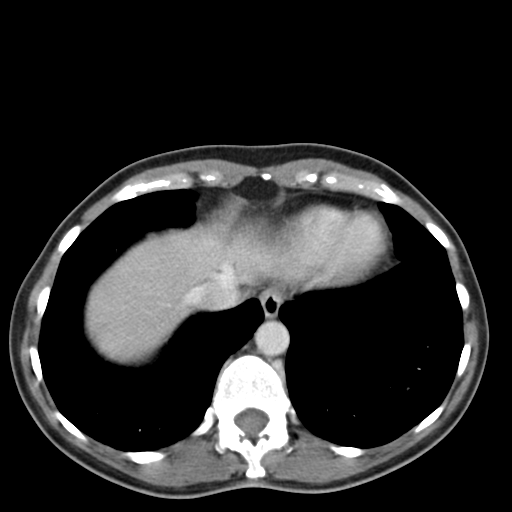
[im 78/83  lung]
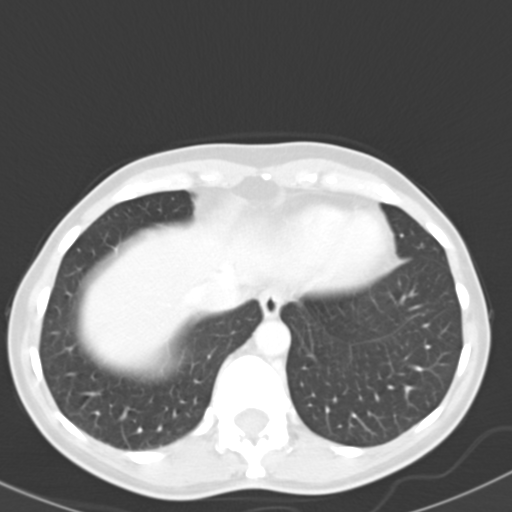

[14 of 32 positions shown; findings below may reference images not displayed]

FINDINGS: Intraluminal soft tissue density is seen in the mid
sigmoid colon measuring approximately 2.2 cm, consistent with the
mass recently diagnosed by colonoscopy.  There is no evidence of
pericolonic lymphadenopathy.  The Essure micro-inserts are again
seen within the fallopian tubes.  A heterogeneous mass is seen in
the right uterine fundus which measures approximately 5 cm, and
likely represents a fibroid or focal adenomyoma.

The adnexal regions are unremarkable appearance.  No
lymphadenopathy identified within the pelvis or abdomen.

No liver masses are identified.  Gallbladder is unremarkable.  The
pancreas, spleen, adrenal glands, and kidneys are normal
appearance.  No evidence of hydronephrosis.
IMPRESSION: 1.  2.2 cm intraluminal soft tissue density in the mid sigmoid
colon, consistent with known colonic mass.
2.  No evidence of metastatic disease.
3.  5 cm heterogeneous mass in the right uterine fundus, suspicious
for fibroid or focal adenomyoma.  Pelvic MRI without and with
contrast could be performed for further evaluation if clinically
warranted.

## 2014-10-27 ENCOUNTER — Other Ambulatory Visit: Payer: Self-pay | Admitting: Internal Medicine

## 2014-10-27 DIAGNOSIS — E559 Vitamin D deficiency, unspecified: Secondary | ICD-10-CM

## 2014-10-27 DIAGNOSIS — A048 Other specified bacterial intestinal infections: Secondary | ICD-10-CM | POA: Insufficient documentation

## 2014-10-27 MED ORDER — ERGOCALCIFEROL 1.25 MG (50000 UT) PO CAPS: 50000.0000 [IU] | ORAL_CAPSULE | ORAL | Status: DC

## 2014-10-27 MED ORDER — OMEPRAZOLE 40 MG PO CPDR: 40.0000 mg | DELAYED_RELEASE_CAPSULE | Freq: Two times a day (BID) | ORAL | Status: DC

## 2014-10-27 MED ORDER — CLARITHROMYCIN 500 MG PO TABS: 500.0000 mg | ORAL_TABLET | Freq: Two times a day (BID) | ORAL | Status: DC

## 2014-10-27 MED ORDER — AMOXICILLIN 500 MG PO CAPS: 1000.0000 mg | ORAL_CAPSULE | Freq: Two times a day (BID) | ORAL | Status: DC

## 2014-10-28 ENCOUNTER — Other Ambulatory Visit: Payer: Self-pay | Admitting: Internal Medicine

## 2014-10-28 MED ORDER — AMOXICILLIN 500 MG PO CAPS
1000.0000 mg | ORAL_CAPSULE | Freq: Two times a day (BID) | ORAL | Status: DC
Start: 2014-10-28 — End: 2015-02-26

## 2014-10-28 MED ORDER — CLARITHROMYCIN 500 MG PO TABS: 500.0000 mg | ORAL_TABLET | Freq: Two times a day (BID) | ORAL | Status: DC

## 2014-10-28 MED ORDER — ERGOCALCIFEROL 1.25 MG (50000 UT) PO CAPS: 50000.0000 [IU] | ORAL_CAPSULE | ORAL | Status: DC

## 2014-10-28 MED ORDER — OMEPRAZOLE 40 MG PO CPDR: 40.0000 mg | DELAYED_RELEASE_CAPSULE | Freq: Two times a day (BID) | ORAL | Status: DC

## 2014-11-07 ENCOUNTER — Encounter: Payer: Self-pay | Admitting: Internal Medicine

## 2014-11-19 ENCOUNTER — Telehealth: Payer: Self-pay | Admitting: *Deleted

## 2014-11-19 ENCOUNTER — Telehealth: Payer: Self-pay | Admitting: Internal Medicine

## 2014-11-19 NOTE — Telephone Encounter (Signed)
[facility] - Client [facility] Patient Name: Priscilla Hill Gender: Female DOB: [birth year] Age: 48 Y 9 M 19 D Return Phone Number: [phone] (Primary) Address: [address] City/State/Zip: [place] [address] Client [facility] - Client Client Site [place] - Day Physician Plotnikov, Alex Contact Type Call Call Type Triage / Clinical Relationship To Patient Self Return Phone Number [phone] (Primary) Chief Complaint BACK PAIN - with sudden weakness of arms or legs Initial Comment [name] states she is having severe lower back pain, trouble walking. Weakness in legs. PreDisposition Call Doctor Nurse Assessment Nurse: Mallie Mussel, RN, Alveta Heimlich Date/Time Eilene Ghazi Time): 11/19/2014 9:52:10 AM Confirm and document reason for call. If symptomatic, describe symptoms. ---[name] states that she has lower back pain which began yesterday. 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 She has some difficulty walking and weakness in her legs. She denies numbness, but she has shooting pain in her right leg. She rates her pain as 8-9 on 0-10 scale when she is up and walking around. Has the patient traveled out of the country within the last 30 days? ---No Does the patient require triage? ---Yes Related visit to physician within the last 2 weeks? ---No Does the PT have any chronic conditions? (i.e. diabetes, asthma, etc.) ---No Did the patient indicate they were pregnant? ---No Guidelines Guideline Title Affirmed Question Affirmed Notes Nurse Date/Time (Eastern Time) Back Pain High-risk adult (e.g., history of cancer, HIV, or IV drug abuse) Colon CA last year. Mallie Mussel, RN, Alveta Heimlich 11/19/2014 9:55:07 AM Disp. Time Eilene Ghazi Time) Disposition Final User 11/19/2014 9:50:51 AM Send to Urgent Dione Housekeeper 11/19/2014 9:59:24 AM See Physician within 24 Hours Yes  Mallie Mussel, RN, Ola Spurr Understands: Yes PLEASE NOTE: All timestamps contained within this report are represented as [place] Standard Time. CONFIDENTIALTY NOTICE: This fax transmission is intended only for the addressee. It contains information that is legally privileged, confidential or otherwise protected from use or disclosure. If you are not the intended recipient, you are strictly prohibited from reviewing, disclosing, copying using or disseminating any of this information or taking any action in reliance on or regarding this information. If you have received this fax in error, please notify us immediately by telephone so that we can arrange for its return to [place]. Phone: [phone], Toll-Free: [phone], Fax: [phone] Page: 2 of 2 Call Id: [id number] Disagree/Comply: Comply Care Advice Given Per Guideline SEE PHYSICIAN WITHIN 24 HOURS: IBUPROFEN (E.G., MOTRIN, ADVIL): * Do not take nonsteroidal anti-inflammatory drugs (NSAIDs) if you have stomach problems, kidney disease, heart failure, or other contraindications to using this type of medication. * Do not take NSAID medications for over 7 days without consulting your PCP. * GASTROINTESTINAL RISK: There is an increased risk of stomach ulcers, GI bleeding, perforation. * CARDIOVASCULAR RISK: There may be an increased risk of heart attack and stroke. * You become worse. After Care Instructions Given Call Event Type User Date / Time Description Comments User: Reeves Forth, RN Date/Time Eilene Ghazi Time): 11/19/2014 10:06:28 AM I called the backline and did a warm transfer to Tammy for further assistance with an appointment. Referrals REFERRED TO PCP OFFICE REFERRED TO PCP OFFICE

## 2014-11-19 NOTE — Telephone Encounter (Signed)
FYI.  Patient was transferred from [place] with lower back pain.  [facility] suggest she be seen within 24 hours.  Could not find an opening with our office or another office.  Per [facility]'s suggestion referred patient to ER.

## 2014-11-19 NOTE — Telephone Encounter (Signed)
I could see her on Thur at 1:15 Thx

## 2014-11-20 ENCOUNTER — Telehealth: Payer: Self-pay | Admitting: *Deleted

## 2014-11-20 NOTE — Telephone Encounter (Signed)
Called pt back she stated she went ahead an went to urgent care on yesterday...Priscilla Hill

## 2014-11-20 NOTE — Telephone Encounter (Signed)
[facility] - Client [facility] Call Center Patient Name: Priscilla Hill Gender: Female DOB: August 22, 1966 Age: 48 Y 9 M 19 D Return Phone Number: [phone] (Primary) Address: [address] City/State/Zip: [place] [address] Client [facility] - Client Client Site [place] - Day Physician Plotnikov, Alex Contact Type Call Call Type Triage / Clinical Relationship To Patient Self Return Phone Number [phone] (Primary) Chief Complaint BACK PAIN - with sudden weakness of arms or legs Initial Comment [name] states she is having severe lower back pain, trouble walking. Weakness in legs. PreDisposition Call Doctor Nurse Assessment Nurse: Mallie Mussel, RN, Alveta Heimlich Date/Time Eilene Ghazi Time): 11/19/2014 9:52:10 AM Confirm and document reason for call. If symptomatic, describe symptoms. ---[name] states that she has lower back pain which began yesterday. 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 She has some difficulty walking and weakness in her legs. She denies numbness, but she has shooting pain in her right leg. She rates her pain as 8-9 on 0-10 scale when she is up and walking around. Has the patient traveled out of the country within the last 30 days? ---No Does the patient require triage? ---Yes Related visit to physician within the last 2 weeks? ---No Does the PT have any chronic conditions? (i.e. diabetes, asthma, etc.) ---No Did the patient indicate they were pregnant? ---No Guidelines Guideline Title Affirmed Question Affirmed Notes Nurse Date/Time (Eastern Time) Back Pain High-risk adult (e.g., history of cancer, HIV, or IV drug abuse) Colon CA last year. Mallie Mussel, RN, Alveta Heimlich 11/19/2014 9:55:07 AM Disp. Time Eilene Ghazi Time) Disposition Final User 11/19/2014 9:50:51 AM Send to Urgent Dione Housekeeper 11/19/2014 9:59:24 AM See Physician within 24 Hours Yes  Mallie Mussel, RN, Ola Spurr Understands: Yes PLEASE NOTE: All timestamps contained within this report are represented as [place] Standard Time. CONFIDENTIALTY NOTICE: This fax transmission is intended only for the addressee. It contains information that is legally privileged, confidential or otherwise protected from use or disclosure. If you are not the intended recipient, you are strictly prohibited from reviewing, disclosing, copying using or disseminating any of this information or taking any action in reliance on or regarding this information. If you have received this fax in error, please notify us immediately by telephone so that we can arrange for its return to [place]. Phone: [phone], Toll-Free: [phone], Fax: [phone] Page: 2 of 2 Call Id: [id number] Disagree/Comply: Comply Care Advice Given Per Guideline SEE PHYSICIAN WITHIN 24 HOURS: IBUPROFEN (E.G., MOTRIN, ADVIL): * Do not take nonsteroidal anti-inflammatory drugs (NSAIDs) if you have stomach problems, kidney disease, heart failure, or other contraindications to using this type of medication. * Do not take NSAID medications for over 7 days without consulting your PCP. * GASTROINTESTINAL RISK: There is an increased risk of stomach ulcers, GI bleeding, perforation. * CARDIOVASCULAR RISK: There may be an increased risk of heart attack and stroke. * You become worse. After Care Instructions Given Call Event Type User Date / Time Description

## 2014-11-25 ENCOUNTER — Ambulatory Visit (INDEPENDENT_AMBULATORY_CARE_PROVIDER_SITE_OTHER)
Admission: RE | Admit: 2014-11-25 | Discharge: 2014-11-25 | Disposition: A | Discharge status: Home / Self Care | Payer: PRIVATE HEALTH INSURANCE | Source: Ambulatory Visit | Attending: Family | Admitting: Family

## 2014-11-25 ENCOUNTER — Encounter: Payer: Self-pay | Admitting: Family

## 2014-11-25 ENCOUNTER — Ambulatory Visit (INDEPENDENT_AMBULATORY_CARE_PROVIDER_SITE_OTHER): Payer: PRIVATE HEALTH INSURANCE | Admitting: Family

## 2014-11-25 DIAGNOSIS — M545 Low back pain, unspecified: Secondary | ICD-10-CM | POA: Insufficient documentation

## 2014-11-25 DIAGNOSIS — M5441 Lumbago with sciatica, right side: Secondary | ICD-10-CM

## 2014-11-25 MED ORDER — NAPROXEN 500 MG PO TBEC: 500.0000 mg | DELAYED_RELEASE_TABLET | Freq: Two times a day (BID) | ORAL | Status: DC | PRN

## 2014-11-25 MED ORDER — PREDNISONE 10 MG PO TABS: ORAL_TABLET | ORAL | Status: DC

## 2014-11-25 NOTE — Assessment & Plan Note (Signed)
Symptoms and exam consistent with lumbar strain, however cannot rule out lumbar disc pathology. Obtain lumbar spine x-rays to rule out abnormalities. Continue his prescribed medications. Start prednisone taper. Start naproxen as needed for pain. Patient instructed to continue heating and stretching her back in 3-4 times per day. Follow up pending x-ray results or sooner if symptoms worsen fail to improve.

## 2014-11-25 NOTE — Patient Instructions (Signed)
Thank you for choosing [facility].  Summary/Instructions:  Your prescription(s) have been submitted to your pharmacy or been printed and provided for you. Please take as directed and contact our office if you believe you are having problem(s) with the medication(s) or have any questions.  Please stop by radiology on the basement level of the building for your x-rays. Your results will be released to [place] (or called to you) after review, usually within 72 hours after test completion. If any treatments or changes are necessary, you will be notified at that same time.   If your symptoms worsen or fail to improve, please contact our office for further instruction, or in case of emergency go directly to the emergency room at the closest medical facility.   Back Pain, Adult Low back pain is very common. About 1 in 5 people have back pain.The cause of low back pain is rarely dangerous. The pain often gets better over time.About half of people with a sudden onset of back pain feel better in just 2 weeks. About 8 in 10 people feel better by 6 weeks.  CAUSES Some common causes of back pain include:  Strain of the muscles or ligaments supporting the spine.  Wear and tear (degeneration) of the spinal discs.  Arthritis.  Direct injury to the back. DIAGNOSIS Most of the time, the direct cause of low back pain is not known.However, back pain can be treated effectively even when the exact cause of the pain is unknown.Answering your caregiver's questions about your overall health and symptoms is one of the most accurate ways to make sure the cause of your pain is not dangerous. If your caregiver needs more information, he or she may order lab work or imaging tests (X-rays or MRIs).However, even if imaging tests show changes in your back, this usually does not require surgery. HOME CARE INSTRUCTIONS For many people, back pain returns.Since low back pain is rarely dangerous, it is often a  condition that people can learn to [facility] their own.   Remain active. It is stressful on the back to sit or stand in one place. Do not sit, drive, or stand in one place for more than 30 minutes at a time. Take short walks on level surfaces as soon as pain allows.Try to increase the length of time you walk each day.  Do not stay in bed.Resting more than 1 or 2 days can delay your recovery.  Do not avoid exercise or work.Your body is made to move.It is not dangerous to be active, even though your back may hurt.Your back will likely heal faster if you return to being active before your pain is gone.  Pay attention to your body when you bend and lift. Many people have less discomfortwhen lifting if they bend their knees, keep the load close to their bodies,and avoid twisting. Often, the most comfortable positions are those that put less stress on your recovering back.  Find a comfortable position to sleep. Use a firm mattress and lie on your side with your knees slightly bent. If you lie on your back, put a pillow under your knees.  Only take over-the-counter or prescription medicines as directed by your caregiver. Over-the-counter medicines to reduce pain and inflammation are often the most helpful.Your caregiver may prescribe muscle relaxant drugs.These medicines help dull your pain so you can more quickly return to your normal activities and healthy exercise.  Put ice on the injured area.  Put ice in a plastic bag.  Place  a towel between your skin and the bag.  Leave the ice on for 15-20 minutes, 03-04 times a day for the first 2 to 3 days. After that, ice and heat may be alternated to reduce pain and spasms.  Ask your caregiver about trying back exercises and gentle massage. This may be of some benefit.  Avoid feeling anxious or stressed.Stress increases muscle tension and can worsen back pain.It is important to recognize when you are anxious or stressed and learn ways to  manage it.Exercise is a great option. SEEK MEDICAL CARE IF:  You have pain that is not relieved with rest or medicine.  You have pain that does not improve in 1 week.  You have new symptoms.  You are generally not feeling well. SEEK IMMEDIATE MEDICAL CARE IF:   You have pain that radiates from your back into your legs.  You develop new bowel or bladder control problems.  You have unusual weakness or numbness in your arms or legs.  You develop nausea or vomiting.  You develop abdominal pain.  You feel faint. Document Released: 11/08/2005 Document Revised: 05/09/2012 Document Reviewed: 03/12/2014 [facility] Patient Information 2015 [place], [place]. This information is not intended to replace advice given to you by your health care provider. Make sure you discuss any questions you have with your health care provider.

## 2014-11-25 NOTE — Progress Notes (Signed)
Pre visit review using our clinic review tool, if applicable. No additional management support is needed unless otherwise documented below in the visit note. 

## 2014-11-25 NOTE — Progress Notes (Signed)
Subjective:    Patient ID: Ward Priscilla Hill, female    DOB: [birth year], 49 y.o.   MRN: [id number]  Chief Complaint  Patient presents with  . Back Pain    started monday, said she bent over to put lotion on feet and said for 3 days it hurt so bad that she could barely walk, says now she feels very weak and doesn't know if its from pain, helps to use heating pad,     HPI:  Priscilla Hill is a 49 y.o. female who presents today for an acute visit.   Acute symptoms of sharp low back pain started about a week ago when she bent forward. Intensity at worst was described at 10/10 and is currently 8/10 when moving and was so bad that she could barely walk. Movement makes it worse and sitting makes it better. Irritated by coughing and sneezing. Now indicating having some weakness. Has tried a heating pad which has provided some relief and was seen at an urgent care which prescribed muscle relaxors and pain medication.     No Known Allergies   Current Outpatient Prescriptions on File Prior to Visit  Medication Sig Dispense Refill  . ALPRAZolam (XANAX) 0.25 MG tablet Take 1 tablet (0.25 mg total) by mouth 2 (two) times daily as needed for anxiety or sleep. 30 tablet 5  . amoxicillin (AMOXIL) 500 MG capsule Take 2 capsules (1,000 mg total) by mouth 2 (two) times daily. 56 capsule 0  . clarithromycin (BIAXIN) 500 MG tablet Take 1 tablet (500 mg total) by mouth 2 (two) times daily. 20 tablet 0  . ergocalciferol (VITAMIN D2) 50000 UNITS capsule Take 1 capsule (50,000 Units total) by mouth once a week. 6 capsule 0  . loratadine (CLARITIN) 10 MG tablet Take 1 tablet (10 mg total) by mouth daily as needed for allergies or itching. 100 tablet 3  . OVER THE COUNTER MEDICATION Pleo [place], Probiotic 42.5, Lymph Tone lll, Apo Stom, [place] Ease, Biotox Para    . ranitidine (ZANTAC) 150 MG tablet Take 1 tablet (150 mg total) by mouth 2 (two) times daily. 60 tablet 1  . triamcinolone cream (KENALOG) 0.5 % Apply 1  application topically 3 (three) times daily. 30 g 1  . cholecalciferol (VITAMIN D) 1000 UNITS tablet Take 1 tablet (1,000 Units total) by mouth daily. (Patient not taking: Reported on 11/25/2014) 100 tablet 3  . omeprazole (PRILOSEC) 40 MG capsule Take 1 capsule (40 mg total) by mouth 2 (two) times daily. (Patient not taking: Reported on 11/25/2014) 90 capsule 0   No current facility-administered medications on file prior to visit.    Review of Systems  Constitutional: Positive for activity change (Secondary to back pain).  Genitourinary:       Denies changes in bowel or bladder habits  Musculoskeletal: Positive for back pain. Negative for neck pain.  Neurological: Negative for numbness.      Objective:    BP 118/78 mmHg  Pulse 82  Temp(Src) 98.1 F (36.7 C) (Oral)  Resp 18  Ht 5\' 5"  (1.651 m)  Wt 132 lb 6.4 oz (60.056 kg)  BMI 22.03 kg/m2  SpO2 97% Nursing note and vital signs reviewed.  Physical Exam  Constitutional: She is oriented to person, place, and time. She appears well-developed and well-nourished. No distress.  Cardiovascular: Normal rate, regular rhythm, normal heart sounds and intact distal pulses.   Pulmonary/Chest: Effort normal and breath sounds normal.  Musculoskeletal:  No obvious deformity, discoloration, or edema  of lumbar spine noted. No palpable tenderness able to be elicited. Decreased forward flexion secondary to pain. Positive straight leg raise on the right side for back pain and increased radiculopathy. Sensation, pulses, hands reflexes are intact and appropriate.  Neurological: She is alert and oriented to person, place, and time.  Skin: Skin is warm and dry.  Psychiatric: She has a normal mood and affect. Her behavior is normal. Judgment and thought content normal.       Assessment & Plan:

## 2014-12-12 IMAGING — CR DG CHEST 1V PORT
1 series · 1 of 1 positions shown · non-contrast
Comparison: Chest x-ray of 04/17/2013

CLINICAL DATA: Port-A-Cath insertion

PORTABLE CHEST - 1 VIEW

[AP]
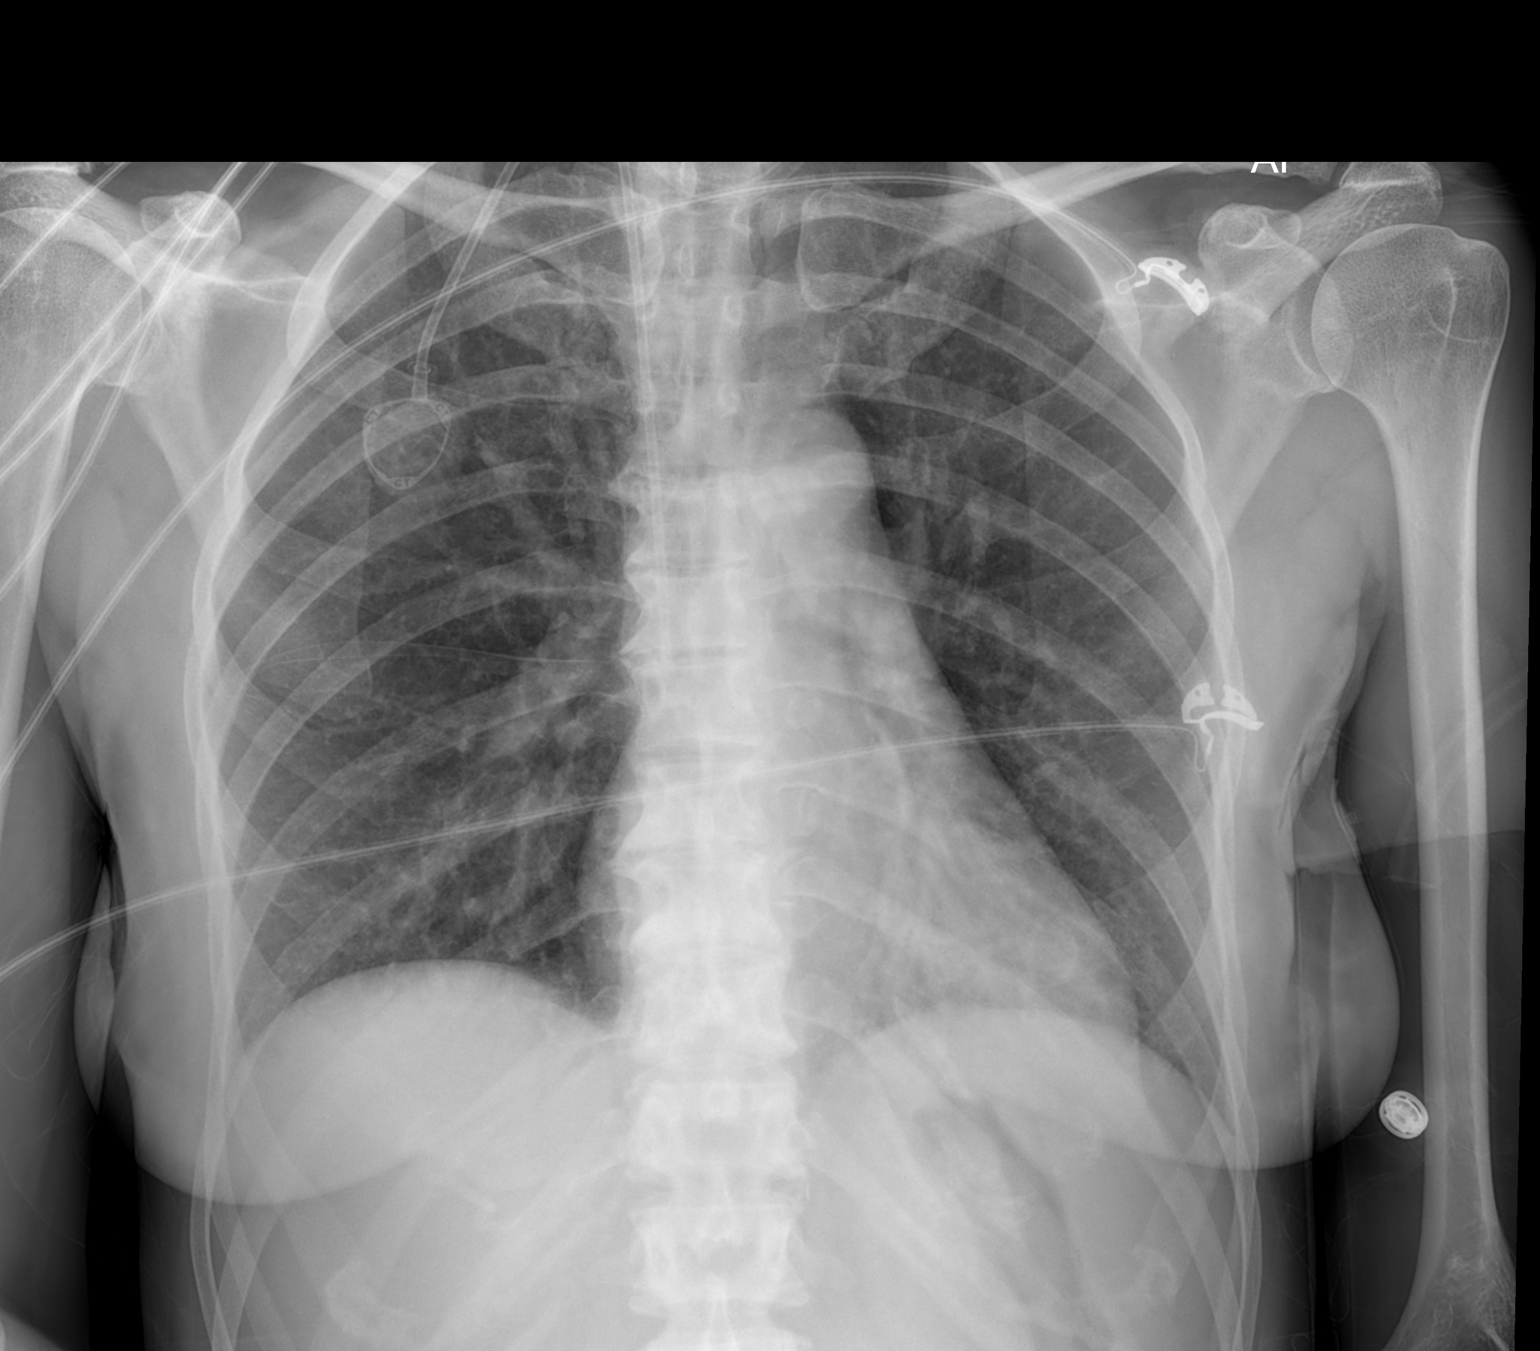

[1 of 1 positions shown; findings below may reference images not displayed]

FINDINGS: A right-sided Port-A-Cath is present with the tip
overlying the region of the lower SVC.  No pneumothorax is seen.
There is mild atelectasis at the left lung base.  Heart size is
stable.
IMPRESSION: Right-sided Port-A-Cath tip overlies the lower SVC.  No
pneumothorax.

## 2015-02-21 ENCOUNTER — Ambulatory Visit: Payer: Self-pay | Admitting: Internal Medicine

## 2015-02-26 ENCOUNTER — Encounter: Payer: Self-pay | Admitting: Internal Medicine

## 2015-02-26 ENCOUNTER — Ambulatory Visit (INDEPENDENT_AMBULATORY_CARE_PROVIDER_SITE_OTHER): Payer: PRIVATE HEALTH INSURANCE | Admitting: Internal Medicine

## 2015-02-26 ENCOUNTER — Other Ambulatory Visit (INDEPENDENT_AMBULATORY_CARE_PROVIDER_SITE_OTHER): Payer: PRIVATE HEALTH INSURANCE

## 2015-02-26 VITALS — BP 112/82 | HR 71 | Wt 134.0 lb

## 2015-02-26 DIAGNOSIS — N951 Menopausal and female climacteric states: Secondary | ICD-10-CM | POA: Diagnosis not present

## 2015-02-26 DIAGNOSIS — R232 Flushing: Secondary | ICD-10-CM

## 2015-02-26 DIAGNOSIS — C187 Malignant neoplasm of sigmoid colon: Secondary | ICD-10-CM | POA: Diagnosis not present

## 2015-02-26 DIAGNOSIS — L659 Nonscarring hair loss, unspecified: Secondary | ICD-10-CM | POA: Insufficient documentation

## 2015-02-26 DIAGNOSIS — E559 Vitamin D deficiency, unspecified: Secondary | ICD-10-CM | POA: Diagnosis not present

## 2015-02-26 LAB — IBC PANEL
Iron: 117 ug/dL (ref 42–145)
Saturation Ratios: 35.7 % (ref 20.0–50.0)
Transferrin: 234 mg/dL (ref 212.0–360.0)

## 2015-02-26 LAB — BASIC METABOLIC PANEL
BUN: 10 mg/dL (ref 6–23)
CALCIUM: 10.3 mg/dL (ref 8.4–10.5)
CO2: 30 mEq/L (ref 19–32)
CREATININE: 0.74 mg/dL (ref 0.40–1.20)
Chloride: 103 mEq/L (ref 96–112)
GFR: 88.27 mL/min (ref >60.00)
Glucose, Bld: 87 mg/dL (ref 70–99)
Potassium: 4.2 mEq/L (ref 3.5–5.1)
SODIUM: 140 meq/L (ref 135–145)

## 2015-02-26 LAB — HEPATIC FUNCTION PANEL
ALT: 17 U/L (ref 0–35)
AST: 21 U/L (ref 0–37)
Albumin: 4.6 g/dL (ref 3.5–5.2)
Alkaline Phosphatase: 61 U/L (ref 39–117)
BILIRUBIN TOTAL: 0.7 mg/dL (ref 0.2–1.2)
Bilirubin, Direct: 0.2 mg/dL (ref 0.0–0.3)
Total Protein: 8 g/dL (ref 6.0–8.3)

## 2015-02-26 LAB — CBC WITH DIFFERENTIAL/PLATELET
Basophils Absolute: 0 10*3/uL (ref 0.0–0.1)
Basophils Relative: 0.4 % (ref 0.0–3.0)
EOS ABS: 0.1 10*3/uL (ref 0.0–0.7)
Eosinophils Relative: 2.3 % (ref 0.0–5.0)
HEMATOCRIT: 41.8 % (ref 36.0–46.0)
Hemoglobin: 14 g/dL (ref 12.0–15.0)
Lymphocytes Relative: 30.4 % (ref 12.0–46.0)
Lymphs Abs: 1.8 10*3/uL (ref 0.7–4.0)
MCHC: 33.6 g/dL (ref 30.0–36.0)
MCV: 90.4 fl (ref 78.0–100.0)
MONO ABS: 0.4 10*3/uL (ref 0.1–1.0)
MONOS PCT: 7.3 % (ref 3.0–12.0)
Neutro Abs: 3.4 10*3/uL (ref 1.4–7.7)
Neutrophils Relative %: 59.6 % (ref 43.0–77.0)
Platelets: 192 10*3/uL (ref 150.0–400.0)
RBC: 4.62 Mil/uL (ref 3.87–5.11)
RDW: 13.2 % (ref 11.5–15.5)
WBC: 5.8 10*3/uL (ref 4.0–10.5)

## 2015-02-26 LAB — VITAMIN B12: VITAMIN B 12: 719 pg/mL (ref 211–911)

## 2015-02-26 LAB — T4, FREE: Free T4: 0.98 ng/dL (ref 0.60–1.60)

## 2015-02-26 LAB — TSH: TSH: 2.35 u[IU]/mL (ref 0.35–4.50)

## 2015-02-26 LAB — VITAMIN D 25 HYDROXY (VIT D DEFICIENCY, FRACTURES): VITD: 39.03 ng/mL (ref 30.00–100.00)

## 2015-02-26 LAB — FOLLICLE STIMULATING HORMONE: FSH: 122.7 m[IU]/mL

## 2015-02-26 NOTE — Assessment & Plan Note (Signed)
HRT option discussed

## 2015-02-26 NOTE — Assessment & Plan Note (Signed)
Labs

## 2015-02-26 NOTE — Progress Notes (Signed)
Pre visit review using our clinic review tool, if applicable. No additional management support is needed unless otherwise documented below in the visit note. 

## 2015-02-26 NOTE — Patient Instructions (Addendum)
Can try soy, black cohosh, phitoestrogens Biotin

## 2015-02-26 NOTE — Assessment & Plan Note (Signed)
Seems to be doing well.

## 2015-02-26 NOTE — Progress Notes (Signed)
   Subjective:    HPI   C/o hair loss since jan 2016 LMP Nov 2017 C/o hot flashes F/u stress: mom had a CVA and died last fall 03-06-2014). 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  HENT: Negative for congestion, dental problem, ear pain, hearing loss, mouth sores, postnasal drip, sinus pressure, sneezing, sore throat and voice change.   Eyes: Negative for pain and visual disturbance.  Respiratory: Negative for cough, chest tightness, wheezing and stridor.   Cardiovascular: Negative for chest pain, palpitations and leg swelling.  Gastrointestinal: Positive for blood in stool. Negative for nausea, vomiting, abdominal pain, abdominal distention and rectal pain.  Genitourinary: Negative for dysuria, hematuria, decreased urine volume, vaginal bleeding, vaginal discharge, difficulty urinating, vaginal pain and menstrual problem.  Musculoskeletal: Negative for back pain, joint swelling, gait problem and neck pain.  Skin: Negative for color change, rash and wound.  Neurological: Negative for dizziness, tremors, syncope, speech difficulty, weakness and light-headedness.  Hematological: Negative for adenopathy.  Psychiatric/Behavioral: Negative for suicidal ideas, hallucinations, behavioral problems, confusion, sleep disturbance, dysphoric mood and decreased concentration. The patient is not hyperactive.    BP 112/82 mmHg  Pulse 71  Wt 134 lb (60.782 kg)  SpO2 98%     Objective:   Physical Exam  Constitutional: She appears well-developed. No distress.  HENT:  Head: Normocephalic.  Right Ear: External ear  normal.  Left Ear: External ear normal.  Nose: Nose normal.  Mouth/Throat: Oropharynx is clear and moist.  Eyes: Conjunctivae are normal. Pupils are equal, round, and reactive to light. Right eye exhibits no discharge. Left eye exhibits no discharge.  Neck: Normal range of motion. Neck supple. No JVD present. No tracheal deviation present. No thyromegaly present.  Cardiovascular: Normal rate, regular rhythm and normal heart sounds.   Pulmonary/Chest: No stridor. No respiratory distress. She has no wheezes.  Abdominal: Soft. Bowel sounds are normal. She exhibits no distension and no mass. There is no tenderness. There is no rebound and no guarding.  Musculoskeletal: She exhibits no edema or tenderness.  Lymphadenopathy:    She has no cervical adenopathy.  Neurological: She displays normal reflexes. No cranial nerve deficit. She exhibits normal muscle tone. Coordination normal.  Skin: No rash noted. No erythema.  Psychiatric: She has a normal mood and affect. Her behavior is normal. Judgment and thought content normal.   Lab Results  Component Value Date   WBC 8.2 10/16/2014   HGB 13.8 10/16/2014   HCT 40.8 10/16/2014   PLT 242.0 10/16/2014   GLUCOSE 84 10/16/2014   CHOL 186 11/14/2012   TRIG 71.0 11/14/2012   HDL 47.70 11/14/2012   LDLCALC 124* 11/14/2012   ALT 19 10/16/2014   AST 24 10/16/2014   NA 139 10/16/2014   K 3.8 10/16/2014   CL 105 10/16/2014   CREATININE 0.7 10/16/2014   BUN 10 10/16/2014   CO2 24 10/16/2014   TSH 1.23 10/16/2014   INR 0.94 05/23/2013          Assessment & Plan:

## 2015-03-02 LAB — VITAMIN B1: VITAMIN B1 (THIAMINE): 11 nmol/L (ref 8–30)

## 2015-03-23 LAB — HM MAMMOGRAPHY

## 2015-04-06 ENCOUNTER — Encounter: Payer: Self-pay | Admitting: Internal Medicine

## 2015-04-07 ENCOUNTER — Encounter (HOSPITAL_COMMUNITY): Payer: Self-pay

## 2015-04-07 ENCOUNTER — Other Ambulatory Visit (HOSPITAL_BASED_OUTPATIENT_CLINIC_OR_DEPARTMENT_OTHER): Payer: PRIVATE HEALTH INSURANCE

## 2015-04-07 ENCOUNTER — Ambulatory Visit (HOSPITAL_COMMUNITY)
Admission: RE | Admit: 2015-04-07 | Discharge: 2015-04-07 | Disposition: A | Discharge status: Home / Self Care | Payer: PRIVATE HEALTH INSURANCE | Source: Ambulatory Visit | Attending: Oncology | Admitting: Oncology

## 2015-04-07 DIAGNOSIS — C187 Malignant neoplasm of sigmoid colon: Secondary | ICD-10-CM

## 2015-04-07 DIAGNOSIS — C189 Malignant neoplasm of colon, unspecified: Secondary | ICD-10-CM | POA: Diagnosis present

## 2015-04-07 LAB — COMPREHENSIVE METABOLIC PANEL (CC13)
ALK PHOS: 58 U/L (ref 40–150)
ALT: 18 U/L (ref 0–55)
ANION GAP: 10 meq/L (ref 3–11)
AST: 20 U/L (ref 5–34)
Albumin: 4.1 g/dL (ref 3.5–5.0)
BILIRUBIN TOTAL: 1.27 mg/dL — AB (ref 0.20–1.20)
BUN: 7.7 mg/dL (ref 7.0–26.0)
CALCIUM: 9.5 mg/dL (ref 8.4–10.4)
CO2: 27 mEq/L (ref 22–29)
Chloride: 107 mEq/L (ref 98–109)
Creatinine: 0.8 mg/dL (ref 0.6–1.1)
EGFR: 89 mL/min/{1.73_m2} — ABNORMAL LOW (ref >90)
Glucose: 92 mg/dl (ref 70–140)
Potassium: 4 mEq/L (ref 3.5–5.1)
SODIUM: 144 meq/L (ref 136–145)
Total Protein: 7.3 g/dL (ref 6.4–8.3)

## 2015-04-07 LAB — CEA: CEA: <0.5 ng/mL (ref 0.0–5.0)

## 2015-04-07 MED ORDER — IOHEXOL 300 MG/ML  SOLN
100.0000 mL | Freq: Once | INTRAMUSCULAR | Status: AC | PRN
  Administered 2015-04-07: 100 mL via INTRAVENOUS

## 2015-04-09 ENCOUNTER — Ambulatory Visit (HOSPITAL_BASED_OUTPATIENT_CLINIC_OR_DEPARTMENT_OTHER): Payer: PRIVATE HEALTH INSURANCE | Admitting: Nurse Practitioner

## 2015-04-09 ENCOUNTER — Telehealth: Payer: Self-pay | Admitting: Oncology

## 2015-04-09 ENCOUNTER — Telehealth: Payer: Self-pay | Admitting: *Deleted

## 2015-04-09 VITALS — BP 98/65 | HR 73 | Temp 98.0°F | Resp 20 | Ht 65.0 in | Wt 135.9 lb

## 2015-04-09 DIAGNOSIS — D6959 Other secondary thrombocytopenia: Secondary | ICD-10-CM

## 2015-04-09 DIAGNOSIS — C187 Malignant neoplasm of sigmoid colon: Secondary | ICD-10-CM

## 2015-04-09 DIAGNOSIS — D509 Iron deficiency anemia, unspecified: Secondary | ICD-10-CM

## 2015-04-09 DIAGNOSIS — D701 Agranulocytosis secondary to cancer chemotherapy: Secondary | ICD-10-CM

## 2015-04-09 NOTE — Telephone Encounter (Signed)
Called [facility] to have patient's Mammogram faxed to our office.  Per Elby Showers. Marcello Moores, NP.

## 2015-04-09 NOTE — Telephone Encounter (Signed)
Gave and printed appt sched adn avs for pt for NOV °

## 2015-04-09 NOTE — Progress Notes (Signed)
  [facility] OFFICE PROGRESS NOTE   Diagnosis:  Colon cancer  INTERVAL HISTORY:   Ms. Corkern returns as scheduled. No change in bowel habits. No bloody or black stools. No pain with bowel movements. She occasionally has upper abdominal discomfort which is relieved with omeprazole. She reports a good appetite. She has persistent numbness in the feet. She reports a recent mammogram which was negative.  Objective:  Vital signs in last 24 hours:  Blood pressure 98/65, pulse 73, temperature 98 F (36.7 C), temperature source Oral, resp. rate 20, height 5' 5" (1.651 m), weight 135 lb 14.4 oz (61.644 kg), SpO2 100 %.    HEENT: No thrush or ulcers. Lymphatics: No palpable cervical, supra clavicular, right axillary or inguinal lymph nodes. Soft mobile 1 cm left axillary lymph node. Resp: Lungs clear bilaterally. Cardio: Regular rate and rhythm. GI: Abdomen soft and nontender. No hepatomegaly. No mass. Vascular: No leg edema. Calves soft and nontender.   Lab Results:  Lab Results  Component Value Date   WBC 5.8 02/26/2015   HGB 14.0 02/26/2015   HCT 41.8 02/26/2015   MCV 90.4 02/26/2015   PLT 192.0 02/26/2015   NEUTROABS 3.4 02/26/2015    Imaging:  No results found.  Medications: I have reviewed the patient's current medications.  Assessment/Plan: 1. 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 The tumor returned microsatellite stable with no loss of mismatch repair protein expression.  Cycle 1 adjuvant FOLFOX chemotherapy 05/28/2013.   Oxaliplatin was held beginning with cycle 8 due to neuropathy symptoms.   Final cycle of 5-fluorouracil/leucovorin given on 10/29/2013.  03/04/2014 CEA less than 0.5.  04/02/2014 CT chest/abdomen/pelvis negative for metastatic disease.  Colonoscopy 04/17/2014-negative  04/07/2015 CT chest/abdomen/pelvis negative for metastatic  disease 2. History of microcytic anemia.  3. Port-A-Cath placement 05/24/2013. 4. History of uterine fibroids. 5. Nausea following cycle 1 FOLFOX. Aloxi and prophylactic Decadron were added with cycle 2. She had nausea following cycle 2. Emend was added with cycle 3. Improved. No nausea following cycle 5 or 6. 6. Mild neutropenia secondary chemotherapy. She received Neulasta beginning with cycle 4 FOLFOX  7. Abdominal discomfort following cycle 4 FOLFOX. Question gastritis related to steroids. She began Prilosec 20 mg daily. 8. History of mild thrombocytopenia secondary chemotherapy. 9. Pruritus and erythema over the palms beginning day 4 following cycle 5 FOLFOX and lasting approximately 3 days. Question hand-foot syndrome related to 5-fluorouracil, question allergic reaction. She was instructed to contact the office if she experiences this again. 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 Persistent numbness in the feet 11. Soft mobile left axillary lymph node on exam 10/07/2014. Stable on exam 04/09/2015.   Disposition: Ms. Else appears stable. She remains in remission from colon cancer. She will return for a follow-up visit and CEA in 6 months.  The small palpable left axillary lymph node is stable and is likely a benign finding. There was no axillary adenopathy on the recent CT scan. She had a mammogram earlier this month which she reports was negative.  She will contact the office prior to her next visit with any problems.  Plan reviewed with Dr. Benay Spice.  Ned Card ANP/GNP-BC   04/09/2015  9:53 AM

## 2015-08-25 ENCOUNTER — Encounter: Payer: Self-pay | Admitting: Internal Medicine

## 2015-09-29 ENCOUNTER — Encounter: Payer: Self-pay | Admitting: Internal Medicine

## 2015-10-06 ENCOUNTER — Ambulatory Visit (HOSPITAL_BASED_OUTPATIENT_CLINIC_OR_DEPARTMENT_OTHER): Payer: PRIVATE HEALTH INSURANCE | Admitting: Oncology

## 2015-10-06 ENCOUNTER — Telehealth: Payer: Self-pay | Admitting: Oncology

## 2015-10-06 ENCOUNTER — Other Ambulatory Visit (HOSPITAL_BASED_OUTPATIENT_CLINIC_OR_DEPARTMENT_OTHER): Payer: PRIVATE HEALTH INSURANCE

## 2015-10-06 VITALS — BP 105/78 | HR 71 | Temp 98.1°F | Resp 18 | Ht 65.0 in | Wt 137.0 lb

## 2015-10-06 DIAGNOSIS — D509 Iron deficiency anemia, unspecified: Secondary | ICD-10-CM

## 2015-10-06 DIAGNOSIS — Z85038 Personal history of other malignant neoplasm of large intestine: Secondary | ICD-10-CM | POA: Diagnosis not present

## 2015-10-06 DIAGNOSIS — C187 Malignant neoplasm of sigmoid colon: Secondary | ICD-10-CM

## 2015-10-06 DIAGNOSIS — G620 Drug-induced polyneuropathy: Secondary | ICD-10-CM | POA: Diagnosis not present

## 2015-10-06 NOTE — Progress Notes (Signed)
  [facility] OFFICE PROGRESS NOTE   Diagnosis: Colon cancer  INTERVAL HISTORY:   Priscilla Hill returns as scheduled. She feels well. She is being treated with omeprazole for "gastritis ". This has helped. Good appetite. She has persistent numbness in the toes. This does not interfere with activity.  Objective:  Vital signs in last 24 hours:  Blood pressure 105/78, pulse 71, temperature 98.1 F (36.7 C), temperature source Oral, resp. rate 18, height $RemoveBe'5\' 5"'ShoMaSXUV$  (1.651 m), weight 137 lb (62.143 kg), SpO2 94 %.    HEENT: Neck without mass Lymphatics: No cervical, supra-clavicular, axillary, or inguinal nodes Resp: Lungs clear bilaterally with a slight decrease in breath sounds at the right lower chest, no respiratory distress Cardio: Regular rate and rhythm GI: No hepatomegaly, nontender, no mass Vascular: No leg edema   Lab Results:   Lab Results  Component Value Date   CEA <0.5 04/07/2015    Medications: I have reviewed the patient's current medications.  Assessment/Plan: 1. 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 The tumor returned microsatellite stable with no loss of mismatch repair protein expression.  Cycle 1 adjuvant FOLFOX chemotherapy 05/28/2013.   Oxaliplatin was held beginning with cycle 8 due to neuropathy symptoms.   Final cycle of 5-fluorouracil/leucovorin given on 10/29/2013.  03/04/2014 CEA less than 0.5.  04/02/2014 CT chest/abdomen/pelvis negative for metastatic disease.  Colonoscopy 04/17/2014-negative  04/07/2015 CT chest/abdomen/pelvis negative for metastatic disease 2. History of microcytic anemia.  3. Port-A-Cath placement 05/24/2013. 4. History of uterine fibroids. 5. Nausea following cycle 1 FOLFOX. Aloxi and prophylactic Decadron were added with cycle 2. She had nausea following cycle 2. Emend was added with cycle 3. Improved. No nausea following  cycle 5 or 6. 6. Mild neutropenia secondary chemotherapy. She received Neulasta beginning with cycle 4 FOLFOX  7. Abdominal discomfort following cycle 4 FOLFOX. Question gastritis related to steroids. She began Prilosec 20 mg daily. 8. History of mild thrombocytopenia secondary chemotherapy. 9. Pruritus and erythema over the palms beginning day 4 following cycle 5 FOLFOX and lasting approximately 3 days. Question hand-foot syndrome related to 5-fluorouracil, question allergic reaction. She was instructed to contact the office if she experiences this again. 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 Persistent numbness in the feet 11. Soft mobile left axillary lymph node on exam 10/07/2014. Stable on exam 04/09/2015. Not noted on exam 10/06/2015    Disposition:  Priscilla Hill remains in clinical remission from colon cancer. We will follow-up on the CEA from today. She will return for an office visit and CEA in 6 months. She will be scheduled for a restaging CT evaluation prior to the 6 month visit.  Betsy Coder, MD  10/06/2015  10:06 AM

## 2015-10-06 NOTE — Telephone Encounter (Signed)
Gave and printed appt sched and avs fo rpt for May 2017 °

## 2015-10-07 ENCOUNTER — Telehealth: Payer: Self-pay

## 2015-10-07 LAB — CEA: CEA: <0.5 ng/mL (ref 0.0–5.0)

## 2015-10-07 NOTE — Telephone Encounter (Signed)
-----   Message from Owens Shark, NP sent at 10/07/2015  8:51 AM EST ----- Please let her know CEA is normal.

## 2015-10-07 NOTE — Telephone Encounter (Signed)
As per Owens Shark call placed to Ms. Grosshans to inform her that  CEA results were normal. Left message to return call to [facility].

## 2015-10-07 NOTE — Telephone Encounter (Signed)
Priscilla Hill returned call relayed message CEA results were normal

## 2015-10-13 ENCOUNTER — Encounter: Payer: Self-pay | Admitting: Internal Medicine

## 2015-10-13 ENCOUNTER — Ambulatory Visit (INDEPENDENT_AMBULATORY_CARE_PROVIDER_SITE_OTHER): Payer: PRIVATE HEALTH INSURANCE | Admitting: Internal Medicine

## 2015-10-13 VITALS — BP 118/84 | HR 76 | Temp 98.9°F | Wt 135.0 lb

## 2015-10-13 DIAGNOSIS — A048 Other specified bacterial intestinal infections: Secondary | ICD-10-CM

## 2015-10-13 DIAGNOSIS — E559 Vitamin D deficiency, unspecified: Secondary | ICD-10-CM | POA: Diagnosis not present

## 2015-10-13 DIAGNOSIS — Z0000 Encounter for general adult medical examination without abnormal findings: Secondary | ICD-10-CM | POA: Diagnosis not present

## 2015-10-13 DIAGNOSIS — B9681 Helicobacter pylori [H. pylori] as the cause of diseases classified elsewhere: Secondary | ICD-10-CM | POA: Diagnosis not present

## 2015-10-13 DIAGNOSIS — K2970 Gastritis, unspecified, without bleeding: Secondary | ICD-10-CM | POA: Diagnosis not present

## 2015-10-13 DIAGNOSIS — K2990 Gastroduodenitis, unspecified, without bleeding: Secondary | ICD-10-CM

## 2015-10-13 MED ORDER — OMEPRAZOLE 40 MG PO CPDR: 40.0000 mg | DELAYED_RELEASE_CAPSULE | Freq: Every day | ORAL | Status: DC

## 2015-10-13 MED ORDER — RANITIDINE HCL 300 MG PO TABS: 300.0000 mg | ORAL_TABLET | Freq: Every day | ORAL | Status: DC

## 2015-10-13 NOTE — Assessment & Plan Note (Signed)
11/16 H pylori (+) in 2015 Omeprazole 40 mg q am and Ranitidine 300 mg q hs x 6 weeks GI ref

## 2015-10-13 NOTE — Progress Notes (Signed)
Pre visit review using our clinic review tool, if applicable. No additional management support is needed unless otherwise documented below in the visit note. 

## 2015-10-13 NOTE — Progress Notes (Signed)
Subjective:  Patient ID: Priscilla Hill, female    DOB: [birth year]  Age: 49 y.o. MRN: [id number]  CC: Abdominal Pain and Ear Pain   HPI Priscilla Hill presents for epigastric abd pain x <1 mo, better on 40 mg Omeprazole and Ranitidine 300 mg that Jiles Garter started 1 week ago  Outpatient Prescriptions Prior to Visit  Medication Sig Dispense Refill  . b complex vitamins tablet Take 1 tablet by mouth daily.    Marland Kitchen BLACK COHOSH PO Take by mouth. For hot flashes    . cholecalciferol (VITAMIN D) 1000 UNITS tablet Take 1 tablet (1,000 Units total) by mouth daily. 100 tablet 3  . omeprazole (PRILOSEC) 20 MG capsule Take 20 mg by mouth daily.     No facility-administered medications prior to visit.    ROS Review of Systems  Constitutional: Negative for chills, activity change, appetite change, fatigue and unexpected weight change.  HENT: Negative for congestion, mouth sores and sinus pressure.   Eyes: Negative for visual disturbance.  Respiratory: Negative for cough and chest tightness.   Gastrointestinal: Positive for abdominal pain. Negative for nausea, diarrhea and abdominal distention.  Genitourinary: Negative for frequency, difficulty urinating and vaginal pain.  Musculoskeletal: Negative for back pain and gait problem.  Skin: Negative for pallor and rash.  Neurological: Negative for dizziness, tremors, weakness, numbness and headaches.  Psychiatric/Behavioral: Negative for confusion and sleep disturbance.    Objective:  BP 118/84 mmHg  Pulse 76  Temp(Src) 98.9 F (37.2 C) (Oral)  Wt 135 lb (61.236 kg)  SpO2 97%  BP Readings from Last 3 Encounters:  10/13/15 118/84  10/06/15 105/78  04/09/15 98/65    Wt Readings from Last 3 Encounters:  10/13/15 135 lb (61.236 kg)  10/06/15 137 lb (62.143 kg)  04/09/15 135 lb 14.4 oz (61.644 kg)    Physical Exam  Constitutional: She appears well-developed. No distress.  HENT:  Head: Normocephalic.  Right Ear: External ear normal.  Left  Ear: External ear normal.  Nose: Nose normal.  Mouth/Throat: Oropharynx is clear and moist.  Eyes: Conjunctivae are normal. Pupils are equal, round, and reactive to light. Right eye exhibits no discharge. Left eye exhibits no discharge.  Neck: Normal range of motion. Neck supple. No JVD present. No tracheal deviation present. No thyromegaly present.  Cardiovascular: Normal rate, regular rhythm and normal heart sounds.   Pulmonary/Chest: No stridor. No respiratory distress. She has no wheezes.  Abdominal: Soft. Bowel sounds are normal. She exhibits no distension and no mass. There is no tenderness. There is no rebound and no guarding.  Musculoskeletal: She exhibits no edema or tenderness.  Lymphadenopathy:    She has no cervical adenopathy.  Neurological: She displays normal reflexes. No cranial nerve deficit. She exhibits normal muscle tone. Coordination normal.  Skin: No rash noted. No erythema.  Psychiatric: She has a normal mood and affect. Her behavior is normal. Judgment and thought content normal.    Lab Results  Component Value Date   WBC 5.8 02/26/2015   HGB 14.0 02/26/2015   HCT 41.8 02/26/2015   PLT 192.0 02/26/2015   GLUCOSE 92 04/07/2015   CHOL 186 11/14/2012   TRIG 71.0 11/14/2012   HDL 47.70 11/14/2012   LDLCALC 124* 11/14/2012   ALT 18 04/07/2015   AST 20 04/07/2015   NA 144 04/07/2015   K 4.0 04/07/2015   CL 103 02/26/2015   CREATININE 0.8 04/07/2015   BUN 7.7 04/07/2015   CO2 27 04/07/2015   TSH 2.35  02/26/2015   INR 0.94 05/23/2013    Ct Chest W Contrast  04/07/2015  CLINICAL DATA:  Subsequent encounter for colon cancer EXAM: CT CHEST, ABDOMEN, AND PELVIS WITH CONTRAST TECHNIQUE: Multidetector CT imaging of the chest, abdomen and pelvis was performed following the standard protocol during bolus administration of intravenous contrast. CONTRAST:  164mL OMNIPAQUE IOHEXOL 300 MG/ML  SOLN COMPARISON:  04/02/2014. FINDINGS: CT CHEST FINDINGS Mediastinum/Nodes:  There is no axillary lymphadenopathy. There is no mediastinal lymphadenopathy. No hilar lymphadenopathy. 8-9 mm short axis right hilar lymph node is stable. The heart size is normal. No pericardial effusion. Lungs/Pleura: No focal airspace consolidation. No pulmonary nodule or mass. No pleural effusion. Musculoskeletal: Bone windows reveal no worrisome lytic or sclerotic osseous lesions. CT ABDOMEN AND PELVIS FINDINGS Hepatobiliary: No focal abnormality within the liver parenchyma. There is no evidence for gallstones, gallbladder wall thickening, or pericholecystic fluid. No intrahepatic or extrahepatic biliary dilation. Pancreas: No focal mass lesion. No dilatation of the main duct. No intraparenchymal cyst. No peripancreatic edema. Spleen: No splenomegaly. No focal mass lesion. Adrenals/Urinary Tract: No adrenal nodule or mass. No evidence for renal mass. No hydronephrosis. Bladder is unremarkable. Stomach/Bowel: Stomach is nondistended. No gastric wall thickening. No evidence of outlet obstruction. Duodenum is normally positioned as is the ligament of Treitz. No small bowel wall thickening. No small bowel dilatation. Terminal ileum is normal. The appendix is normal. Mid sigmoid anastomosis is evident with some redundant colonic mucosa at the anastomosis. Colon otherwise unremarkable in appearance. Vascular/Lymphatic: No abdominal aortic aneurysm. No abdominal lymphadenopathy. No pelvic sidewall lymphadenopathy. Reproductive: Uterus is prominent with evidence of fibroid change. No adnexal mass. Other: No intraperitoneal free fluid. Musculoskeletal: Bone windows reveal no worrisome lytic or sclerotic osseous lesions. IMPRESSION: Stable exam. No new or progressive findings to suggest metastatic disease. Electronically Signed   By: Misty Stanley M.D.   On: 04/07/2015 10:18   Ct Abdomen Pelvis W Contrast  04/07/2015  CLINICAL DATA:  Subsequent encounter for colon cancer EXAM: CT CHEST, ABDOMEN, AND PELVIS WITH  CONTRAST TECHNIQUE: Multidetector CT imaging of the chest, abdomen and pelvis was performed following the standard protocol during bolus administration of intravenous contrast. CONTRAST:  15mL OMNIPAQUE IOHEXOL 300 MG/ML  SOLN COMPARISON:  04/02/2014. FINDINGS: CT CHEST FINDINGS Mediastinum/Nodes: There is no axillary lymphadenopathy. There is no mediastinal lymphadenopathy. No hilar lymphadenopathy. 8-9 mm short axis right hilar lymph node is stable. The heart size is normal. No pericardial effusion. Lungs/Pleura: No focal airspace consolidation. No pulmonary nodule or mass. No pleural effusion. Musculoskeletal: Bone windows reveal no worrisome lytic or sclerotic osseous lesions. CT ABDOMEN AND PELVIS FINDINGS Hepatobiliary: No focal abnormality within the liver parenchyma. There is no evidence for gallstones, gallbladder wall thickening, or pericholecystic fluid. No intrahepatic or extrahepatic biliary dilation. Pancreas: No focal mass lesion. No dilatation of the main duct. No intraparenchymal cyst. No peripancreatic edema. Spleen: No splenomegaly. No focal mass lesion. Adrenals/Urinary Tract: No adrenal nodule or mass. No evidence for renal mass. No hydronephrosis. Bladder is unremarkable. Stomach/Bowel: Stomach is nondistended. No gastric wall thickening. No evidence of outlet obstruction. Duodenum is normally positioned as is the ligament of Treitz. No small bowel wall thickening. No small bowel dilatation. Terminal ileum is normal. The appendix is normal. Mid sigmoid anastomosis is evident with some redundant colonic mucosa at the anastomosis. Colon otherwise unremarkable in appearance. Vascular/Lymphatic: No abdominal aortic aneurysm. No abdominal lymphadenopathy. No pelvic sidewall lymphadenopathy. Reproductive: Uterus is prominent with evidence of fibroid change. No adnexal mass. Other: No  intraperitoneal free fluid. Musculoskeletal: Bone windows reveal no worrisome lytic or sclerotic osseous lesions.  IMPRESSION: Stable exam. No new or progressive findings to suggest metastatic disease. Electronically Signed   By: Misty Stanley M.D.   On: 04/07/2015 10:18    Assessment & Plan:   Maliyah was seen today for abdominal pain and ear pain.  Diagnoses and all orders for this visit:  Gastritis and gastroduodenitis -     Ambulatory referral to Gastroenterology  Well adult exam -     TSH; Future -     CBC with Differential/Platelet; Future -     Basic metabolic panel; Future -     Hepatic function panel; Future -     Lipid panel; Future -     Urinalysis; Future  Other orders -     ranitidine (ZANTAC) 300 MG tablet; Take 1 tablet (300 mg total) by mouth at bedtime. -     omeprazole (PRILOSEC) 40 MG capsule; Take 1 capsule (40 mg total) by mouth daily.   I have discontinued Ms. Lutter's omeprazole. I am also having her start on ranitidine and omeprazole. Additionally, I am having her maintain her cholecalciferol, BLACK COHOSH PO, and b complex vitamins.  Meds ordered this encounter  Medications  . ranitidine (ZANTAC) 300 MG tablet    Sig: Take 1 tablet (300 mg total) by mouth at bedtime.    Dispense:  30 tablet    Refill:  3  . omeprazole (PRILOSEC) 40 MG capsule    Sig: Take 1 capsule (40 mg total) by mouth daily.    Dispense:  30 capsule    Refill:  3     Follow-up: Return in about 3 months (around 01/13/2016) for Wellness Exam.  Walker Kehr, MD

## 2015-10-13 NOTE — Assessment & Plan Note (Signed)
Treated in 11/15

## 2015-10-13 NOTE — Assessment & Plan Note (Signed)
Take vit D

## 2015-11-12 ENCOUNTER — Encounter: Payer: Self-pay | Admitting: Internal Medicine

## 2015-11-12 ENCOUNTER — Ambulatory Visit (INDEPENDENT_AMBULATORY_CARE_PROVIDER_SITE_OTHER): Payer: PRIVATE HEALTH INSURANCE | Admitting: Internal Medicine

## 2015-11-12 VITALS — BP 104/74 | HR 96 | Temp 99.4°F | Ht 66.0 in | Wt 133.0 lb

## 2015-11-12 DIAGNOSIS — J019 Acute sinusitis, unspecified: Secondary | ICD-10-CM | POA: Diagnosis not present

## 2015-11-12 MED ORDER — HYDROCODONE-HOMATROPINE 5-1.5 MG/5ML PO SYRP: 5.0000 mL | ORAL_SOLUTION | Freq: Four times a day (QID) | ORAL | Status: DC | PRN

## 2015-11-12 MED ORDER — LEVOFLOXACIN 500 MG PO TABS: 500.0000 mg | ORAL_TABLET | Freq: Every day | ORAL | Status: DC

## 2015-11-12 NOTE — Progress Notes (Signed)
Pre visit review using our clinic review tool, if applicable. No additional management support is needed unless otherwise documented below in the visit note. 

## 2015-11-12 NOTE — Patient Instructions (Signed)
Please take all new medication as prescribed - the antibiotic, and cough medicine if needed  Please continue all other medications as before, and refills have been done if requested.  Please have the pharmacy call with any other refills you may need.  Please keep your appointments with your specialists as you may have planned     

## 2015-11-15 NOTE — Assessment & Plan Note (Signed)
Mild to mod, for antibx course,  to f/u any worsening symptoms or concerns 

## 2015-11-15 NOTE — Progress Notes (Signed)
   Subjective:    Patient ID: Priscilla Hill, female    DOB: 1966-02-12, 49 y.o.   MRN: [id number]  HPI   Here with 2-3 days acute onset fever, facial pain, pressure, headache, general weakness and malaise, and greenish d/c, with mild ST and cough, but pt denies chest pain, wheezing, increased sob or doe, orthopnea, PND, increased LE swelling, palpitations, dizziness or syncope.   Pt denies polydipsia, polyuria, Past Medical History  Diagnosis Date  . Hyperlipidemia     mild  . GERD (gastroesophageal reflux disease)   . History of positive PPD     never any symptoms, due to being from [place]? different "vaccine"  . Anxiety   . Anemia   . Cancer [facility])    Past Surgical History  Procedure Laterality Date  . Essure tubal ligation    . Childbirth   04-17-13    x 2 NVD  . Hysteroscopy with resectoscope  04-17-13    "fibroids removed"  . Laparoscopic partial colectomy N/A 04/24/2013    Procedure: LAPAROSCOPIC ASSISTED SIGMOID COLECTOMY;  Surgeon: Odis Hollingshead, MD;  Location: [facility];  Service: General;  Laterality: N/A;  . Portacath placement Right 05/24/2013    Procedure: INSERTION PORT-A-CATH;  Surgeon: Odis Hollingshead, MD;  Location: [facility];  Service: General;  Laterality: Right;    reports that she has never smoked. She has never used smokeless tobacco. She reports that she drinks alcohol. She reports that she does not use illicit drugs. family history includes Diabetes in her mother; Heart disease in her maternal grandfather, maternal grandmother, and mother; Hypertension in her father and mother; Stroke (age of onset: 37) in her father. There is no history of Colon cancer. No Known Allergies Current Outpatient Prescriptions on File Prior to Visit  Medication Sig Dispense Refill  . b complex vitamins tablet Take 1 tablet by mouth daily.    Marland Kitchen BLACK COHOSH PO Take by mouth. For hot flashes    . omeprazole (PRILOSEC) 40 MG capsule Take 1 capsule (40 mg total) by mouth daily. 30 capsule 3    . ranitidine (ZANTAC) 300 MG tablet Take 1 tablet (300 mg total) by mouth at bedtime. 30 tablet 3   No current facility-administered medications on file prior to visit.    Review of Systems All otherwise neg per pt     Objective:   Physical Exam BP 104/74 mmHg  Pulse 96  Temp(Src) 99.4 F (37.4 C) (Oral)  Ht 5\' 6"  (1.676 m)  Wt 133 lb (60.328 kg)  BMI 21.48 kg/m2  SpO2 95% VS noted,  Constitutional: Pt appears in no significant distress HENT: Head: NCAT.  Right Ear: External ear normal.  Left Ear: External ear normal.  Bilat tm's with mild erythema.  Max sinus areas mild tender.  Pharynx with mild erythema, no exudate Eyes: . Pupils are equal, round, and reactive to light. Conjunctivae and EOM are normal Neck: Normal range of motion. Neck supple.  Cardiovascular: Normal rate and regular rhythm.   Pulmonary/Chest: Effort normal and breath sounds without rales or wheezing.  Neurological: Pt is alert. Not confused , motor grossly intact Skin: Skin is warm. No rash, no LE edema Psychiatric: Pt behavior is normal. No agitation.     Assessment & Plan:

## 2015-12-22 ENCOUNTER — Encounter: Payer: Self-pay | Admitting: Gastroenterology

## 2015-12-22 ENCOUNTER — Ambulatory Visit (INDEPENDENT_AMBULATORY_CARE_PROVIDER_SITE_OTHER): Payer: PRIVATE HEALTH INSURANCE | Admitting: Gastroenterology

## 2015-12-22 VITALS — BP 100/60 | HR 76 | Ht 65.25 in | Wt 134.5 lb

## 2015-12-22 DIAGNOSIS — R1013 Epigastric pain: Secondary | ICD-10-CM | POA: Diagnosis not present

## 2015-12-22 DIAGNOSIS — R110 Nausea: Secondary | ICD-10-CM | POA: Diagnosis not present

## 2015-12-22 DIAGNOSIS — Z85038 Personal history of other malignant neoplasm of large intestine: Secondary | ICD-10-CM | POA: Diagnosis not present

## 2015-12-22 NOTE — Patient Instructions (Signed)

## 2015-12-22 NOTE — Progress Notes (Signed)
HPI :  50 y/o female with a history of colon cancer and H pylori, here for follow up. Previously followed by Dr. Olevia Perches.  Patient reports in this past summer she was having "stomach problems". She was having epigastric discomfort and postprandial pain, with decreased appetite. She denied any reflux symptoms at the time. She denies any pyrosis or regurgitation. No dysphagia. She was given omeprazole 40mg  for a short period of time and had some benefit. However in October / November her symptoms recurred and were worse, she took omeprazole again which did not help too much, but she did have some mild relief. She reports over time she is feeling somewhat better since these symptoms began in recent months. In 2015 she had therapy for H pylori for a few weeks. She does not recall if she had symptoms at the time, she thinks she may have. IShe thinks when she is stressed her stomch bothers her. She reports she continues to have some mild symptoms of epigastric pain, she takes omeprazole PRN at this time. She feels it most in the morning. She is not sure if she feels worse after eating, maybe slightly. She has not had any vomiting, but she has had some nausea. No nausea at present time. She thinks she had lost 2-3 lbs due to symptoms as she was eating less.   Her uncle had gastric cancer. She has not had a prior upper endoscopy. She takes periodic OTC regimen for headaches, sounds like excedrin, she takes it once per month. No routine NSAID use.  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 Her last colonoscopy in 2015 was normal and she and she has been in remission since that time.   Colonoscopy 03/2013 - sigmoid adenocarcinoma Colonoscopy 03/2014 - normal, no adenomas   Past Medical History  Diagnosis Date  . Hyperlipidemia     mild  . GERD (gastroesophageal reflux disease)   .  History of positive PPD     never any symptoms, due to being from [place]? different "vaccine"  . Anxiety   . Anemia   . Cancer ([place])   . H. pylori infection     positive IgG serology    Past Surgical History  Procedure Laterality Date  . Essure tubal ligation    . Childbirth   04-17-13    x 2 NVD  . Hysteroscopy with resectoscope  04-17-13    "fibroids removed"  . Laparoscopic partial colectomy N/A 04/24/2013    Procedure: LAPAROSCOPIC ASSISTED SIGMOID COLECTOMY;  Surgeon: Odis Hollingshead, MD;  Location: [facility];  Service: General;  Laterality: N/A;  . Portacath placement Right 05/24/2013    Procedure: INSERTION PORT-A-CATH;  Surgeon: Odis Hollingshead, MD;  Location: [facility];  Service: General;  Laterality: Right;   Family History  Problem Relation Age of Onset  . Hypertension Mother   . Diabetes Mother   . Heart disease Mother   . Stroke Father 13  . Hypertension Father   . Heart disease Maternal Grandmother   . Heart disease Maternal Grandfather   . Stomach cancer      grandmother  . Colon cancer Neg Hx    Social History  Substance Use Topics  . Smoking status: Never Smoker   . Smokeless tobacco: Never Used  . Alcohol Use: Yes     Comment: occasionally   Current Outpatient Prescriptions  Medication  Sig Dispense Refill  . b complex vitamins tablet Take 1 tablet by mouth daily.    Marland Kitchen VITAMIN D, CHOLECALCIFEROL, PO Take 1 tablet by mouth daily.     No current facility-administered medications for this visit.   No Known Allergies   Review of Systems: All systems reviewed and negative except where noted in HPI.   History of (+) H pylori IgG in 2015  Lab Results  Component Value Date   WBC 5.8 02/26/2015   HGB 14.0 02/26/2015   HCT 41.8 02/26/2015   MCV 90.4 02/26/2015   PLT 192.0 02/26/2015    Lab Results  Component Value Date   CREATININE 0.8 04/07/2015   BUN 7.7 04/07/2015   NA 144 04/07/2015   K 4.0 04/07/2015   CL 103 02/26/2015   CO2 27 04/07/2015     Lab Results  Component Value Date   ALT 18 04/07/2015   AST 20 04/07/2015   ALKPHOS 58 04/07/2015   BILITOT 1.27* 04/07/2015   CT scan C/A/P negative for metastatic disease 2015  Physical Exam: BP 100/60 mmHg  Pulse 76  Ht 5' 5.25" (1.657 m)  Wt 134 lb 8 oz (61.009 kg)  BMI 22.22 kg/m2 Constitutional: Pleasant,well-developed, female in no acute distress. HEENT: Normocephalic and atraumatic. Conjunctivae are normal. No scleral icterus. Neck supple.  Cardiovascular: Normal rate, regular rhythm.  Pulmonary/chest: Effort normal and breath sounds normal. No wheezing, rales or rhonchi. Abdominal: Soft, nondistended, nontender. Bowel sounds active throughout. There are no masses palpable. No hepatomegaly. Extremities: no edema Lymphadenopathy: No cervical adenopathy noted. Neurological: Alert and oriented to person place and time. Skin: Skin is warm and dry. No rashes noted. Psychiatric: Normal mood and affect. Behavior is normal.   ASSESSMENT AND PLAN: 50 y/o female with a history of colon cancer and history of (+) H pylori IgG serology s/p treatment in 2015, presenting with intermittent epigastric pain and nausea.   Epigastric pain / nausea - improved with PPI but has some symptoms to a certain extent which have persisted. She could have persistent or recurrence of H pylori, vs. PUD, vs. Other. CT C/A/P in 2015 was negative for metastatic disease. Given her ongoing intermittent symptoms and age, recommend an EGD to further evaluate. The indications, risks, and benefits of EGD were explained to the patient in detail. Risks include but are not limited to bleeding, perforation, adverse reaction to medications, and cardiopulmonary compromise. Sequelae include but are not limited to the possibility of surgery, hospitalization, and mortality. The patient verbalized understanding and wished to proceed. All questions answered, referred to scheduler. Further recommendations pending results of  the exam.   H/o colon cancer in 2014 - s/p resection and chemotherapy, currently in remission. Last colonoscopy in 2015 was normal. Due for recall colonoscopy May 2018, she agreed.   Loma Mar Cellar, MD [facility] Pager [phone]  CC:  Plotnikov, Evie Lacks, MD

## 2015-12-25 ENCOUNTER — Other Ambulatory Visit: Payer: Self-pay

## 2015-12-25 DIAGNOSIS — R1013 Epigastric pain: Secondary | ICD-10-CM

## 2015-12-25 DIAGNOSIS — Z8619 Personal history of other infectious and parasitic diseases: Secondary | ICD-10-CM

## 2016-01-09 ENCOUNTER — Encounter: Payer: Self-pay | Admitting: Gastroenterology

## 2016-01-09 ENCOUNTER — Ambulatory Visit (AMBULATORY_SURGERY_CENTER): Payer: PRIVATE HEALTH INSURANCE | Admitting: Gastroenterology

## 2016-01-09 VITALS — BP 116/80 | HR 58 | Temp 98.2°F | Resp 13 | Ht 65.25 in | Wt 134.0 lb

## 2016-01-09 DIAGNOSIS — K2970 Gastritis, unspecified, without bleeding: Secondary | ICD-10-CM | POA: Diagnosis not present

## 2016-01-09 DIAGNOSIS — R1013 Epigastric pain: Secondary | ICD-10-CM

## 2016-01-09 MED ORDER — SODIUM CHLORIDE 0.9 % IV SOLN: 500.0000 mL | INTRAVENOUS | Status: DC

## 2016-01-09 NOTE — Progress Notes (Signed)
Stable to RR 

## 2016-01-09 NOTE — Patient Instructions (Signed)
YOU HAD AN ENDOSCOPIC PROCEDURE TODAY AT THE [facility]:   Refer to the procedure report that was given to you for any specific questions about what was found during the examination.  If the procedure report does not answer your questions, please call your gastroenterologist to clarify.  If you requested that your care partner not be given the details of your procedure findings, then the procedure report has been included in a sealed envelope for you to review at your convenience later.  YOU SHOULD EXPECT: Some feelings of bloating in the abdomen. Passage of more gas than usual.  Walking can help get rid of the air that was put into your GI tract during the procedure and reduce the bloating. If you had a lower endoscopy (such as a colonoscopy or flexible sigmoidoscopy) you may notice spotting of blood in your stool or on the toilet paper. If you underwent a bowel prep for your procedure, you may not have a normal bowel movement for a few days.  Please Note:  You might notice some irritation and congestion in your nose or some drainage.  This is from the oxygen used during your procedure.  There is no need for concern and it should clear up in a day or so.  SYMPTOMS TO REPORT IMMEDIATELY:   Following lower endoscopy (colonoscopy or flexible sigmoidoscopy):  Excessive amounts of blood in the stool  Significant tenderness or worsening of abdominal pains  Swelling of the abdomen that is new, acute  Fever of 100F or higher   Following upper endoscopy (EGD)  Vomiting of blood or coffee ground material  New chest pain or pain under the shoulder blades  Painful or persistently difficult swallowing  New shortness of breath  Fever of 100F or higher  Black, tarry-looking stools  For urgent or emergent issues, a gastroenterologist can be reached at any hour by calling [phone].   DIET: Your first meal following the procedure should be a small meal and then it is ok to progress to  your normal diet. Heavy or fried foods are harder to digest and may make you feel nauseous or bloated.  Likewise, meals heavy in dairy and vegetables can increase bloating.  Drink plenty of fluids but you should avoid alcoholic beverages for 24 hours.  ACTIVITY:  You should plan to take it easy for the rest of today and you should NOT DRIVE or use heavy machinery until tomorrow (because of the sedation medicines used during the test).    FOLLOW UP: Our staff will call the number listed on your records the next business day following your procedure to check on you and address any questions or concerns that you may have regarding the information given to you following your procedure. If we do not reach you, we will leave a message.  However, if you are feeling well and you are not experiencing any problems, there is no need to return our call.  We will assume that you have returned to your regular daily activities without incident.  If any biopsies were taken you will be contacted by phone or by letter within the next 1-3 weeks.  Please call us at [phone] if you have not heard about the biopsies in 3 weeks.    SIGNATURES/CONFIDENTIALITY: You and/or your care partner have signed paperwork which will be entered into your electronic medical record.  These signatures attest to the fact that that the information above on your After Visit Summary has been reviewed   and is understood.  Full responsibility of the confidentiality of this discharge information lies with you and/or your care-partner.YOU HAD AN ENDOSCOPIC PROCEDURE TODAY AT [facility]:   Refer to the procedure report that was given to you for any specific questions about what was found during the examination.  If the procedure report does not answer your questions, please call your gastroenterologist to clarify.  If you requested that your care partner not be given the details of your procedure findings, then the procedure  report has been included in a sealed envelope for you to review at your convenience later.  YOU SHOULD EXPECT: Some feelings of bloating in the abdomen. Passage of more gas than usual.  Walking can help get rid of the air that was put into your GI tract during the procedure and reduce the bloating. If you had a lower endoscopy (such as a colonoscopy or flexible sigmoidoscopy) you may notice spotting of blood in your stool or on the toilet paper. If you underwent a bowel prep for your procedure, you may not have a normal bowel movement for a few days.  Please Note:  You might notice some irritation and congestion in your nose or some drainage.  This is from the oxygen used during your procedure.  There is no need for concern and it should clear up in a day or so.  SYMPTOMS TO REPORT IMMEDIATELY:  Following upper endoscopy (EGD)  Vomiting of blood or coffee ground material  New chest pain or pain under the shoulder blades  Painful or persistently difficult swallowing  New shortness of breath  Fever of 100F or higher  Black, tarry-looking stools  For urgent or emergent issues, a gastroenterologist can be reached at any hour by calling [phone].   DIET: Your first meal following the procedure should be a small meal and then it is ok to progress to your normal diet. Heavy or fried foods are harder to digest and may make you feel nauseous or bloated.  Likewise, meals heavy in dairy and vegetables can increase bloating.  Drink plenty of fluids but you should avoid alcoholic beverages for 24 hours.  ACTIVITY:  You should plan to take it easy for the rest of today and you should NOT DRIVE or use heavy machinery until tomorrow (because of the sedation medicines used during the test).    FOLLOW UP: Our staff will call the number listed on your records the next business day following your procedure to check on you and address any questions or concerns that you may have regarding the information  given to you following your procedure. If we do not reach you, we will leave a message.  However, if you are feeling well and you are not experiencing any problems, there is no need to return our call.  We will assume that you have returned to your regular daily activities without incident.  If any biopsies were taken you will be contacted by phone or by letter within the next 1-3 weeks.  Please call us at [phone] if you have not heard about the biopsies in 3 weeks.    SIGNATURES/CONFIDENTIALITY: You and/or your care partner have signed paperwork which will be entered into your electronic medical record.  These signatures attest to the fact that that the information above on your After Visit Summary has been reviewed and is understood.  Full responsibility of the confidentiality of this discharge information lies with you and/or your care-partner.  Gastritis information.  No NSAIDS Resume medications including omeprazole 40mg . Daily.

## 2016-01-09 NOTE — Progress Notes (Signed)
Called to room to assist during endoscopic procedure.  Patient ID and intended procedure confirmed with present staff. Received instructions for my participation in the procedure from the performing physician.  

## 2016-01-09 NOTE — Op Note (Signed)
[name], [id number]   ENDOSCOPY PROCEDURE REPORT  PATIENT: Priscilla, Hill  MR#: [id number] BIRTHDATE: October 21, 1966 , 37  yrs. old GENDER: female ENDOSCOPIST: Yetta Flock, MD REFERRED BY: PROCEDURE DATE:  01/09/2016 PROCEDURE:  EGD w/ biopsy ASA CLASS:     Class II INDICATIONS:  epigastric pain, history of H pylori in 2015. MEDICATIONS: Propofol 130 mg IV TOPICAL ANESTHETIC:  DESCRIPTION OF PROCEDURE: After the risks benefits and alternatives of the procedure were thoroughly explained, informed consent was obtained.  The LB [id number] endoscope was introduced through the mouth and advanced to the second portion of the duodenum , Without limitations.  The instrument was slowly withdrawn as the mucosa was fully examined.    The esophagus was normal.  DH, GEJ, and SCJ located 39cm from the incisors.  The stomach was remarkable for some old heme and patchy erythema in the antrum and distal body.  No focal ulcerations or erosions were noted.  Biopsies were taken from the antrum and body to rule out H pylori.  The duodenal bulb and 2nd portion of the duodenum were normal.  Retroflexed views revealed no abnormalities. The scope was then withdrawn from the patient and the procedure completed.  COMPLICATIONS: There were no immediate complications.  ENDOSCOPIC IMPRESSION: Normal esophagus Gastritis - biopsies obtained to rule out H pylori Normal duodenum  RECOMMENDATIONS: Await pathology results Resume diet Resume medications including omeprazole 40mg , if you do not have any of this left we can prescribe it for you No NSAIDs   eSigned:  Yetta Flock, MD 01/09/2016 3:38 PM    CC: the patient

## 2016-01-12 ENCOUNTER — Telehealth: Payer: Self-pay | Admitting: *Deleted

## 2016-01-12 NOTE — Telephone Encounter (Signed)
  Follow up Call-  Call back number 01/09/2016 04/17/2014  Post procedure Call Back phone  # [phone]  Permission to leave phone message Yes Yes     Patient questions:  Message left to call us if necessary.

## 2016-01-14 ENCOUNTER — Ambulatory Visit (INDEPENDENT_AMBULATORY_CARE_PROVIDER_SITE_OTHER): Payer: PRIVATE HEALTH INSURANCE | Admitting: Internal Medicine

## 2016-01-14 ENCOUNTER — Telehealth: Payer: Self-pay | Admitting: Internal Medicine

## 2016-01-14 ENCOUNTER — Encounter: Payer: Self-pay | Admitting: Internal Medicine

## 2016-01-14 VITALS — BP 110/74 | HR 77 | Ht 65.0 in | Wt 137.0 lb

## 2016-01-14 DIAGNOSIS — B9681 Helicobacter pylori [H. pylori] as the cause of diseases classified elsewhere: Secondary | ICD-10-CM

## 2016-01-14 DIAGNOSIS — H6121 Impacted cerumen, right ear: Secondary | ICD-10-CM | POA: Diagnosis not present

## 2016-01-14 DIAGNOSIS — Z0000 Encounter for general adult medical examination without abnormal findings: Secondary | ICD-10-CM | POA: Diagnosis not present

## 2016-01-14 DIAGNOSIS — A048 Other specified bacterial intestinal infections: Secondary | ICD-10-CM

## 2016-01-14 DIAGNOSIS — K2970 Gastritis, unspecified, without bleeding: Secondary | ICD-10-CM

## 2016-01-14 DIAGNOSIS — K2990 Gastroduodenitis, unspecified, without bleeding: Secondary | ICD-10-CM | POA: Diagnosis not present

## 2016-01-14 NOTE — Progress Notes (Signed)
Pre visit review using our clinic review tool, if applicable. No additional management support is needed unless otherwise documented below in the visit note. 

## 2016-01-14 NOTE — Progress Notes (Signed)
Subjective:  Patient ID: Priscilla Hill, female    DOB: 1966/11/05  Age: 50 y.o. MRN: [id number]  CC: Annual Exam   HPI Priscilla Hill presents for a well exam F/u gastritis - s/p EGD 2/17 (H Pylori negative). C/o R ear irritation x 1 wk. Pt went to see ENT  Outpatient Prescriptions Prior to Visit  Medication Sig Dispense Refill  . b complex vitamins tablet Take 1 tablet by mouth daily.    Marland Kitchen VITAMIN D, CHOLECALCIFEROL, PO Take 1 tablet by mouth daily.     No facility-administered medications prior to visit.    ROS Review of Systems  Constitutional: Negative for chills, activity change, appetite change, fatigue and unexpected weight change.  HENT: Negative for congestion, mouth sores and sinus pressure.   Eyes: Negative for visual disturbance.  Respiratory: Negative for cough and chest tightness.   Gastrointestinal: Negative for nausea and abdominal pain.  Genitourinary: Negative for frequency, difficulty urinating and vaginal pain.  Musculoskeletal: Negative for back pain and gait problem.  Skin: Negative for pallor and rash.  Neurological: Negative for dizziness, tremors, weakness, numbness and headaches.  Psychiatric/Behavioral: Negative for suicidal ideas, confusion and sleep disturbance. The patient is not nervous/anxious.     Objective:  BP 110/74 mmHg  Pulse 77  Ht 5\' 5"  (1.651 m)  Wt 137 lb (62.143 kg)  BMI 22.80 kg/m2  SpO2 98%  BP Readings from Last 3 Encounters:  01/14/16 110/74  01/09/16 116/80  12/22/15 100/60    Wt Readings from Last 3 Encounters:  01/14/16 137 lb (62.143 kg)  01/09/16 134 lb (60.782 kg)  12/22/15 134 lb 8 oz (61.009 kg)    Physical Exam  Constitutional: She appears well-developed. No distress.  HENT:  Head: Normocephalic.  Right Ear: External ear normal.  Left Ear: External ear normal.  Nose: Nose normal.  Mouth/Throat: Oropharynx is clear and moist.  Eyes: Conjunctivae are normal. Pupils are equal, round, and reactive to  light. Right eye exhibits no discharge. Left eye exhibits no discharge.  Neck: Normal range of motion. Neck supple. No JVD present. No tracheal deviation present. No thyromegaly present.  Cardiovascular: Normal rate, regular rhythm and normal heart sounds.   Pulmonary/Chest: No stridor. No respiratory distress. She has no wheezes.  Abdominal: Soft. Bowel sounds are normal. She exhibits no distension and no mass. There is no tenderness. There is no rebound and no guarding.  Musculoskeletal: She exhibits no edema or tenderness.  Lymphadenopathy:    She has no cervical adenopathy.  Neurological: She displays normal reflexes. No cranial nerve deficit. She exhibits normal muscle tone. Coordination normal.  Skin: No rash noted. No erythema.  Psychiatric: She has a normal mood and affect. Her behavior is normal. Judgment and thought content normal.   Wax removed from the R ear  Lab Results  Component Value Date   WBC 5.8 02/26/2015   HGB 14.0 02/26/2015   HCT 41.8 02/26/2015   PLT 192.0 02/26/2015   GLUCOSE 92 04/07/2015   CHOL 186 11/14/2012   TRIG 71.0 11/14/2012   HDL 47.70 11/14/2012   LDLCALC 124* 11/14/2012   ALT 18 04/07/2015   AST 20 04/07/2015   NA 144 04/07/2015   K 4.0 04/07/2015   CL 103 02/26/2015   CREATININE 0.8 04/07/2015   BUN 7.7 04/07/2015   CO2 27 04/07/2015   TSH 2.35 02/26/2015   INR 0.94 05/23/2013    Ct Chest W Contrast  04/07/2015  CLINICAL DATA:  Subsequent encounter for  colon cancer EXAM: CT CHEST, ABDOMEN, AND PELVIS WITH CONTRAST TECHNIQUE: Multidetector CT imaging of the chest, abdomen and pelvis was performed following the standard protocol during bolus administration of intravenous contrast. CONTRAST:  139mL OMNIPAQUE IOHEXOL 300 MG/ML  SOLN COMPARISON:  04/02/2014. FINDINGS: CT CHEST FINDINGS Mediastinum/Nodes: There is no axillary lymphadenopathy. There is no mediastinal lymphadenopathy. No hilar lymphadenopathy. 8-9 mm short axis right hilar lymph  node is stable. The heart size is normal. No pericardial effusion. Lungs/Pleura: No focal airspace consolidation. No pulmonary nodule or mass. No pleural effusion. Musculoskeletal: Bone windows reveal no worrisome lytic or sclerotic osseous lesions. CT ABDOMEN AND PELVIS FINDINGS Hepatobiliary: No focal abnormality within the liver parenchyma. There is no evidence for gallstones, gallbladder wall thickening, or pericholecystic fluid. No intrahepatic or extrahepatic biliary dilation. Pancreas: No focal mass lesion. No dilatation of the main duct. No intraparenchymal cyst. No peripancreatic edema. Spleen: No splenomegaly. No focal mass lesion. Adrenals/Urinary Tract: No adrenal nodule or mass. No evidence for renal mass. No hydronephrosis. Bladder is unremarkable. Stomach/Bowel: Stomach is nondistended. No gastric wall thickening. No evidence of outlet obstruction. Duodenum is normally positioned as is the ligament of Treitz. No small bowel wall thickening. No small bowel dilatation. Terminal ileum is normal. The appendix is normal. Mid sigmoid anastomosis is evident with some redundant colonic mucosa at the anastomosis. Colon otherwise unremarkable in appearance. Vascular/Lymphatic: No abdominal aortic aneurysm. No abdominal lymphadenopathy. No pelvic sidewall lymphadenopathy. Reproductive: Uterus is prominent with evidence of fibroid change. No adnexal mass. Other: No intraperitoneal free fluid. Musculoskeletal: Bone windows reveal no worrisome lytic or sclerotic osseous lesions. IMPRESSION: Stable exam. No new or progressive findings to suggest metastatic disease. Electronically Signed   By: Misty Stanley M.D.   On: 04/07/2015 10:18   Ct Abdomen Pelvis W Contrast  04/07/2015  CLINICAL DATA:  Subsequent encounter for colon cancer EXAM: CT CHEST, ABDOMEN, AND PELVIS WITH CONTRAST TECHNIQUE: Multidetector CT imaging of the chest, abdomen and pelvis was performed following the standard protocol during bolus  administration of intravenous contrast. CONTRAST:  185mL OMNIPAQUE IOHEXOL 300 MG/ML  SOLN COMPARISON:  04/02/2014. FINDINGS: CT CHEST FINDINGS Mediastinum/Nodes: There is no axillary lymphadenopathy. There is no mediastinal lymphadenopathy. No hilar lymphadenopathy. 8-9 mm short axis right hilar lymph node is stable. The heart size is normal. No pericardial effusion. Lungs/Pleura: No focal airspace consolidation. No pulmonary nodule or mass. No pleural effusion. Musculoskeletal: Bone windows reveal no worrisome lytic or sclerotic osseous lesions. CT ABDOMEN AND PELVIS FINDINGS Hepatobiliary: No focal abnormality within the liver parenchyma. There is no evidence for gallstones, gallbladder wall thickening, or pericholecystic fluid. No intrahepatic or extrahepatic biliary dilation. Pancreas: No focal mass lesion. No dilatation of the main duct. No intraparenchymal cyst. No peripancreatic edema. Spleen: No splenomegaly. No focal mass lesion. Adrenals/Urinary Tract: No adrenal nodule or mass. No evidence for renal mass. No hydronephrosis. Bladder is unremarkable. Stomach/Bowel: Stomach is nondistended. No gastric wall thickening. No evidence of outlet obstruction. Duodenum is normally positioned as is the ligament of Treitz. No small bowel wall thickening. No small bowel dilatation. Terminal ileum is normal. The appendix is normal. Mid sigmoid anastomosis is evident with some redundant colonic mucosa at the anastomosis. Colon otherwise unremarkable in appearance. Vascular/Lymphatic: No abdominal aortic aneurysm. No abdominal lymphadenopathy. No pelvic sidewall lymphadenopathy. Reproductive: Uterus is prominent with evidence of fibroid change. No adnexal mass. Other: No intraperitoneal free fluid. Musculoskeletal: Bone windows reveal no worrisome lytic or sclerotic osseous lesions. IMPRESSION: Stable exam. No new or progressive findings  to suggest metastatic disease. Electronically Signed   By: Misty Stanley M.D.   On:  04/07/2015 10:18    Assessment & Plan:   [place] was seen today for annual exam.  Diagnoses and all orders for this visit:  Cerumen impaction, right  Gastritis and gastroduodenitis  I am having Ms. Worthey maintain her b complex vitamins and (VITAMIN D, CHOLECALCIFEROL, PO).  No orders of the defined types were placed in this encounter.     Follow-up: No Follow-up on file.  Walker Kehr, MD

## 2016-01-14 NOTE — Assessment & Plan Note (Signed)
Wax removed

## 2016-01-14 NOTE — Assessment & Plan Note (Signed)
H pylori  (-) in 2/17 EGD Dr Zachery Conch Prilosec

## 2016-01-14 NOTE — Telephone Encounter (Signed)
I called pt- she states she will go to the [facility] lab. Orders are placed.

## 2016-01-14 NOTE — Telephone Encounter (Signed)
Erline Levine, please,ask patient if she did the bloodwork that was ordered in 11/16. If not - pls ask her to do it Thx

## 2016-01-14 NOTE — Assessment & Plan Note (Signed)
We discussed age appropriate health related issues, including available/recomended screening tests and vaccinations. We discussed a need for adhering to healthy diet and exercise. Labs/EKG were reviewed/ordered. All questions were answered.   

## 2016-01-16 ENCOUNTER — Other Ambulatory Visit (INDEPENDENT_AMBULATORY_CARE_PROVIDER_SITE_OTHER): Payer: PRIVATE HEALTH INSURANCE

## 2016-01-16 ENCOUNTER — Telehealth: Payer: Self-pay | Admitting: Oncology

## 2016-01-16 DIAGNOSIS — Z0000 Encounter for general adult medical examination without abnormal findings: Secondary | ICD-10-CM | POA: Diagnosis not present

## 2016-01-16 LAB — BASIC METABOLIC PANEL
BUN: 10 mg/dL (ref 6–23)
CALCIUM: 9.4 mg/dL (ref 8.4–10.5)
CHLORIDE: 107 meq/L (ref 96–112)
CO2: 31 mEq/L (ref 19–32)
CREATININE: 0.68 mg/dL (ref 0.40–1.20)
GFR: 96.97 mL/min (ref >60.00)
Glucose, Bld: 91 mg/dL (ref 70–99)
Potassium: 4.1 mEq/L (ref 3.5–5.1)
Sodium: 142 mEq/L (ref 135–145)

## 2016-01-16 LAB — HEPATIC FUNCTION PANEL
ALBUMIN: 4.4 g/dL (ref 3.5–5.2)
ALK PHOS: 67 U/L (ref 39–117)
ALT: 16 U/L (ref 0–35)
AST: 15 U/L (ref 0–37)
BILIRUBIN DIRECT: 0.1 mg/dL (ref 0.0–0.3)
BILIRUBIN TOTAL: 0.7 mg/dL (ref 0.2–1.2)
TOTAL PROTEIN: 7.5 g/dL (ref 6.0–8.3)

## 2016-01-16 LAB — TSH: TSH: 1.33 u[IU]/mL (ref 0.35–4.50)

## 2016-01-16 LAB — URINALYSIS, ROUTINE W REFLEX MICROSCOPIC
BILIRUBIN URINE: NEGATIVE
KETONES UR: NEGATIVE
LEUKOCYTES UA: NEGATIVE
NITRITE: NEGATIVE
Specific Gravity, Urine: 1.015 (ref 1.000–1.030)
Total Protein, Urine: NEGATIVE
URINE GLUCOSE: NEGATIVE
Urobilinogen, UA: 0.2 (ref 0.0–1.0)
pH: 7 (ref 5.0–8.0)

## 2016-01-16 LAB — CBC WITH DIFFERENTIAL/PLATELET
BASOS ABS: 0 10*3/uL (ref 0.0–0.1)
BASOS PCT: 0.4 % (ref 0.0–3.0)
Eosinophils Absolute: 0.1 10*3/uL (ref 0.0–0.7)
Eosinophils Relative: 1.9 % (ref 0.0–5.0)
HCT: 39.7 % (ref 36.0–46.0)
Hemoglobin: 13.3 g/dL (ref 12.0–15.0)
LYMPHS ABS: 1.7 10*3/uL (ref 0.7–4.0)
Lymphocytes Relative: 27.4 % (ref 12.0–46.0)
MCHC: 33.5 g/dL (ref 30.0–36.0)
MCV: 89.5 fl (ref 78.0–100.0)
MONOS PCT: 7.4 % (ref 3.0–12.0)
Monocytes Absolute: 0.5 10*3/uL (ref 0.1–1.0)
NEUTROS ABS: 4 10*3/uL (ref 1.4–7.7)
NEUTROS PCT: 62.9 % (ref 43.0–77.0)
PLATELETS: 220 10*3/uL (ref 150.0–400.0)
RBC: 4.43 Mil/uL (ref 3.87–5.11)
RDW: 13.3 % (ref 11.5–15.5)
WBC: 6.4 10*3/uL (ref 4.0–10.5)

## 2016-01-16 LAB — LIPID PANEL
CHOL/HDL RATIO: 1
Cholesterol: 56 mg/dL (ref 0–200)
HDL: 69.5 mg/dL (ref >39.00)
LDL Cholesterol: -21 mg/dL — ABNORMAL LOW (ref 0–99)
NONHDL: -13.38
Triglycerides: 39 mg/dL (ref 0.0–149.0)
VLDL: 7.8 mg/dL (ref 0.0–40.0)

## 2016-01-16 NOTE — Telephone Encounter (Signed)
pt came in to r/s appt-gave pt copy of avs and contrast

## 2016-02-24 ENCOUNTER — Ambulatory Visit: Payer: Self-pay | Admitting: Internal Medicine

## 2016-02-27 ENCOUNTER — Encounter: Payer: Self-pay | Admitting: Internal Medicine

## 2016-02-27 ENCOUNTER — Ambulatory Visit (INDEPENDENT_AMBULATORY_CARE_PROVIDER_SITE_OTHER): Payer: PRIVATE HEALTH INSURANCE | Admitting: Internal Medicine

## 2016-02-27 VITALS — BP 100/78 | HR 80 | Temp 98.4°F | Resp 16 | Ht 66.0 in | Wt 133.4 lb

## 2016-02-27 DIAGNOSIS — R42 Dizziness and giddiness: Secondary | ICD-10-CM | POA: Diagnosis not present

## 2016-02-27 DIAGNOSIS — H6121 Impacted cerumen, right ear: Secondary | ICD-10-CM

## 2016-02-27 NOTE — Assessment & Plan Note (Addendum)
Wax removed manually  Pt will see ENT - Dr Constance Holster

## 2016-02-27 NOTE — Progress Notes (Signed)
Subjective:  Patient ID: Priscilla Hill, female    DOB: 05/29/1966  Age: 50 y.o. MRN: [id number]  CC: No chief complaint on file.   HPI Priscilla Hill presents for R ear pressure, dull pain, occasional dizziness since Oct 2016  Outpatient Prescriptions Prior to Visit  Medication Sig Dispense Refill  . b complex vitamins tablet Take 1 tablet by mouth daily.    Marland Kitchen VITAMIN D, CHOLECALCIFEROL, PO Take 1 tablet by mouth daily.     No facility-administered medications prior to visit.    ROS Review of Systems  Constitutional: Negative for fever and chills.  HENT: Positive for ear discharge and ear pain. Negative for postnasal drip.   Psychiatric/Behavioral: The patient is not nervous/anxious.     Objective:  BP 100/78 mmHg  Pulse 80  Temp(Src) 98.4 F (36.9 C) (Oral)  Resp 16  Ht 5\' 6"  (1.676 m)  Wt 133 lb 6.4 oz (60.51 kg)  BMI 21.54 kg/m2  SpO2 99%  BP Readings from Last 3 Encounters:  02/27/16 100/78  01/14/16 110/74  01/09/16 116/80    Wt Readings from Last 3 Encounters:  02/27/16 133 lb 6.4 oz (60.51 kg)  01/14/16 137 lb (62.143 kg)  01/09/16 134 lb (60.782 kg)    Physical Exam  Constitutional: She appears well-developed. No distress.  HENT:  Head: Normocephalic.  Right Ear: External ear normal.  Left Ear: External ear normal.  Nose: Nose normal.  Mouth/Throat: Oropharynx is clear and moist. No oropharyngeal exudate.  Eyes: Conjunctivae are normal. Pupils are equal, round, and reactive to light. Right eye exhibits no discharge.  Neck: Normal range of motion. Neck supple.  Cardiovascular: Normal rate, regular rhythm and normal heart sounds.   Pulmonary/Chest: Stridor present. She has no wheezes.  Abdominal: Soft. Bowel sounds are normal.  Musculoskeletal: She exhibits tenderness.  Psychiatric: She has a normal mood and affect. Her behavior is normal. Judgment and thought content normal.   L ear wax - removed  >15 min  Lab Results  Component Value Date     WBC 6.4 01/16/2016   HGB 13.3 01/16/2016   HCT 39.7 01/16/2016   PLT 220.0 01/16/2016   GLUCOSE 91 01/16/2016   CHOL 56 01/16/2016   TRIG 39.0 01/16/2016   HDL 69.50 01/16/2016   LDLCALC -21* 01/16/2016   ALT 16 01/16/2016   AST 15 01/16/2016   NA 142 01/16/2016   K 4.1 01/16/2016   CL 107 01/16/2016   CREATININE 0.68 01/16/2016   BUN 10 01/16/2016   CO2 31 01/16/2016   TSH 1.33 01/16/2016   INR 0.94 05/23/2013    Ct Chest W Contrast  04/07/2015  CLINICAL DATA:  Subsequent encounter for colon cancer EXAM: CT CHEST, ABDOMEN, AND PELVIS WITH CONTRAST TECHNIQUE: Multidetector CT imaging of the chest, abdomen and pelvis was performed following the standard protocol during bolus administration of intravenous contrast. CONTRAST:  153mL OMNIPAQUE IOHEXOL 300 MG/ML  SOLN COMPARISON:  04/02/2014. FINDINGS: CT CHEST FINDINGS Mediastinum/Nodes: There is no axillary lymphadenopathy. There is no mediastinal lymphadenopathy. No hilar lymphadenopathy. 8-9 mm short axis right hilar lymph node is stable. The heart size is normal. No pericardial effusion. Lungs/Pleura: No focal airspace consolidation. No pulmonary nodule or mass. No pleural effusion. Musculoskeletal: Bone windows reveal no worrisome lytic or sclerotic osseous lesions. CT ABDOMEN AND PELVIS FINDINGS Hepatobiliary: No focal abnormality within the liver parenchyma. There is no evidence for gallstones, gallbladder wall thickening, or pericholecystic fluid. No intrahepatic or extrahepatic biliary dilation. Pancreas: No  focal mass lesion. No dilatation of the main duct. No intraparenchymal cyst. No peripancreatic edema. Spleen: No splenomegaly. No focal mass lesion. Adrenals/Urinary Tract: No adrenal nodule or mass. No evidence for renal mass. No hydronephrosis. Bladder is unremarkable. Stomach/Bowel: Stomach is nondistended. No gastric wall thickening. No evidence of outlet obstruction. Duodenum is normally positioned as is the ligament of Treitz.  No small bowel wall thickening. No small bowel dilatation. Terminal ileum is normal. The appendix is normal. Mid sigmoid anastomosis is evident with some redundant colonic mucosa at the anastomosis. Colon otherwise unremarkable in appearance. Vascular/Lymphatic: No abdominal aortic aneurysm. No abdominal lymphadenopathy. No pelvic sidewall lymphadenopathy. Reproductive: Uterus is prominent with evidence of fibroid change. No adnexal mass. Other: No intraperitoneal free fluid. Musculoskeletal: Bone windows reveal no worrisome lytic or sclerotic osseous lesions. IMPRESSION: Stable exam. No new or progressive findings to suggest metastatic disease. Electronically Signed   By: Misty Stanley M.D.   On: 04/07/2015 10:18   Ct Abdomen Pelvis W Contrast  04/07/2015  CLINICAL DATA:  Subsequent encounter for colon cancer EXAM: CT CHEST, ABDOMEN, AND PELVIS WITH CONTRAST TECHNIQUE: Multidetector CT imaging of the chest, abdomen and pelvis was performed following the standard protocol during bolus administration of intravenous contrast. CONTRAST:  13mL OMNIPAQUE IOHEXOL 300 MG/ML  SOLN COMPARISON:  04/02/2014. FINDINGS: CT CHEST FINDINGS Mediastinum/Nodes: There is no axillary lymphadenopathy. There is no mediastinal lymphadenopathy. No hilar lymphadenopathy. 8-9 mm short axis right hilar lymph node is stable. The heart size is normal. No pericardial effusion. Lungs/Pleura: No focal airspace consolidation. No pulmonary nodule or mass. No pleural effusion. Musculoskeletal: Bone windows reveal no worrisome lytic or sclerotic osseous lesions. CT ABDOMEN AND PELVIS FINDINGS Hepatobiliary: No focal abnormality within the liver parenchyma. There is no evidence for gallstones, gallbladder wall thickening, or pericholecystic fluid. No intrahepatic or extrahepatic biliary dilation. Pancreas: No focal mass lesion. No dilatation of the main duct. No intraparenchymal cyst. No peripancreatic edema. Spleen: No splenomegaly. No focal mass  lesion. Adrenals/Urinary Tract: No adrenal nodule or mass. No evidence for renal mass. No hydronephrosis. Bladder is unremarkable. Stomach/Bowel: Stomach is nondistended. No gastric wall thickening. No evidence of outlet obstruction. Duodenum is normally positioned as is the ligament of Treitz. No small bowel wall thickening. No small bowel dilatation. Terminal ileum is normal. The appendix is normal. Mid sigmoid anastomosis is evident with some redundant colonic mucosa at the anastomosis. Colon otherwise unremarkable in appearance. Vascular/Lymphatic: No abdominal aortic aneurysm. No abdominal lymphadenopathy. No pelvic sidewall lymphadenopathy. Reproductive: Uterus is prominent with evidence of fibroid change. No adnexal mass. Other: No intraperitoneal free fluid. Musculoskeletal: Bone windows reveal no worrisome lytic or sclerotic osseous lesions. IMPRESSION: Stable exam. No new or progressive findings to suggest metastatic disease. Electronically Signed   By: Misty Stanley M.D.   On: 04/07/2015 10:18    Assessment & Plan:   There are no diagnoses linked to this encounter. I am having Ms. Peregoy maintain her b complex vitamins and (VITAMIN D, CHOLECALCIFEROL, PO).  No orders of the defined types were placed in this encounter.     Follow-up: No Follow-up on file.  Walker Kehr, MD

## 2016-02-27 NOTE — Progress Notes (Signed)
Pre visit review using our clinic review tool, if applicable. No additional management support is needed unless otherwise documented below in the visit note. 

## 2016-02-27 NOTE — Patient Instructions (Addendum)
Elie Goody - ENT

## 2016-02-28 ENCOUNTER — Encounter: Payer: Self-pay | Admitting: Internal Medicine

## 2016-02-28 DIAGNOSIS — R42 Dizziness and giddiness: Secondary | ICD-10-CM | POA: Insufficient documentation

## 2016-02-28 NOTE — Assessment & Plan Note (Signed)
Pt will see ENT - Dr Constance Holster if sx's relapsed

## 2016-03-24 ENCOUNTER — Encounter (HOSPITAL_COMMUNITY): Payer: Self-pay

## 2016-03-24 ENCOUNTER — Other Ambulatory Visit (HOSPITAL_BASED_OUTPATIENT_CLINIC_OR_DEPARTMENT_OTHER): Payer: PRIVATE HEALTH INSURANCE

## 2016-03-24 ENCOUNTER — Ambulatory Visit (HOSPITAL_COMMUNITY)
Admission: RE | Admit: 2016-03-24 | Discharge: 2016-03-24 | Disposition: A | Discharge status: Home / Self Care | Payer: PRIVATE HEALTH INSURANCE | Source: Ambulatory Visit | Attending: Oncology | Admitting: Oncology

## 2016-03-24 DIAGNOSIS — D259 Leiomyoma of uterus, unspecified: Secondary | ICD-10-CM | POA: Diagnosis not present

## 2016-03-24 DIAGNOSIS — C187 Malignant neoplasm of sigmoid colon: Secondary | ICD-10-CM | POA: Diagnosis not present

## 2016-03-24 LAB — BASIC METABOLIC PANEL
Anion Gap: 9 mEq/L (ref 3–11)
BUN: 11.5 mg/dL (ref 7.0–26.0)
CHLORIDE: 107 meq/L (ref 98–109)
CO2: 28 meq/L (ref 22–29)
Calcium: 9.9 mg/dL (ref 8.4–10.4)
Creatinine: 0.8 mg/dL (ref 0.6–1.1)
EGFR: 87 mL/min/{1.73_m2} — AB (ref >90)
Glucose: 82 mg/dl (ref 70–140)
POTASSIUM: 3.8 meq/L (ref 3.5–5.1)
SODIUM: 144 meq/L (ref 136–145)

## 2016-03-24 MED ORDER — IOPAMIDOL (ISOVUE-300) INJECTION 61%
100.0000 mL | Freq: Once | INTRAVENOUS | Status: AC | PRN
  Administered 2016-03-24: 100 mL via INTRAVENOUS

## 2016-03-25 ENCOUNTER — Telehealth: Payer: Self-pay | Admitting: *Deleted

## 2016-03-25 ENCOUNTER — Other Ambulatory Visit: Payer: PRIVATE HEALTH INSURANCE

## 2016-03-25 LAB — CEA: CEA: 1.1 ng/mL (ref 0.0–4.7)

## 2016-03-25 LAB — CEA (PARALLEL TESTING)

## 2016-03-25 NOTE — Telephone Encounter (Signed)
-----   Message from Ladell Pier, MD sent at 03/24/2016  8:01 PM EDT ----- Please call patient,CTs negative for cancer

## 2016-03-29 ENCOUNTER — Other Ambulatory Visit: Payer: PRIVATE HEALTH INSURANCE

## 2016-03-29 ENCOUNTER — Ambulatory Visit (HOSPITAL_BASED_OUTPATIENT_CLINIC_OR_DEPARTMENT_OTHER): Payer: PRIVATE HEALTH INSURANCE | Admitting: Oncology

## 2016-03-29 ENCOUNTER — Telehealth: Payer: Self-pay | Admitting: Oncology

## 2016-03-29 VITALS — BP 120/74 | HR 63 | Temp 98.2°F | Resp 20 | Ht 66.0 in | Wt 132.6 lb

## 2016-03-29 DIAGNOSIS — D701 Agranulocytosis secondary to cancer chemotherapy: Secondary | ICD-10-CM

## 2016-03-29 DIAGNOSIS — D509 Iron deficiency anemia, unspecified: Secondary | ICD-10-CM | POA: Diagnosis not present

## 2016-03-29 DIAGNOSIS — Z85038 Personal history of other malignant neoplasm of large intestine: Secondary | ICD-10-CM | POA: Diagnosis not present

## 2016-03-29 DIAGNOSIS — D6959 Other secondary thrombocytopenia: Secondary | ICD-10-CM | POA: Diagnosis not present

## 2016-03-29 DIAGNOSIS — G629 Polyneuropathy, unspecified: Secondary | ICD-10-CM

## 2016-03-29 DIAGNOSIS — C187 Malignant neoplasm of sigmoid colon: Secondary | ICD-10-CM

## 2016-03-29 NOTE — Telephone Encounter (Signed)
Gave pt appt & avs °

## 2016-03-29 NOTE — Progress Notes (Signed)
  Priscilla Hill OFFICE PROGRESS NOTE   Diagnosis:  Colon cancer  INTERVAL HISTORY:    Priscilla Hill returns as scheduled. She feels well. No complaint. The mild foot numbness continues to improve. This does not interfere with activity.  Objective:  Vital signs in last 24 hours:  Blood pressure 120/74, pulse 63, temperature 98.2 F (36.8 C), temperature source Oral, resp. rate 20, height '5\' 6"'$  (1.676 m), weight 132 lb 9.6 oz (60.147 kg), SpO2 99 %.    HEENT:  Neck without mass Lymphatics:  No cervical, supra-clavicular, axillary , or inguinal nodes Resp:  Lungs clear bilaterally Cardio:  Regular rate and rhythm GI:  No hepatomegaly, no mass, nontender Vascular:  No leg edema   Lab Results:   Lab Results  Component Value Date   CEA1 1.1 03/24/2016    Imaging:  No results found.  Medications: I have reviewed the patient's current medications.  Assessment/Plan: 1. 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 The tumor returned microsatellite stable with no loss of mismatch repair protein expression.  Cycle 1 adjuvant FOLFOX chemotherapy 05/28/2013.   Oxaliplatin was held beginning with cycle 8 due to neuropathy symptoms.   Final cycle of 5-fluorouracil/leucovorin given on 10/29/2013.  03/04/2014 CEA less than 0.5.  04/02/2014 CT chest/abdomen/pelvis negative for metastatic disease.  Colonoscopy 04/17/2014-negative  04/07/2015 CT chest/abdomen/pelvis negative for metastatic disease  CTs of the chest, abdomen, and pelvis 03/24/2016-negative for recurrent colon cancer 2. History of microcytic anemia.  3. Port-A-Cath placement 05/24/2013. 4. History of uterine fibroids. 5. Nausea following cycle 1 FOLFOX. Aloxi and prophylactic Decadron were added with cycle 2. She had nausea following cycle 2. Emend was added with cycle 3. Improved. No nausea following cycle 5 or 6. 6. Mild  neutropenia secondary chemotherapy. She received Neulasta beginning with cycle 4 FOLFOX  7. Abdominal discomfort following cycle 4 FOLFOX. Question gastritis related to steroids. She began Prilosec 20 mg daily. 8. History of mild thrombocytopenia secondary chemotherapy. 9. Pruritus and erythema over the palms beginning day 4 following cycle 5 FOLFOX and lasting approximately 3 days. Question hand-foot syndrome related to 5-fluorouracil, question allergic reaction. She was instructed to contact the office if she experiences this again. 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 Persistent numbness in the feet -improved 11. Soft mobile left axillary lymph node on exam 10/07/2014. Stable on exam 04/09/2015. Not noted on exam 10/06/2015 or 03/29/2016   Disposition:   Priscilla Hill appears stable. She is in clinical remission from colon cancer. She will return for a CEA in 6 months and an office visit in one year. She will be due for a colonoscopy next year.  Betsy Coder, MD  03/29/2016  12:00 PM

## 2016-10-11 ENCOUNTER — Other Ambulatory Visit (HOSPITAL_BASED_OUTPATIENT_CLINIC_OR_DEPARTMENT_OTHER): Payer: PRIVATE HEALTH INSURANCE

## 2016-10-11 DIAGNOSIS — Z85038 Personal history of other malignant neoplasm of large intestine: Secondary | ICD-10-CM

## 2016-10-11 DIAGNOSIS — C187 Malignant neoplasm of sigmoid colon: Secondary | ICD-10-CM

## 2016-10-11 LAB — CEA (IN HOUSE-CHCC): CEA (CHCC-In House): <1 ng/mL (ref 0.00–5.00)

## 2016-10-12 LAB — CEA: CEA1: 1.1 ng/mL (ref 0.0–4.7)

## 2016-10-13 ENCOUNTER — Encounter: Payer: Self-pay | Admitting: Internal Medicine

## 2016-10-13 ENCOUNTER — Ambulatory Visit (INDEPENDENT_AMBULATORY_CARE_PROVIDER_SITE_OTHER): Payer: PRIVATE HEALTH INSURANCE | Admitting: Internal Medicine

## 2016-10-13 DIAGNOSIS — M25531 Pain in right wrist: Secondary | ICD-10-CM

## 2016-10-13 DIAGNOSIS — G5621 Lesion of ulnar nerve, right upper limb: Secondary | ICD-10-CM

## 2016-10-13 DIAGNOSIS — H6121 Impacted cerumen, right ear: Secondary | ICD-10-CM | POA: Diagnosis not present

## 2016-10-13 MED ORDER — MELOXICAM 7.5 MG PO TABS: 7.5000 mg | ORAL_TABLET | Freq: Every day | ORAL | 1 refills | Status: DC

## 2016-10-13 MED ORDER — NEOMYCIN-POLYMYXIN-HC 3.5-10000-1 OT SOLN: 3.0000 [drp] | Freq: Three times a day (TID) | OTIC | 1 refills | Status: AC

## 2016-10-13 NOTE — Assessment & Plan Note (Addendum)
Reduce pressure to ulnar aspect of the R hand when sitting at the table/desk

## 2016-10-13 NOTE — Progress Notes (Signed)
Subjective:  Patient ID: Priscilla Hill, female    DOB: Jul 31, 1966  Age: 50 y.o. MRN: [id number]  CC: Wrist Pain (Right x 2 months) and Neck Pain   HPI Priscilla Hill presents for R wrist pain x 2 mo - no injury. Worse with ROM. The pt works in the [employer] C/o R fingers #4-5 numbness at times  Outpatient Medications Prior to Visit  Medication Sig Dispense Refill  . b complex vitamins tablet Take 1 tablet by mouth daily.    Marland Kitchen VITAMIN D, CHOLECALCIFEROL, PO Take 1 tablet by mouth daily.     No facility-administered medications prior to visit.     ROS Review of Systems  Constitutional: Negative for activity change, appetite change, chills, fatigue and unexpected weight change.  HENT: Negative for congestion, mouth sores and sinus pressure.   Eyes: Negative for visual disturbance.  Respiratory: Negative for cough and chest tightness.   Gastrointestinal: Negative for abdominal pain and nausea.  Genitourinary: Negative for difficulty urinating, frequency and vaginal pain.  Musculoskeletal: Positive for arthralgias. Negative for back pain and gait problem.  Skin: Negative for pallor and rash.  Neurological: Positive for numbness. Negative for dizziness, tremors, weakness and headaches.  Psychiatric/Behavioral: Negative for confusion and sleep disturbance.    Objective:  BP 122/86   Pulse 72   Wt 137 lb (62.1 kg)   SpO2 97%   BMI 22.11 kg/m   BP Readings from Last 3 Encounters:  10/13/16 122/86  03/29/16 120/74  02/27/16 100/78    Wt Readings from Last 3 Encounters:  10/13/16 137 lb (62.1 kg)  03/29/16 132 lb 9.6 oz (60.1 kg)  02/27/16 133 lb 6.4 oz (60.5 kg)    Physical Exam  Constitutional: She appears well-developed. No distress.  HENT:  Head: Normocephalic.  Right Ear: External ear normal.  Left Ear: External ear normal.  Nose: Nose normal.  Mouth/Throat: Oropharynx is clear and moist.  Eyes: Conjunctivae are normal. Pupils are equal, round, and  reactive to light. Right eye exhibits no discharge. Left eye exhibits no discharge.  Neck: Normal range of motion. Neck supple. No JVD present. No tracheal deviation present. No thyromegaly present.  Cardiovascular: Normal rate, regular rhythm and normal heart sounds.   Pulmonary/Chest: No stridor. No respiratory distress. She has no wheezes.  Abdominal: Soft. Bowel sounds are normal. She exhibits no distension and no mass. There is no tenderness. There is no rebound and no guarding.  Musculoskeletal: She exhibits no edema or tenderness.  Lymphadenopathy:    She has no cervical adenopathy.  Neurological: She displays normal reflexes. No cranial nerve deficit. She exhibits normal muscle tone. Coordination normal.  Skin: No rash noted. No erythema.  Psychiatric: She has a normal mood and affect. Her behavior is normal. Judgment and thought content normal.  R wrist NT and w/o swelling Wax R ear   Procedure Note :     Procedure :  Ear irrigation R   Indication:  Cerumen impaction   Risks, including pain, dizziness, eardrum perforation, bleeding, infection and others as well as benefits were explained to the patient in detail. Verbal consent was obtained and the patient agreed to proceed.    We used "The Elephant Ear Irrigation Device" filled with lukewarm water for irrigation. A large amount wax was recovered. Procedure has also required manual wax removal with an ear loop.   Tolerated well. Complications: None.   Postprocedure instructions :  Call if problems.    Lab Results  Component Value Date   WBC 6.4 01/16/2016   HGB 13.3 01/16/2016   HCT 39.7 01/16/2016   PLT 220.0 01/16/2016   GLUCOSE 82 03/24/2016   CHOL 56 01/16/2016   TRIG 39.0 01/16/2016   HDL 69.50 01/16/2016   LDLCALC -21 (L) 01/16/2016   ALT 16 01/16/2016   AST 15 01/16/2016   NA 144 03/24/2016   K 3.8 03/24/2016   CL 107 01/16/2016   CREATININE 0.8 03/24/2016   BUN 11.5 03/24/2016   CO2 28 03/24/2016    TSH 1.33 01/16/2016   INR 0.94 05/23/2013    Ct Chest W Contrast  Result Date: 03/24/2016 CLINICAL DATA:  Colon cancer diagnosed in 2014. Resection. Chemotherapy complete. Sigmoid primary. EXAM: CT CHEST, ABDOMEN, AND PELVIS WITH CONTRAST TECHNIQUE: Multidetector CT imaging of the chest, abdomen and pelvis was performed following the standard protocol during bolus administration of intravenous contrast. CONTRAST:  161mL ISOVUE-300 IOPAMIDOL (ISOVUE-300) INJECTION 61% COMPARISON:  04/07/2015 FINDINGS: CT CHEST FINDINGS Mediastinum/Lymph Nodes: Normal heart size, without pericardial effusion. No central pulmonary embolism, on this non-dedicated study. No mediastinal or hilar adenopathy. Lungs/Pleura: No pleural fluid. Similar minimal subpleural right upper lobe nodularity on image 51/ series 4. Posterior right upper lobe subpleural nodularity on image 38/ series 4 felt to be similar, given differences in slice thickness. Musculoskeletal: No acute osseous abnormality. CT ABDOMEN PELVIS FINDINGS Hepatobiliary: Normal liver. Normal gallbladder, without biliary ductal dilatation. Pancreas: Normal, without mass or ductal dilatation. Spleen: Normal in size, without focal abnormality. Adrenals/Urinary Tract: Normal adrenal glands. Normal kidneys, without hydronephrosis. Normal urinary bladder. Stomach/Bowel: Normal stomach, without wall thickening. Colonic stool burden suggests constipation. No residual or recurrent disease within the sigmoid. Normal terminal ileum and appendix. Normal small bowel. Vascular/Lymphatic: Normal caliber of the aorta and branch vessels. No abdominopelvic adenopathy. Reproductive: Small uterine fundal fibroid of approximately 1.4 cm suspected on sagittal image 94. No adnexal mass. Other: No significant free fluid. No evidence of omental or peritoneal disease. Musculoskeletal: Minimal disc bulge at L5-S1. IMPRESSION: 1. No acute process or evidence of metastatic disease within the chest,  abdomen, or pelvis. 2.  Possible constipation. 3. Small uterine fundal fibroid. Electronically Signed   By: Abigail Miyamoto M.D.   On: 03/24/2016 10:51   Ct Abdomen Pelvis W Contrast  Result Date: 03/24/2016 CLINICAL DATA:  Colon cancer diagnosed in 2014. Resection. Chemotherapy complete. Sigmoid primary. EXAM: CT CHEST, ABDOMEN, AND PELVIS WITH CONTRAST TECHNIQUE: Multidetector CT imaging of the chest, abdomen and pelvis was performed following the standard protocol during bolus administration of intravenous contrast. CONTRAST:  137mL ISOVUE-300 IOPAMIDOL (ISOVUE-300) INJECTION 61% COMPARISON:  04/07/2015 FINDINGS: CT CHEST FINDINGS Mediastinum/Lymph Nodes: Normal heart size, without pericardial effusion. No central pulmonary embolism, on this non-dedicated study. No mediastinal or hilar adenopathy. Lungs/Pleura: No pleural fluid. Similar minimal subpleural right upper lobe nodularity on image 51/ series 4. Posterior right upper lobe subpleural nodularity on image 38/ series 4 felt to be similar, given differences in slice thickness. Musculoskeletal: No acute osseous abnormality. CT ABDOMEN PELVIS FINDINGS Hepatobiliary: Normal liver. Normal gallbladder, without biliary ductal dilatation. Pancreas: Normal, without mass or ductal dilatation. Spleen: Normal in size, without focal abnormality. Adrenals/Urinary Tract: Normal adrenal glands. Normal kidneys, without hydronephrosis. Normal urinary bladder. Stomach/Bowel: Normal stomach, without wall thickening. Colonic stool burden suggests constipation. No residual or recurrent disease within the sigmoid. Normal terminal ileum and appendix. Normal small bowel. Vascular/Lymphatic: Normal caliber of the aorta and branch vessels. No abdominopelvic adenopathy. Reproductive: Small uterine fundal fibroid of approximately  1.4 cm suspected on sagittal image 94. No adnexal mass. Other: No significant free fluid. No evidence of omental or peritoneal disease. Musculoskeletal:  Minimal disc bulge at L5-S1. IMPRESSION: 1. No acute process or evidence of metastatic disease within the chest, abdomen, or pelvis. 2.  Possible constipation. 3. Small uterine fundal fibroid. Electronically Signed   By: Abigail Miyamoto M.D.   On: 03/24/2016 10:51    Assessment & Plan:   There are no diagnoses linked to this encounter. I am having Ms. Farone maintain her b complex vitamins and (VITAMIN D, CHOLECALCIFEROL, PO).  No orders of the defined types were placed in this encounter.    Follow-up: No Follow-up on file.  Walker Kehr, MD

## 2016-10-13 NOTE — Assessment & Plan Note (Signed)
overuse dorsal tendinitis at work Meloxicam 1-2 a day Splint Massage

## 2016-10-13 NOTE — Assessment & Plan Note (Signed)
Will irrigate Cortisporin gtt Rx F/u ENT ref if reoccurrs

## 2016-10-13 NOTE — Progress Notes (Signed)
Pre visit review using our clinic review tool, if applicable. No additional management support is needed unless otherwise documented below in the visit note. 

## 2016-10-23 ENCOUNTER — Other Ambulatory Visit: Payer: Self-pay | Admitting: Internal Medicine

## 2016-10-26 NOTE — Telephone Encounter (Signed)
Omeprazole 40 mg is not on active list. Ok to Rf?

## 2016-11-16 ENCOUNTER — Other Ambulatory Visit: Payer: Self-pay | Admitting: Nurse Practitioner

## 2017-02-11 ENCOUNTER — Other Ambulatory Visit (INDEPENDENT_AMBULATORY_CARE_PROVIDER_SITE_OTHER): Payer: PRIVATE HEALTH INSURANCE

## 2017-02-11 ENCOUNTER — Ambulatory Visit (INDEPENDENT_AMBULATORY_CARE_PROVIDER_SITE_OTHER): Payer: PRIVATE HEALTH INSURANCE | Admitting: Internal Medicine

## 2017-02-11 ENCOUNTER — Encounter: Payer: Self-pay | Admitting: Internal Medicine

## 2017-02-11 VITALS — BP 100/72 | HR 78 | Temp 99.3°F | Resp 16 | Ht 65.25 in | Wt 139.2 lb

## 2017-02-11 DIAGNOSIS — G8929 Other chronic pain: Secondary | ICD-10-CM

## 2017-02-11 DIAGNOSIS — Z0000 Encounter for general adult medical examination without abnormal findings: Secondary | ICD-10-CM

## 2017-02-11 DIAGNOSIS — M25531 Pain in right wrist: Secondary | ICD-10-CM

## 2017-02-11 DIAGNOSIS — E559 Vitamin D deficiency, unspecified: Secondary | ICD-10-CM

## 2017-02-11 DIAGNOSIS — M5441 Lumbago with sciatica, right side: Secondary | ICD-10-CM

## 2017-02-11 LAB — HEPATIC FUNCTION PANEL
ALK PHOS: 75 U/L (ref 39–117)
ALT: 14 U/L (ref 0–35)
AST: 20 U/L (ref 0–37)
Albumin: 4.4 g/dL (ref 3.5–5.2)
BILIRUBIN DIRECT: 0.1 mg/dL (ref 0.0–0.3)
Total Bilirubin: 0.5 mg/dL (ref 0.2–1.2)
Total Protein: 7.2 g/dL (ref 6.0–8.3)

## 2017-02-11 LAB — CBC WITH DIFFERENTIAL/PLATELET
BASOS ABS: 0 10*3/uL (ref 0.0–0.1)
Basophils Relative: 0.7 % (ref 0.0–3.0)
EOS ABS: 0.1 10*3/uL (ref 0.0–0.7)
Eosinophils Relative: 1.7 % (ref 0.0–5.0)
HCT: 38.6 % (ref 36.0–46.0)
Hemoglobin: 13 g/dL (ref 12.0–15.0)
LYMPHS ABS: 1.2 10*3/uL (ref 0.7–4.0)
LYMPHS PCT: 23.1 % (ref 12.0–46.0)
MCHC: 33.7 g/dL (ref 30.0–36.0)
MCV: 90.9 fl (ref 78.0–100.0)
Monocytes Absolute: 0.4 10*3/uL (ref 0.1–1.0)
Monocytes Relative: 7 % (ref 3.0–12.0)
NEUTROS ABS: 3.6 10*3/uL (ref 1.4–7.7)
NEUTROS PCT: 67.5 % (ref 43.0–77.0)
Platelets: 201 10*3/uL (ref 150.0–400.0)
RBC: 4.25 Mil/uL (ref 3.87–5.11)
RDW: 12.6 % (ref 11.5–15.5)
WBC: 5.4 10*3/uL (ref 4.0–10.5)

## 2017-02-11 LAB — LIPID PANEL
CHOL/HDL RATIO: 4
Cholesterol: 196 mg/dL (ref 0–200)
HDL: 52.1 mg/dL (ref >39.00)
LDL CALC: 123 mg/dL — AB (ref 0–99)
NONHDL: 143.86
Triglycerides: 103 mg/dL (ref 0.0–149.0)
VLDL: 20.6 mg/dL (ref 0.0–40.0)

## 2017-02-11 LAB — BASIC METABOLIC PANEL
BUN: 9 mg/dL (ref 6–23)
CALCIUM: 9.8 mg/dL (ref 8.4–10.5)
CO2: 27 meq/L (ref 19–32)
CREATININE: 0.67 mg/dL (ref 0.40–1.20)
Chloride: 107 mEq/L (ref 96–112)
GFR: 98.22 mL/min (ref >60.00)
GLUCOSE: 88 mg/dL (ref 70–99)
Potassium: 3.9 mEq/L (ref 3.5–5.1)
Sodium: 143 mEq/L (ref 135–145)

## 2017-02-11 LAB — URINALYSIS
Bilirubin Urine: NEGATIVE
Hgb urine dipstick: NEGATIVE
KETONES UR: NEGATIVE
Leukocytes, UA: NEGATIVE
Nitrite: NEGATIVE
PH: 7 (ref 5.0–8.0)
TOTAL PROTEIN, URINE-UPE24: NEGATIVE
URINE GLUCOSE: NEGATIVE
Urobilinogen, UA: 0.2 (ref 0.0–1.0)

## 2017-02-11 LAB — TSH: TSH: 1.22 u[IU]/mL (ref 0.35–4.50)

## 2017-02-11 NOTE — Assessment & Plan Note (Signed)
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

## 2017-02-11 NOTE — Assessment & Plan Note (Signed)
On Vit D 

## 2017-02-11 NOTE — Assessment & Plan Note (Addendum)
We discussed age appropriate health related issues, including available/recomended screening tests and vaccinations. We discussed a need for adhering to healthy diet and exercise. Labs/EKG were reviewed/ordered. All questions were answered.  Declined all shots  

## 2017-02-11 NOTE — Assessment & Plan Note (Signed)
Better after chiropracic treatments

## 2017-02-11 NOTE — Progress Notes (Signed)
Pre-visit discussion using our clinic review tool. No additional management support is needed unless otherwise documented below in the visit note.  

## 2017-02-11 NOTE — Progress Notes (Signed)
Subjective:  Patient ID: Priscilla Hill, female    DOB: [birth year]  Age: 51 y.o. MRN: [id number]  CC: Annual Exam and Wrist Pain (right, dull pain, doesn't radiate )   HPI Priscilla Hill presents for well exam C/o   Outpatient Medications Prior to Visit  Medication Sig Dispense Refill  . b complex vitamins tablet Take 1 tablet by mouth daily.    Marland Kitchen VITAMIN D, CHOLECALCIFEROL, PO Take 1 tablet by mouth daily.    . meloxicam (MOBIC) 7.5 MG tablet Take 1-2 tablets (7.5-15 mg total) by mouth daily. 60 tablet 1  . omeprazole (PRILOSEC) 40 MG capsule TAKE ONE CAPSULE BY MOUTH ONCE DAILY 30 capsule 11   No facility-administered medications prior to visit.     ROS Review of Systems  Constitutional: Negative for activity change, appetite change, chills, fatigue and unexpected weight change.  HENT: Negative for congestion, mouth sores and sinus pressure.   Eyes: Negative for visual disturbance.  Respiratory: Negative for cough and chest tightness.   Gastrointestinal: Negative for abdominal pain and nausea.  Genitourinary: Negative for difficulty urinating, frequency and vaginal pain.  Musculoskeletal: Positive for arthralgias. Negative for back pain and gait problem.  Skin: Negative for pallor and rash.  Neurological: Negative for dizziness, tremors, weakness, numbness and headaches.  Psychiatric/Behavioral: Negative for confusion and sleep disturbance.    Objective:  BP 100/72   Pulse 78   Temp 99.3 F (37.4 C) (Oral)   Resp 16   Ht 5' 5.25" (1.657 m)   Wt 139 lb 4 oz (63.2 kg)   SpO2 97%   BMI 23.00 kg/m   BP Readings from Last 3 Encounters:  02/11/17 100/72  10/13/16 122/86  03/29/16 120/74    Wt Readings from Last 3 Encounters:  02/11/17 139 lb 4 oz (63.2 kg)  10/13/16 137 lb (62.1 kg)  03/29/16 132 lb 9.6 oz (60.1 kg)    Physical Exam  Constitutional: She appears well-developed. No distress.  HENT:  Head: Normocephalic.  Right Ear: External ear normal.  Left  Ear: External ear normal.  Nose: Nose normal.  Mouth/Throat: Oropharynx is clear and moist.  Eyes: Conjunctivae are normal. Pupils are equal, round, and reactive to light. Right eye exhibits no discharge. Left eye exhibits no discharge.  Neck: Normal range of motion. Neck supple. No JVD present. No tracheal deviation present. No thyromegaly present.  Cardiovascular: Normal rate, regular rhythm and normal heart sounds.   Pulmonary/Chest: No stridor. No respiratory distress. She has no wheezes.  Abdominal: Soft. Bowel sounds are normal. She exhibits no distension and no mass. There is no tenderness. There is no rebound and no guarding.  Musculoskeletal: She exhibits no edema or tenderness.  Lymphadenopathy:    She has no cervical adenopathy.  Neurological: She displays normal reflexes. No cranial nerve deficit. She exhibits normal muscle tone. Coordination normal.  Skin: No rash noted. No erythema.  Psychiatric: She has a normal mood and affect. Her behavior is normal. Judgment and thought content normal.    Lab Results  Component Value Date   WBC 6.4 01/16/2016   HGB 13.3 01/16/2016   HCT 39.7 01/16/2016   PLT 220.0 01/16/2016   GLUCOSE 82 03/24/2016   CHOL 56 01/16/2016   TRIG 39.0 01/16/2016   HDL 69.50 01/16/2016   LDLCALC -21 (L) 01/16/2016   ALT 16 01/16/2016   AST 15 01/16/2016   NA 144 03/24/2016   K 3.8 03/24/2016   CL 107 01/16/2016   CREATININE 0.8  03/24/2016   BUN 11.5 03/24/2016   CO2 28 03/24/2016   TSH 1.33 01/16/2016   INR 0.94 05/23/2013    Ct Chest W Contrast  Result Date: 03/24/2016 CLINICAL DATA:  Colon cancer diagnosed in 2014. Resection. Chemotherapy complete. Sigmoid primary. EXAM: CT CHEST, ABDOMEN, AND PELVIS WITH CONTRAST TECHNIQUE: Multidetector CT imaging of the chest, abdomen and pelvis was performed following the standard protocol during bolus administration of intravenous contrast. CONTRAST:  159mL ISOVUE-300 IOPAMIDOL (ISOVUE-300) INJECTION 61%  COMPARISON:  04/07/2015 FINDINGS: CT CHEST FINDINGS Mediastinum/Lymph Nodes: Normal heart size, without pericardial effusion. No central pulmonary embolism, on this non-dedicated study. No mediastinal or hilar adenopathy. Lungs/Pleura: No pleural fluid. Similar minimal subpleural right upper lobe nodularity on image 51/ series 4. Posterior right upper lobe subpleural nodularity on image 38/ series 4 felt to be similar, given differences in slice thickness. Musculoskeletal: No acute osseous abnormality. CT ABDOMEN PELVIS FINDINGS Hepatobiliary: Normal liver. Normal gallbladder, without biliary ductal dilatation. Pancreas: Normal, without mass or ductal dilatation. Spleen: Normal in size, without focal abnormality. Adrenals/Urinary Tract: Normal adrenal glands. Normal kidneys, without hydronephrosis. Normal urinary bladder. Stomach/Bowel: Normal stomach, without wall thickening. Colonic stool burden suggests constipation. No residual or recurrent disease within the sigmoid. Normal terminal ileum and appendix. Normal small bowel. Vascular/Lymphatic: Normal caliber of the aorta and branch vessels. No abdominopelvic adenopathy. Reproductive: Small uterine fundal fibroid of approximately 1.4 cm suspected on sagittal image 94. No adnexal mass. Other: No significant free fluid. No evidence of omental or peritoneal disease. Musculoskeletal: Minimal disc bulge at L5-S1. IMPRESSION: 1. No acute process or evidence of metastatic disease within the chest, abdomen, or pelvis. 2.  Possible constipation. 3. Small uterine fundal fibroid. Electronically Signed   By: Abigail Miyamoto M.D.   On: 03/24/2016 10:51   Ct Abdomen Pelvis W Contrast  Result Date: 03/24/2016 CLINICAL DATA:  Colon cancer diagnosed in 2014. Resection. Chemotherapy complete. Sigmoid primary. EXAM: CT CHEST, ABDOMEN, AND PELVIS WITH CONTRAST TECHNIQUE: Multidetector CT imaging of the chest, abdomen and pelvis was performed following the standard protocol during  bolus administration of intravenous contrast. CONTRAST:  13mL ISOVUE-300 IOPAMIDOL (ISOVUE-300) INJECTION 61% COMPARISON:  04/07/2015 FINDINGS: CT CHEST FINDINGS Mediastinum/Lymph Nodes: Normal heart size, without pericardial effusion. No central pulmonary embolism, on this non-dedicated study. No mediastinal or hilar adenopathy. Lungs/Pleura: No pleural fluid. Similar minimal subpleural right upper lobe nodularity on image 51/ series 4. Posterior right upper lobe subpleural nodularity on image 38/ series 4 felt to be similar, given differences in slice thickness. Musculoskeletal: No acute osseous abnormality. CT ABDOMEN PELVIS FINDINGS Hepatobiliary: Normal liver. Normal gallbladder, without biliary ductal dilatation. Pancreas: Normal, without mass or ductal dilatation. Spleen: Normal in size, without focal abnormality. Adrenals/Urinary Tract: Normal adrenal glands. Normal kidneys, without hydronephrosis. Normal urinary bladder. Stomach/Bowel: Normal stomach, without wall thickening. Colonic stool burden suggests constipation. No residual or recurrent disease within the sigmoid. Normal terminal ileum and appendix. Normal small bowel. Vascular/Lymphatic: Normal caliber of the aorta and branch vessels. No abdominopelvic adenopathy. Reproductive: Small uterine fundal fibroid of approximately 1.4 cm suspected on sagittal image 94. No adnexal mass. Other: No significant free fluid. No evidence of omental or peritoneal disease. Musculoskeletal: Minimal disc bulge at L5-S1. IMPRESSION: 1. No acute process or evidence of metastatic disease within the chest, abdomen, or pelvis. 2.  Possible constipation. 3. Small uterine fundal fibroid. Electronically Signed   By: Abigail Miyamoto M.D.   On: 03/24/2016 10:51    Assessment & Plan:   There are no  diagnoses linked to this encounter. I have discontinued Ms. Ekholm's meloxicam and omeprazole. I am also having her maintain her b complex vitamins and (VITAMIN D, CHOLECALCIFEROL,  PO).  No orders of the defined types were placed in this encounter.    Follow-up: No Follow-up on file.  Walker Kehr, MD

## 2017-02-17 ENCOUNTER — Encounter: Payer: Self-pay | Admitting: Gastroenterology

## 2017-03-01 NOTE — Progress Notes (Signed)
[facility] [address], [place] [address] Phone: [phone] Subjective:    I'm seeing this patient by the request  of:  Walker Kehr, MD   CC: Right wrist pain  [id number]  Priscilla Hill is a 51 y.o. female coming in with complaint of right wrist pain.  Patient states that hurts her with certain activity. Patient does do repetitive cleaning the kitchen and feels that that seems to aggravate it. Patient denies any numbness but states that the pain can be severe enough that stops her from activities. Patient does not remember nature injury. Rates the severity of pain a 7 out of 10. Patient does not use it is seems to get significantly better quickly.     Past Medical History:  Diagnosis Date  . Anemia   . Anxiety   . Cancer ([place])   . GERD (gastroesophageal reflux disease)   . H. pylori infection    positive IgG serology  . History of positive PPD    never any symptoms, due to being from [place]? different "vaccine"  . Hyperlipidemia    mild   Past Surgical History:  Procedure Laterality Date  . childbirth   04-17-13   x 2 NVD  . ESSURE TUBAL LIGATION    . HYSTEROSCOPY WITH RESECTOSCOPE  04-17-13   "fibroids removed"  . LAPAROSCOPIC PARTIAL COLECTOMY N/A 04/24/2013   Procedure: LAPAROSCOPIC ASSISTED SIGMOID COLECTOMY;  Surgeon: Odis Hollingshead, MD;  Location: [facility];  Service: General;  Laterality: N/A;  . PORTACATH PLACEMENT Right 05/24/2013   Procedure: INSERTION PORT-A-CATH;  Surgeon: Odis Hollingshead, MD;  Location: [facility];  Service: General;  Laterality: Right;   Social History   Social History  . Marital status: Married    Spouse name: Lennette Bihari  . Number of children: 2  . Years of education: N/A   Occupational History  . 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 Smoking status: Never Smoker  . Smokeless tobacco: Never Used  . Alcohol use 0.0 oz/week     Comment:  occasionally  . Drug use: No  . Sexual activity: Yes   Other Topics Concern  . Not on file   Social History Narrative   Married, husband Lennette Bihari   #1 small 28 yo in home and cares for her 29 year old mother   Both her and husband have #2 grown children each from prior marriage-her's are in [place] at [facility] in [place], [place] in ADL's and able to drive   Speaks English well   No Known Allergies Family History  Problem Relation Age of Onset  . Hypertension Mother   . Diabetes Mother   . Heart disease Mother   . Stroke Father 37  . Hypertension Father   . Heart disease Maternal Grandmother   . Heart disease Maternal Grandfather   . Stomach cancer      grandmother  . Colon cancer Neg Hx     Past medical history, social, surgical and family history all reviewed in electronic medical record.  No pertanent information unless stated regarding to the chief complaint.   Review of Systems:Review of systems updated and as accurate as of 03/01/17  No headache, visual changes, nausea, vomiting, diarrhea, constipation, dizziness, abdominal pain, skin rash, fevers, chills, night sweats, weight loss, swollen lymph nodes, body aches, joint swelling, muscle aches, chest pain,  shortness of breath, mood changes.   Objective  There were no vitals taken for this visit. Systems examined below as of 03/01/17   General: No apparent distress alert and oriented x3 mood and affect normal, dressed appropriately.  HEENT: Pupils equal, extraocular movements intact  Respiratory: Patient's speak in full sentences and does not appear short of breath  Cardiovascular: No lower extremity edema, non tender, no erythema  Skin: Warm dry intact with no signs of infection or rash on extremities or on axial skeleton.  Abdomen: Soft nontender  Neuro: Cranial nerves II through XII are intact, neurovascularly intact in all extremities with 2+ DTRs and 2+ pulses.  Lymph: No  lymphadenopathy of posterior or anterior cervical chain or axillae bilaterally.  Gait normal with good balance and coordination.  MSK:  Non tender with full range of motion and good stability and symmetric strength and tone of shoulders, elbows hip, knee and ankles bilaterally.  Wrist: Right Inspection normal with no visible erythema or swelling. Discomfort with ulnar deviation but does have full range of motion Pain over the ECU No snuffbox tenderness. No tenderness over Canal of Guyon. Strength 5/5 in all directions without pain. Negative Finkelstein, tinel's and phalens. Negative Watson's test.   MSK US performed of: Right wrist This study was ordered, performed, and interpreted by Charlann Boxer D.O.  Wrist: ECU tendon does have severe hypoechoic changes within the tendon sheath. Flexor carpi ulnaris tendon also has hypoechoic changes. No true tear appreciated. No effusion seen. TFCC intact. Scapholunate ligament intact. Carpal tunnel visualized and median nerve area normal, flexor tendons all normal in appearance without fraying, tears, or sheath effusions. Power doppler signal normal.  IMPRESSION:  Tendinitis of the ulnar aspect of the wrist  Procedure note 36468; 15 minutes spent for Therapeutic exercises as stated in above notes.  This included exercises focusing on stretching, strengthening, with significant focus on eccentric aspects.  Flexion and extension exercises working on supination and pronation with eccentric prescription given. Thera-Band given. Proper technique shown and discussed handout in great detail with ATC.  All questions were discussed and answered.      Impression and Recommendations:     This case required medical decision making of moderate complexity.      Note: This dictation was prepared with Dragon dictation along with smaller phrase technology. Any transcriptional errors that result from this process are unintentional.

## 2017-03-02 ENCOUNTER — Encounter: Payer: Self-pay | Admitting: Family Medicine

## 2017-03-02 ENCOUNTER — Ambulatory Visit: Payer: Self-pay

## 2017-03-02 ENCOUNTER — Ambulatory Visit (INDEPENDENT_AMBULATORY_CARE_PROVIDER_SITE_OTHER): Payer: PRIVATE HEALTH INSURANCE | Admitting: Family Medicine

## 2017-03-02 VITALS — BP 110/80 | HR 78 | Resp 16 | Wt 139.4 lb

## 2017-03-02 DIAGNOSIS — S63091A Other subluxation of right wrist and hand, initial encounter: Secondary | ICD-10-CM

## 2017-03-02 DIAGNOSIS — S63094A Other dislocation of right wrist and hand, initial encounter: Secondary | ICD-10-CM

## 2017-03-02 DIAGNOSIS — M25539 Pain in unspecified wrist: Secondary | ICD-10-CM | POA: Diagnosis not present

## 2017-03-02 MED ORDER — DICLOFENAC SODIUM 2 % TD SOLN: 2.0000 "application " | Freq: Two times a day (BID) | TRANSDERMAL | 3 refills | Status: DC

## 2017-03-02 NOTE — Progress Notes (Signed)
Pre-visit discussion using our clinic review tool. No additional management support is needed unless otherwise documented below in the visit note.  

## 2017-03-02 NOTE — Assessment & Plan Note (Signed)
I believe the patient did have a subluxation and now has a recurrent tendinitis. Patient does not state that she is having any significant numbness or weakness noted today. Discussed with patient about proper lifting mechanics, proper bracing, which activities to do a which ones to avoid. Patient will increase activity as tolerated. Follow-up again in 3 weeks.

## 2017-03-02 NOTE — Patient Instructions (Addendum)
Good to see you.  Ice 20 minutes 2 times daily. Usually after activity and before bed. Exercises 3 times a week.  Wear brace day and night for 1 week and then consider nightly for 2 weeks.  pennsaid pinkie amount topically 2 times daily as needed.  Do not lift underhand if you can  For the feet try Spenco orthotics "total support" online would be great  Do not lace the last shoe lace  See me again in 3 weeks.

## 2017-03-10 ENCOUNTER — Encounter: Payer: Self-pay | Admitting: Gastroenterology

## 2017-03-24 NOTE — Progress Notes (Signed)
[name] [address], [place] [address] Phone: [phone] Subjective:    I'm seeing this patient by the request  of:  Walker Kehr, MD   CC: Right wrist pain f/u  [id number]  Priscilla Hill is a 51 y.o. female coming in with complaint of right wrist pain.  Patient sent have extensor carpi ulnaris tendinitis. Patient was to do bracing, home exercises, icing regimen. Patient states Doing much better. States that 50-60% better. Denies any numbness or weakness. Patient denies any new injuries. Patient states that she is able to do daily activities. Feels that the brace has been helping out significantly.     Past Medical History:  Diagnosis Date  . Anemia   . Anxiety   . Cancer ([place])   . GERD (gastroesophageal reflux disease)   . H. pylori infection    positive IgG serology  . History of positive PPD    never any symptoms, due to being from [place]? different "vaccine"  . Hyperlipidemia    mild   Past Surgical History:  Procedure Laterality Date  . childbirth   04-17-13   x 2 NVD  . ESSURE TUBAL LIGATION    . HYSTEROSCOPY WITH RESECTOSCOPE  04-17-13   "fibroids removed"  . LAPAROSCOPIC PARTIAL COLECTOMY N/A 04/24/2013   Procedure: LAPAROSCOPIC ASSISTED SIGMOID COLECTOMY;  Surgeon: Odis Hollingshead, MD;  Location: [facility];  Service: General;  Laterality: N/A;  . PORTACATH PLACEMENT Right 05/24/2013   Procedure: INSERTION PORT-A-CATH;  Surgeon: Odis Hollingshead, MD;  Location: [facility];  Service: General;  Laterality: Right;   Social History   Social History  . Marital status: Married    Spouse name: Lennette Bihari  . Number of children: 2  . Years of education: N/A   Occupational History  . 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 Smoking status: Never Smoker  . Smokeless tobacco: Never Used  . Alcohol use 0.0 oz/week     Comment: occasionally  . Drug use: No  . Sexual activity: Yes    Other Topics Concern  . None   Social History Narrative   Married, husband Lennette Bihari   #1 small 24 yo in home and cares for her 80 year old mother   Both her and husband have #2 grown children each from prior marriage-her's are in [place] at [facility] [employer] [facility] in [place], [place] in ADL's and able to drive   Speaks English well   No Known Allergies Family History  Problem Relation Age of Onset  . Hypertension Mother   . Diabetes Mother   . Heart disease Mother   . Stroke Father 55  . Hypertension Father   . Heart disease Maternal Grandmother   . Heart disease Maternal Grandfather   . Stomach cancer      grandmother  . Colon cancer Neg Hx     Past medical history, social, surgical and family history all reviewed in electronic medical record.  No pertanent information unless stated regarding to the chief complaint.   Review of Systems: No headache, visual changes, nausea, vomiting, diarrhea, constipation, dizziness, abdominal pain, skin rash, fevers, chills, night sweats, weight loss, swollen lymph nodes, body aches, joint swelling, muscle aches, chest pain, shortness of breath, mood changes.     Objective  Blood pressure 118/61, pulse 81, resp. rate 16, weight 139 lb 8 oz (63.3 kg),  SpO2 98 %. Systems examined below as of 03/25/17   Systems examined below as of 03/25/17 General: NAD A&O x3 mood, affect normal  HEENT: Pupils equal, extraocular movements intact no nystagmus Respiratory: not short of breath at rest or with speaking Cardiovascular: No lower extremity edema, non tender Skin: Warm dry intact with no signs of infection or rash on extremities or on axial skeleton. Abdomen: Soft nontender, no masses Neuro: Cranial nerves  intact, neurovascularly intact in all extremities with 2+ DTRs and 2+ pulses. Lymph: No lymphadenopathy appreciated today  Gait normal with good balance and coordination.  MSK: Non tender with full range of motion and  good stability and symmetric strength and tone of shoulders, elbows,  knee hips and ankles bilaterally.   Wrist: Right Inspection normal with no visible erythema or swelling. ROM smooth and normal with good flexion and extension and ulnar/radial deviation that is symmetrical with opposite wrist. Minimal pain over the ulnar aspect No snuffbox tenderness. No tenderness over Canal of Guyon. Strength 5/5 in all directions without pain. Negative Finkelstein, tinel's and phalens. Negative Watson's test.      Impression and Recommendations:     This case required medical decision making of moderate complexity.      Note: This dictation was prepared with Dragon dictation along with smaller phrase technology. Any transcriptional errors that result from this process are unintentional.

## 2017-03-25 ENCOUNTER — Ambulatory Visit (INDEPENDENT_AMBULATORY_CARE_PROVIDER_SITE_OTHER): Payer: PRIVATE HEALTH INSURANCE | Admitting: Family Medicine

## 2017-03-25 ENCOUNTER — Encounter: Payer: Self-pay | Admitting: Family Medicine

## 2017-03-25 DIAGNOSIS — S63094A Other dislocation of right wrist and hand, initial encounter: Secondary | ICD-10-CM

## 2017-03-25 DIAGNOSIS — S63091A Other subluxation of right wrist and hand, initial encounter: Secondary | ICD-10-CM

## 2017-03-25 NOTE — Assessment & Plan Note (Signed)
Seems significantly better at this time. Discussed with patient at great length. Patient doing well. Patient will continue with conservative therapy and will start to wear the brace less  RTC in j6-8 weeks.

## 2017-03-25 NOTE — Patient Instructions (Signed)
Good to see you You are doing great  Ice is your friend.  Try to tape the wrist instead of the brace Use the orthotics for 2 hours first day and increase 1 hour a day  Otherwise look into HOKA or Xelero shoes.  Continue the wrist exercises 2-3 times a week  See me again in 6-8 weeks.

## 2017-03-30 ENCOUNTER — Other Ambulatory Visit: Payer: PRIVATE HEALTH INSURANCE

## 2017-03-30 ENCOUNTER — Telehealth: Payer: Self-pay | Admitting: *Deleted

## 2017-03-30 ENCOUNTER — Telehealth: Payer: Self-pay | Admitting: Nurse Practitioner

## 2017-03-30 ENCOUNTER — Ambulatory Visit (HOSPITAL_BASED_OUTPATIENT_CLINIC_OR_DEPARTMENT_OTHER): Payer: PRIVATE HEALTH INSURANCE | Admitting: Nurse Practitioner

## 2017-03-30 VITALS — BP 106/73 | HR 64 | Temp 98.5°F | Resp 18 | Ht 65.0 in | Wt 139.1 lb

## 2017-03-30 DIAGNOSIS — Z85038 Personal history of other malignant neoplasm of large intestine: Secondary | ICD-10-CM | POA: Diagnosis not present

## 2017-03-30 DIAGNOSIS — C187 Malignant neoplasm of sigmoid colon: Secondary | ICD-10-CM

## 2017-03-30 LAB — CEA (IN HOUSE-CHCC)

## 2017-03-30 NOTE — Telephone Encounter (Signed)
Gave patient AVS and calender per 5/9 los.  

## 2017-03-30 NOTE — Telephone Encounter (Signed)
-----   Message from Owens Shark, NP sent at 03/30/2017  1:43 PM EDT ----- Please let her know CEA is normal.

## 2017-03-30 NOTE — Telephone Encounter (Signed)
Telephone call to patient to advise lab results. Left message for patient to return call

## 2017-03-30 NOTE — Progress Notes (Signed)
  [facility] OFFICE PROGRESS NOTE   Diagnosis:  Colon cancer  INTERVAL HISTORY:   Priscilla Hill returns as scheduled. She feels well. No change in bowel habits. She has occasional discomfort at the right low abdomen. She reports she is scheduled for a colonoscopy in June of this year. She continues to note a small amount of blood on the toilet tissue intermittently after a bowel movement. She has persistent mild foot numbness. This does not interfere with activity.  Objective:  Vital signs in last 24 hours:  Blood pressure 106/73, pulse 64, temperature 98.5 F (36.9 C), temperature source Oral, resp. rate 18, height _0  (1.651 m), weight 139 lb 1.6 oz (63.1 kg), SpO2 100 %.    HEENT: Neck without mass. Lymphatics: No palpable cervical, supra clavicular, axillary or inguinal lymph nodes. Resp: Lungs clear bilaterally. Cardio: Regular rate and rhythm. GI: Abdomen soft and nontender. No hepatomegaly. No mass. Vascular: No leg edema.   Lab Results:  Lab Results  Component Value Date   WBC 5.4 02/11/2017   HGB 13.0 02/11/2017   HCT 38.6 02/11/2017   MCV 90.9 02/11/2017   PLT 201.0 02/11/2017   NEUTROABS 3.6 02/11/2017    Imaging:  No results found.  Medications: I have reviewed the patient's current medications.  Assessment/Plan: 1. 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 The tumor returned microsatellite stable with no loss of mismatch repair protein expression.  Cycle 1 adjuvant FOLFOX chemotherapy 05/28/2013.   Oxaliplatin was held beginning with cycle 8 due to neuropathy symptoms.   Final cycle of 5-fluorouracil/leucovorin given on 10/29/2013.  03/04/2014 CEA less than 0.5.  04/02/2014 CT chest/abdomen/pelvis negative for metastatic disease.  Colonoscopy 04/17/2014-negative  04/07/2015 CT chest/abdomen/pelvis negative for metastatic disease  CTs of the chest,  abdomen, and pelvis 03/24/2016-negative for recurrent colon cancer 2. History of microcytic anemia.  3. Port-A-Cath placement 05/24/2013. 4. History of uterine fibroids. 5. Nausea following cycle 1 FOLFOX. Aloxi and prophylactic Decadron were added with cycle 2. She had nausea following cycle 2. Emend was added with cycle 3. Improved. No nausea following cycle 5 or 6. 6. Mild neutropenia secondary chemotherapy. She received Neulasta beginning with cycle 4 FOLFOX  7. Abdominal discomfort following cycle 4 FOLFOX. Question gastritis related to steroids. She began Prilosec 20 mg daily. 8. History of mild thrombocytopenia secondary chemotherapy. 9. Pruritus and erythema over the palms beginning day 4 following cycle 5 FOLFOX and lasting approximately 3 days. Question hand-foot syndrome related to 5-fluorouracil, question allergic reaction. She was instructed to contact the office if she experiences this again. 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 Persistent numbness in the feet -improved 11. Soft mobile left axillary lymph node on exam 10/07/2014. Stable on exam 04/09/2015. Not noted on exam 10/06/2015 or 03/29/2016    Disposition: Priscilla Hill remains in clinical remission from colon cancer. We will follow-up on the CEA from today.   She is scheduled for a colonoscopy next month. She will follow-up with Dr. Havery Moros regarding the blood with bowel movements.  She will return for a CEA in 6 months and a follow-up visit/CEA in one year. She will contact the office in the interim with any problems.  I encouraged her to get up-to-date on other screenings (mammogram and Pap/pelvic exam).  Plan reviewed with Dr. Benay Spice.    Ned Card ANP/GNP-BC   03/30/2017  10:29 AM

## 2017-03-31 LAB — CEA: CEA: 1 ng/mL (ref 0.0–4.7)

## 2017-04-27 ENCOUNTER — Ambulatory Visit (AMBULATORY_SURGERY_CENTER): Payer: Self-pay

## 2017-04-27 VITALS — Ht 66.0 in | Wt 140.0 lb

## 2017-04-27 DIAGNOSIS — Z85038 Personal history of other malignant neoplasm of large intestine: Secondary | ICD-10-CM

## 2017-04-27 MED ORDER — SUPREP BOWEL PREP KIT 17.5-3.13-1.6 GM/177ML PO SOLN: 1.0000 | Freq: Once | ORAL | 0 refills | Status: AC

## 2017-04-27 NOTE — Progress Notes (Signed)
No allergies to eggs or soy No diet meds No home oxygen PONV with gerneal anesthesia  Declined emmi

## 2017-05-12 ENCOUNTER — Encounter: Payer: Self-pay | Admitting: Gastroenterology

## 2017-05-20 ENCOUNTER — Encounter: Payer: Self-pay | Admitting: Internal Medicine

## 2017-05-20 ENCOUNTER — Ambulatory Visit (AMBULATORY_SURGERY_CENTER): Payer: PRIVATE HEALTH INSURANCE | Admitting: Gastroenterology

## 2017-05-20 ENCOUNTER — Encounter: Payer: Self-pay | Admitting: Gastroenterology

## 2017-05-20 VITALS — BP 100/66 | HR 56 | Temp 99.1°F | Resp 10 | Ht 66.0 in | Wt 140.0 lb

## 2017-05-20 DIAGNOSIS — D123 Benign neoplasm of transverse colon: Secondary | ICD-10-CM

## 2017-05-20 DIAGNOSIS — Z85038 Personal history of other malignant neoplasm of large intestine: Secondary | ICD-10-CM

## 2017-05-20 DIAGNOSIS — D122 Benign neoplasm of ascending colon: Secondary | ICD-10-CM | POA: Diagnosis not present

## 2017-05-20 MED ORDER — SODIUM CHLORIDE 0.9 % IV SOLN: 500.0000 mL | INTRAVENOUS | Status: DC

## 2017-05-20 NOTE — Progress Notes (Signed)
Pt's states no medical or surgical changes since previsit or office visit. 

## 2017-05-20 NOTE — Op Note (Signed)
[place] Patient Name: Priscilla Hill Procedure Date: 05/20/2017 1:03 PM MRN: [id number] Endoscopist: Remo Lipps P. Maralyn Witherell MD, MD Age: 51 Referring MD:  Date of Birth: 1966/08/12 Gender: Female Account #: [id number] Procedure:                Colonoscopy Indications:              Personal history of malignant neoplasm of the                            colon. Last colonoscopy 3 years ago Medicines:                Monitored Anesthesia Care Procedure:                Pre-Anesthesia Assessment:                           - Prior to the procedure, a History and Physical                            was performed, and patient medications and                            allergies were reviewed. The patient's tolerance of                            previous anesthesia was also reviewed. The risks                            and benefits of the procedure and the sedation                            options and risks were discussed with the patient.                            All questions were answered, and informed consent                            was obtained. Prior Anticoagulants: The patient has                            taken no previous anticoagulant or antiplatelet                            agents. ASA Grade Assessment: II - A patient with                            mild systemic disease. After reviewing the risks                            and benefits, the patient was deemed in                            satisfactory condition to undergo the procedure.  After obtaining informed consent, the colonoscope                            was passed under direct vision. Throughout the                            procedure, the patient's blood pressure, pulse, and                            oxygen saturations were monitored continuously. The                            Colonoscope was introduced through the anus and                            advanced to the the  terminal ileum, with                            identification of the appendiceal orifice and IC                            valve. The colonoscopy was performed without                            difficulty. The patient tolerated the procedure                            well. The quality of the bowel preparation was                            good. The terminal ileum, ileocecal valve,                            appendiceal orifice, and rectum were photographed. Scope In: 1:29:22 PM Scope Out: 1:46:44 PM Scope Withdrawal Time: 0 hours 14 minutes 59 seconds  Total Procedure Duration: 0 hours 17 minutes 22 seconds  Findings:                 The perianal and digital rectal examinations were                            normal.                           The terminal ileum appeared normal.                           A 8 mm polyp was found in the ascending colon. The                            polyp was flat. The polyp was removed with a cold                            snare. Resection and retrieval were complete.  A 5 mm polyp was found in the transverse colon. The                            polyp was flat. The polyp was removed with a cold                            snare. Resection and retrieval were complete.                           There was evidence of a prior end-to-end                            colo-colonic anastomosis in the sigmoid colon. This                            was patent and was characterized by healthy                            appearing mucosa.                           Anal papilla(e) were hypertrophied.                           The exam was otherwise without abnormality. Complications:            No immediate complications. Estimated blood loss:                            Minimal. Estimated Blood Loss:     Estimated blood loss was minimal. Impression:               - The examined portion of the ileum was normal.                           - One  8 mm polyp in the ascending colon, removed                            with a cold snare. Resected and retrieved.                           - One 5 mm polyp in the transverse colon, removed                            with a cold snare. Resected and retrieved.                           - Patent end-to-end colo-colonic anastomosis,                            characterized by healthy appearing mucosa.                           - Anal papilla(e) were hypertrophied.                           -  The examination was otherwise normal. Recommendation:           - Patient has a contact number available for                            emergencies. The signs and symptoms of potential                            delayed complications were discussed with the                            patient. Return to normal activities tomorrow.                            Written discharge instructions were provided to the                            patient.                           - Resume previous diet.                           - Continue present medications.                           - Await pathology results.                           - Repeat colonoscopy is recommended for                            surveillance. The colonoscopy date will be                            determined after pathology results from today's                            exam become available for review.                           - No ibuprofen, naproxen, or other non-steroidal                            anti-inflammatory drugs for 2 weeks after polyp                            removal. Remo Lipps P. Tersa Fotopoulos MD, MD 05/20/2017 1:52:00 PM This report has been signed electronically.

## 2017-05-20 NOTE — Progress Notes (Signed)
Called to room to assist during endoscopic procedure.  Patient ID and intended procedure confirmed with present staff. Received instructions for my participation in the procedure from the performing physician.  

## 2017-05-20 NOTE — Progress Notes (Signed)
Alert and oriented x3, pleased with MAC, report to rn

## 2017-05-20 NOTE — Patient Instructions (Signed)
Handout given on polyps  YOU HAD AN ENDOSCOPIC PROCEDURE TODAY: Refer to the procedure report and other information in the discharge instructions given to you for any specific questions about what was found during the examination. If this information does not answer your questions, please call [place] office at [phone] to clarify.   YOU SHOULD EXPECT: Some feelings of bloating in the abdomen. Passage of more gas than usual. Walking can help get rid of the air that was put into your GI tract during the procedure and reduce the bloating. If you had a lower endoscopy (such as a colonoscopy or flexible sigmoidoscopy) you may notice spotting of blood in your stool or on the toilet paper. Some abdominal soreness may be present for a day or two, also.  DIET: Your first meal following the procedure should be a light meal and then it is ok to progress to your normal diet. A half-sandwich or bowl of soup is an example of a good first meal. Heavy or fried foods are harder to digest and may make you feel nauseous or bloated. Drink plenty of fluids but you should avoid alcoholic beverages for 24 hours. If you had a esophageal dilation, please see attached instructions for diet.    ACTIVITY: Your care partner should take you home directly after the procedure. You should plan to take it easy, moving slowly for the rest of the day. You can resume normal activity the day after the procedure however YOU SHOULD NOT DRIVE, use power tools, machinery or perform tasks that involve climbing or major physical exertion for 24 hours (because of the sedation medicines used during the test).   SYMPTOMS TO REPORT IMMEDIATELY: A gastroenterologist can be reached at any hour. Please call [phone]  for any of the following symptoms:  Following lower endoscopy (colonoscopy, flexible sigmoidoscopy) Excessive amounts of blood in the stool  Significant tenderness, worsening of abdominal pains  Swelling of the abdomen that is  new, acute  Fever of 100 or higher    FOLLOW UP:  If any biopsies were taken you will be contacted by phone or by letter within the next 1-3 weeks. Call [phone]  if you have not heard about the biopsies in 3 weeks.  Please also call with any specific questions about appointments or follow up tests.  

## 2017-05-23 ENCOUNTER — Ambulatory Visit: Payer: Self-pay | Admitting: Family Medicine

## 2017-05-23 ENCOUNTER — Telehealth: Payer: Self-pay

## 2017-05-23 NOTE — Telephone Encounter (Signed)
  Follow up Call-  Call back number 05/20/2017 01/09/2016  Post procedure Call Back phone  # [phone]  Permission to leave phone message Yes Yes  Some recent data might be hidden     Left message

## 2017-05-23 NOTE — Telephone Encounter (Signed)
  Follow up Call-  Call back number 05/20/2017 01/09/2016  Post procedure Call Back phone  # [phone]  Permission to leave phone message Yes Yes  Some recent data might be hidden     Patient questions:  Do you have a fever, pain , or abdominal swelling? No. Pain Score  0 *  Have you tolerated food without any problems? Yes.    Have you been able to return to your normal activities? Yes.    Do you have any questions about your discharge instructions: Diet   No. Medications  No. Follow up visit  No.  Do you have questions or concerns about your Care? No.  Actions: * If pain score is 4 or above: No action needed, pain <4.

## 2017-05-27 ENCOUNTER — Encounter: Payer: Self-pay | Admitting: Gastroenterology

## 2017-09-20 ENCOUNTER — Ambulatory Visit: Payer: Self-pay | Admitting: Internal Medicine

## 2017-09-21 ENCOUNTER — Ambulatory Visit: Payer: Self-pay | Admitting: Internal Medicine

## 2017-10-07 ENCOUNTER — Telehealth: Payer: Self-pay | Admitting: Oncology

## 2017-10-07 ENCOUNTER — Other Ambulatory Visit (HOSPITAL_BASED_OUTPATIENT_CLINIC_OR_DEPARTMENT_OTHER): Payer: PRIVATE HEALTH INSURANCE

## 2017-10-07 ENCOUNTER — Ambulatory Visit: Payer: Self-pay | Admitting: Internal Medicine

## 2017-10-07 DIAGNOSIS — C187 Malignant neoplasm of sigmoid colon: Secondary | ICD-10-CM

## 2017-10-07 DIAGNOSIS — Z85038 Personal history of other malignant neoplasm of large intestine: Secondary | ICD-10-CM | POA: Diagnosis not present

## 2017-10-07 LAB — CEA (IN HOUSE-CHCC): CEA (CHCC-IN HOUSE): 1.16 ng/mL (ref 0.00–5.00)

## 2017-10-07 NOTE — Telephone Encounter (Signed)
Patient came in to reschedule her appointment because she has to work.

## 2017-10-20 ENCOUNTER — Telehealth: Payer: Self-pay | Admitting: Emergency Medicine

## 2017-10-20 NOTE — Telephone Encounter (Addendum)
VM left regarding this note.   ----- Message from Owens Shark, NP sent at 10/20/2017  8:38 AM EST ----- Please let her know the CEA is normal.

## 2018-01-16 ENCOUNTER — Encounter: Payer: PRIVATE HEALTH INSURANCE | Admitting: Internal Medicine

## 2018-01-20 ENCOUNTER — Ambulatory Visit: Payer: PRIVATE HEALTH INSURANCE | Admitting: Internal Medicine

## 2018-01-20 ENCOUNTER — Encounter: Payer: Self-pay | Admitting: Internal Medicine

## 2018-01-20 DIAGNOSIS — B351 Tinea unguium: Secondary | ICD-10-CM

## 2018-01-20 DIAGNOSIS — H60311 Diffuse otitis externa, right ear: Secondary | ICD-10-CM | POA: Diagnosis not present

## 2018-01-20 DIAGNOSIS — H6090 Unspecified otitis externa, unspecified ear: Secondary | ICD-10-CM | POA: Insufficient documentation

## 2018-01-20 MED ORDER — CICLOPIROX 8 % EX SOLN: Freq: Every day | CUTANEOUS | 0 refills | Status: DC

## 2018-01-20 MED ORDER — NEOMYCIN-POLYMYXIN-HC 3.5-10000-1 OT SOLN: 3.0000 [drp] | Freq: Four times a day (QID) | OTIC | 3 refills | Status: AC

## 2018-01-20 MED ORDER — OMEPRAZOLE 40 MG PO CPDR: 40.0000 mg | DELAYED_RELEASE_CAPSULE | Freq: Every day | ORAL | 11 refills | Status: DC

## 2018-01-20 NOTE — Progress Notes (Signed)
Subjective:  Patient ID: Priscilla Hill, female    DOB: 1966/09/13  Age: 52 y.o. MRN: [id number]  CC: No chief complaint on file.   HPI ANALEE MONTEE presents for R ear and R scalp pain off and on C/o L CP irad to the L scapula x 1 episode 1 mo ago C/o GERD - on Omeprazole C/o toenail fu  Outpatient Medications Prior to Visit  Medication Sig Dispense Refill  . b complex vitamins tablet Take 1 tablet by mouth daily.    Marland Kitchen VITAMIN D, CHOLECALCIFEROL, PO Take 1 tablet by mouth daily.    Marland Kitchen BLACK COHOSH PO Take by mouth.    . Diclofenac Sodium (PENNSAID) 2 % SOLN Place 2 application onto the skin 2 (two) times daily. (Patient not taking: Reported on 01/20/2018) 112 g 3   Facility-Administered Medications Prior to Visit  Medication Dose Route Frequency Provider Last Rate Last Dose  . 0.9 %  sodium chloride infusion  500 mL Intravenous Continuous Armbruster, Carlota Raspberry, MD        ROS Review of Systems  Constitutional: Negative for activity change, appetite change, chills, fatigue and unexpected weight change.  HENT: Positive for ear discharge and ear pain. Negative for congestion, mouth sores and sinus pressure.   Eyes: Negative for visual disturbance.  Respiratory: Negative for cough and chest tightness.   Gastrointestinal: Negative for abdominal pain and nausea.  Genitourinary: Negative for difficulty urinating, frequency and vaginal pain.  Musculoskeletal: Negative for back pain and gait problem.  Skin: Negative for pallor and rash.  Neurological: Negative for dizziness, tremors, weakness, numbness and headaches.  Psychiatric/Behavioral: Negative for confusion and sleep disturbance.    Objective:  BP 106/68 (BP Location: Left Arm, Patient Position: Sitting, Cuff Size: Normal)   Pulse 79   Temp 98.8 F (37.1 C) (Oral)   Ht 5\' 6"  (1.676 m)   Wt 141 lb (64 kg)   SpO2 98%   BMI 22.76 kg/m   BP Readings from Last 3 Encounters:  01/20/18 106/68  05/20/17 100/66  03/30/17 106/73      Wt Readings from Last 3 Encounters:  01/20/18 141 lb (64 kg)  05/20/17 140 lb (63.5 kg)  04/27/17 140 lb (63.5 kg)    Physical Exam  Constitutional: She appears well-developed. No distress.  HENT:  Head: Normocephalic.  Right Ear: External ear normal.  Left Ear: External ear normal.  Nose: Nose normal.  Mouth/Throat: Oropharynx is clear and moist.  Eyes: Conjunctivae are normal. Pupils are equal, round, and reactive to light. Right eye exhibits no discharge. Left eye exhibits no discharge.  Neck: Normal range of motion. Neck supple. No JVD present. No tracheal deviation present. No thyromegaly present.  Cardiovascular: Normal rate, regular rhythm and normal heart sounds.  Pulmonary/Chest: No stridor. No respiratory distress. She has no wheezes.  Abdominal: Soft. Bowel sounds are normal. She exhibits no distension and no mass. There is no tenderness. There is no rebound and no guarding.  Musculoskeletal: She exhibits no edema or tenderness.  Lymphadenopathy:    She has no cervical adenopathy.  Neurological: She displays normal reflexes. No cranial nerve deficit. She exhibits normal muscle tone. Coordination normal.  Skin: No rash noted. No erythema.  Psychiatric: She has a normal mood and affect. Her behavior is normal. Judgment and thought content normal.   R toenails w/fungus R ear w/OE and wight/grey wax   Lab Results  Component Value Date   WBC 5.4 02/11/2017   HGB 13.0 02/11/2017  HCT 38.6 02/11/2017   PLT 201.0 02/11/2017   GLUCOSE 88 02/11/2017   CHOL 196 02/11/2017   TRIG 103.0 02/11/2017   HDL 52.10 02/11/2017   LDLCALC 123 (H) 02/11/2017   ALT 14 02/11/2017   AST 20 02/11/2017   NA 143 02/11/2017   K 3.9 02/11/2017   CL 107 02/11/2017   CREATININE 0.67 02/11/2017   BUN 9 02/11/2017   CO2 27 02/11/2017   TSH 1.22 02/11/2017   INR 0.94 05/23/2013    Ct Chest W Contrast  Result Date: 03/24/2016 CLINICAL DATA:  Colon cancer diagnosed in 2014.  Resection. Chemotherapy complete. Sigmoid primary. EXAM: CT CHEST, ABDOMEN, AND PELVIS WITH CONTRAST TECHNIQUE: Multidetector CT imaging of the chest, abdomen and pelvis was performed following the standard protocol during bolus administration of intravenous contrast. CONTRAST:  164mL ISOVUE-300 IOPAMIDOL (ISOVUE-300) INJECTION 61% COMPARISON:  04/07/2015 FINDINGS: CT CHEST FINDINGS Mediastinum/Lymph Nodes: Normal heart size, without pericardial effusion. No central pulmonary embolism, on this non-dedicated study. No mediastinal or hilar adenopathy. Lungs/Pleura: No pleural fluid. Similar minimal subpleural right upper lobe nodularity on image 51/ series 4. Posterior right upper lobe subpleural nodularity on image 38/ series 4 felt to be similar, given differences in slice thickness. Musculoskeletal: No acute osseous abnormality. CT ABDOMEN PELVIS FINDINGS Hepatobiliary: Normal liver. Normal gallbladder, without biliary ductal dilatation. Pancreas: Normal, without mass or ductal dilatation. Spleen: Normal in size, without focal abnormality. Adrenals/Urinary Tract: Normal adrenal glands. Normal kidneys, without hydronephrosis. Normal urinary bladder. Stomach/Bowel: Normal stomach, without wall thickening. Colonic stool burden suggests constipation. No residual or recurrent disease within the sigmoid. Normal terminal ileum and appendix. Normal small bowel. Vascular/Lymphatic: Normal caliber of the aorta and branch vessels. No abdominopelvic adenopathy. Reproductive: Small uterine fundal fibroid of approximately 1.4 cm suspected on sagittal image 94. No adnexal mass. Other: No significant free fluid. No evidence of omental or peritoneal disease. Musculoskeletal: Minimal disc bulge at L5-S1. IMPRESSION: 1. No acute process or evidence of metastatic disease within the chest, abdomen, or pelvis. 2.  Possible constipation. 3. Small uterine fundal fibroid. Electronically Signed   By: Abigail Miyamoto M.D.   On: 03/24/2016 10:51    Ct Abdomen Pelvis W Contrast  Result Date: 03/24/2016 CLINICAL DATA:  Colon cancer diagnosed in 2014. Resection. Chemotherapy complete. Sigmoid primary. EXAM: CT CHEST, ABDOMEN, AND PELVIS WITH CONTRAST TECHNIQUE: Multidetector CT imaging of the chest, abdomen and pelvis was performed following the standard protocol during bolus administration of intravenous contrast. CONTRAST:  124mL ISOVUE-300 IOPAMIDOL (ISOVUE-300) INJECTION 61% COMPARISON:  04/07/2015 FINDINGS: CT CHEST FINDINGS Mediastinum/Lymph Nodes: Normal heart size, without pericardial effusion. No central pulmonary embolism, on this non-dedicated study. No mediastinal or hilar adenopathy. Lungs/Pleura: No pleural fluid. Similar minimal subpleural right upper lobe nodularity on image 51/ series 4. Posterior right upper lobe subpleural nodularity on image 38/ series 4 felt to be similar, given differences in slice thickness. Musculoskeletal: No acute osseous abnormality. CT ABDOMEN PELVIS FINDINGS Hepatobiliary: Normal liver. Normal gallbladder, without biliary ductal dilatation. Pancreas: Normal, without mass or ductal dilatation. Spleen: Normal in size, without focal abnormality. Adrenals/Urinary Tract: Normal adrenal glands. Normal kidneys, without hydronephrosis. Normal urinary bladder. Stomach/Bowel: Normal stomach, without wall thickening. Colonic stool burden suggests constipation. No residual or recurrent disease within the sigmoid. Normal terminal ileum and appendix. Normal small bowel. Vascular/Lymphatic: Normal caliber of the aorta and branch vessels. No abdominopelvic adenopathy. Reproductive: Small uterine fundal fibroid of approximately 1.4 cm suspected on sagittal image 94. No adnexal mass. Other: No significant free fluid.  No evidence of omental or peritoneal disease. Musculoskeletal: Minimal disc bulge at L5-S1. IMPRESSION: 1. No acute process or evidence of metastatic disease within the chest, abdomen, or pelvis. 2.  Possible  constipation. 3. Small uterine fundal fibroid. Electronically Signed   By: Abigail Miyamoto M.D.   On: 03/24/2016 10:51    Assessment & Plan:   There are no diagnoses linked to this encounter. I have discontinued Jacqualine Mau. Matulich's Diclofenac Sodium and BLACK COHOSH PO. I am also having her maintain her b complex vitamins and (VITAMIN D, CHOLECALCIFEROL, PO). We will continue to administer sodium chloride.  No orders of the defined types were placed in this encounter.    Follow-up: No Follow-up on file.  Walker Kehr, MD

## 2018-01-20 NOTE — Patient Instructions (Signed)
Excedrin for headaches

## 2018-01-20 NOTE — Assessment & Plan Note (Signed)
Cortisporin otic prn Irrigate q 3 mo Cotton ball in the shower

## 2018-01-20 NOTE — Assessment & Plan Note (Signed)
penlac  

## 2018-02-13 ENCOUNTER — Ambulatory Visit (INDEPENDENT_AMBULATORY_CARE_PROVIDER_SITE_OTHER): Payer: PRIVATE HEALTH INSURANCE | Admitting: Internal Medicine

## 2018-02-13 ENCOUNTER — Encounter: Payer: Self-pay | Admitting: Internal Medicine

## 2018-02-13 ENCOUNTER — Other Ambulatory Visit (INDEPENDENT_AMBULATORY_CARE_PROVIDER_SITE_OTHER): Payer: PRIVATE HEALTH INSURANCE

## 2018-02-13 VITALS — BP 106/72 | HR 74 | Temp 98.1°F | Wt 139.0 lb

## 2018-02-13 DIAGNOSIS — C187 Malignant neoplasm of sigmoid colon: Secondary | ICD-10-CM | POA: Diagnosis not present

## 2018-02-13 DIAGNOSIS — Z23 Encounter for immunization: Secondary | ICD-10-CM

## 2018-02-13 DIAGNOSIS — R1031 Right lower quadrant pain: Secondary | ICD-10-CM | POA: Diagnosis not present

## 2018-02-13 DIAGNOSIS — E559 Vitamin D deficiency, unspecified: Secondary | ICD-10-CM | POA: Diagnosis not present

## 2018-02-13 DIAGNOSIS — R140 Abdominal distension (gaseous): Secondary | ICD-10-CM

## 2018-02-13 DIAGNOSIS — F411 Generalized anxiety disorder: Secondary | ICD-10-CM | POA: Diagnosis not present

## 2018-02-13 DIAGNOSIS — Z0000 Encounter for general adult medical examination without abnormal findings: Secondary | ICD-10-CM

## 2018-02-13 LAB — CBC WITH DIFFERENTIAL/PLATELET
BASOS PCT: 0.3 % (ref 0.0–3.0)
Basophils Absolute: 0 10*3/uL (ref 0.0–0.1)
EOS ABS: 0.1 10*3/uL (ref 0.0–0.7)
EOS PCT: 1.7 % (ref 0.0–5.0)
HEMATOCRIT: 40.2 % (ref 36.0–46.0)
Hemoglobin: 13.7 g/dL (ref 12.0–15.0)
Lymphocytes Relative: 32.4 % (ref 12.0–46.0)
Lymphs Abs: 2 10*3/uL (ref 0.7–4.0)
MCHC: 34 g/dL (ref 30.0–36.0)
MCV: 90.8 fl (ref 78.0–100.0)
MONO ABS: 0.5 10*3/uL (ref 0.1–1.0)
Monocytes Relative: 7.5 % (ref 3.0–12.0)
NEUTROS ABS: 3.5 10*3/uL (ref 1.4–7.7)
Neutrophils Relative %: 58.1 % (ref 43.0–77.0)
PLATELETS: 210 10*3/uL (ref 150.0–400.0)
RBC: 4.43 Mil/uL (ref 3.87–5.11)
RDW: 12.6 % (ref 11.5–15.5)
WBC: 6.1 10*3/uL (ref 4.0–10.5)

## 2018-02-13 LAB — HEPATIC FUNCTION PANEL
ALBUMIN: 4.2 g/dL (ref 3.5–5.2)
ALK PHOS: 63 U/L (ref 39–117)
ALT: 16 U/L (ref 0–35)
AST: 18 U/L (ref 0–37)
BILIRUBIN DIRECT: 0.2 mg/dL (ref 0.0–0.3)
TOTAL PROTEIN: 7.5 g/dL (ref 6.0–8.3)
Total Bilirubin: 0.8 mg/dL (ref 0.2–1.2)

## 2018-02-13 LAB — URINALYSIS, ROUTINE W REFLEX MICROSCOPIC
BILIRUBIN URINE: NEGATIVE
Ketones, ur: 15 — AB
LEUKOCYTES UA: NEGATIVE
NITRITE: NEGATIVE
Specific Gravity, Urine: <=1.005 — AB (ref 1.000–1.030)
TOTAL PROTEIN, URINE-UPE24: NEGATIVE
Urine Glucose: NEGATIVE
Urobilinogen, UA: 0.2 (ref 0.0–1.0)
pH: 6 (ref 5.0–8.0)

## 2018-02-13 LAB — BASIC METABOLIC PANEL
BUN: 8 mg/dL (ref 6–23)
CALCIUM: 9.9 mg/dL (ref 8.4–10.5)
CHLORIDE: 104 meq/L (ref 96–112)
CO2: 29 mEq/L (ref 19–32)
CREATININE: 0.7 mg/dL (ref 0.40–1.20)
GFR: 93.02 mL/min (ref >60.00)
Glucose, Bld: 98 mg/dL (ref 70–99)
Potassium: 4.1 mEq/L (ref 3.5–5.1)
Sodium: 142 mEq/L (ref 135–145)

## 2018-02-13 LAB — TSH: TSH: 1.54 u[IU]/mL (ref 0.35–4.50)

## 2018-02-13 LAB — LIPID PANEL
CHOLESTEROL: 201 mg/dL — AB (ref 0–200)
HDL: 55.2 mg/dL (ref >39.00)
LDL CALC: 132 mg/dL — AB (ref 0–99)
NonHDL: 145.8
TRIGLYCERIDES: 69 mg/dL (ref 0.0–149.0)
Total CHOL/HDL Ratio: 4
VLDL: 13.8 mg/dL (ref 0.0–40.0)

## 2018-02-13 LAB — VITAMIN D 25 HYDROXY (VIT D DEFICIENCY, FRACTURES): VITD: 43.31 ng/mL (ref 30.00–100.00)

## 2018-02-13 MED ORDER — ALPRAZOLAM 0.5 MG PO TABS: 0.2500 mg | ORAL_TABLET | Freq: Two times a day (BID) | ORAL | 1 refills | Status: DC | PRN

## 2018-02-13 NOTE — Patient Instructions (Signed)
Miralax

## 2018-02-13 NOTE — Assessment & Plan Note (Signed)
Xanax prn  Potential benefits of a long term benzodiazepines  use as well as potential risks  and complications were explained to the patient and were aknowledged. 

## 2018-02-13 NOTE — Assessment & Plan Note (Signed)
CEA was normal 4 mo ago Labs CT scan  LOC

## 2018-02-13 NOTE — Assessment & Plan Note (Addendum)
CEA was normal 4 mo ago Labs CT scan suggested LOC

## 2018-02-13 NOTE — Progress Notes (Addendum)
Subjective:  Patient ID: Priscilla Hill, female    DOB: [birth year]  Age: 52 y.o. MRN: [id number]  CC: No chief complaint on file.   HPI Priscilla Hill presents for a well exam C/o discomfort in the RLQ off and on x 6 mo C/o anxiety - episodic  Outpatient Medications Prior to Visit  Medication Sig Dispense Refill  . b complex vitamins tablet Take 1 tablet by mouth daily.    . ciclopirox (PENLAC) 8 % solution Apply topically at bedtime. Apply over nail and surrounding skin. Apply daily over previous coat. After seven (7) days, may remove with alcohol and continue cycle. 6.6 mL 0  . neomycin-polymyxin-hydrocortisone (CORTISPORIN) OTIC solution Place 3 drops into the right ear 4 (four) times daily. 10 mL 3  . omeprazole (PRILOSEC) 40 MG capsule Take 1 capsule (40 mg total) by mouth daily. 30 capsule 11  . VITAMIN D, CHOLECALCIFEROL, PO Take 1 tablet by mouth daily.     No facility-administered medications prior to visit.     ROS Review of Systems  Objective:  BP 106/72   Pulse 74   Temp 98.1 F (36.7 C)   Wt 139 lb (63 kg)   SpO2 98%   BMI 22.44 kg/m   BP Readings from Last 3 Encounters:  02/13/18 106/72  01/20/18 106/68  05/20/17 100/66    Wt Readings from Last 3 Encounters:  02/13/18 139 lb (63 kg)  01/20/18 141 lb (64 kg)  05/20/17 140 lb (63.5 kg)    Physical Exam  Lab Results  Component Value Date   WBC 5.4 02/11/2017   HGB 13.0 02/11/2017   HCT 38.6 02/11/2017   PLT 201.0 02/11/2017   GLUCOSE 88 02/11/2017   CHOL 196 02/11/2017   TRIG 103.0 02/11/2017   HDL 52.10 02/11/2017   LDLCALC 123 (H) 02/11/2017   ALT 14 02/11/2017   AST 20 02/11/2017   NA 143 02/11/2017   K 3.9 02/11/2017   CL 107 02/11/2017   CREATININE 0.67 02/11/2017   BUN 9 02/11/2017   CO2 27 02/11/2017   TSH 1.22 02/11/2017   INR 0.94 05/23/2013    Ct Chest W Contrast  Result Date: 03/24/2016 CLINICAL DATA:  Colon cancer diagnosed in 2014. Resection. Chemotherapy complete.  Sigmoid primary. EXAM: CT CHEST, ABDOMEN, AND PELVIS WITH CONTRAST TECHNIQUE: Multidetector CT imaging of the chest, abdomen and pelvis was performed following the standard protocol during bolus administration of intravenous contrast. CONTRAST:  159mL ISOVUE-300 IOPAMIDOL (ISOVUE-300) INJECTION 61% COMPARISON:  04/07/2015 FINDINGS: CT CHEST FINDINGS Mediastinum/Lymph Nodes: Normal heart size, without pericardial effusion. No central pulmonary embolism, on this non-dedicated study. No mediastinal or hilar adenopathy. Lungs/Pleura: No pleural fluid. Similar minimal subpleural right upper lobe nodularity on image 51/ series 4. Posterior right upper lobe subpleural nodularity on image 38/ series 4 felt to be similar, given differences in slice thickness. Musculoskeletal: No acute osseous abnormality. CT ABDOMEN PELVIS FINDINGS Hepatobiliary: Normal liver. Normal gallbladder, without biliary ductal dilatation. Pancreas: Normal, without mass or ductal dilatation. Spleen: Normal in size, without focal abnormality. Adrenals/Urinary Tract: Normal adrenal glands. Normal kidneys, without hydronephrosis. Normal urinary bladder. Stomach/Bowel: Normal stomach, without wall thickening. Colonic stool burden suggests constipation. No residual or recurrent disease within the sigmoid. Normal terminal ileum and appendix. Normal small bowel. Vascular/Lymphatic: Normal caliber of the aorta and branch vessels. No abdominopelvic adenopathy. Reproductive: Small uterine fundal fibroid of approximately 1.4 cm suspected on sagittal image 94. No adnexal mass. Other: No significant free fluid. No  evidence of omental or peritoneal disease. Musculoskeletal: Minimal disc bulge at L5-S1. IMPRESSION: 1. No acute process or evidence of metastatic disease within the chest, abdomen, or pelvis. 2.  Possible constipation. 3. Small uterine fundal fibroid. Electronically Signed   By: Abigail Miyamoto M.D.   On: 03/24/2016 10:51   Ct Abdomen Pelvis W  Contrast  Result Date: 03/24/2016 CLINICAL DATA:  Colon cancer diagnosed in 2014. Resection. Chemotherapy complete. Sigmoid primary. EXAM: CT CHEST, ABDOMEN, AND PELVIS WITH CONTRAST TECHNIQUE: Multidetector CT imaging of the chest, abdomen and pelvis was performed following the standard protocol during bolus administration of intravenous contrast. CONTRAST:  157mL ISOVUE-300 IOPAMIDOL (ISOVUE-300) INJECTION 61% COMPARISON:  04/07/2015 FINDINGS: CT CHEST FINDINGS Mediastinum/Lymph Nodes: Normal heart size, without pericardial effusion. No central pulmonary embolism, on this non-dedicated study. No mediastinal or hilar adenopathy. Lungs/Pleura: No pleural fluid. Similar minimal subpleural right upper lobe nodularity on image 51/ series 4. Posterior right upper lobe subpleural nodularity on image 38/ series 4 felt to be similar, given differences in slice thickness. Musculoskeletal: No acute osseous abnormality. CT ABDOMEN PELVIS FINDINGS Hepatobiliary: Normal liver. Normal gallbladder, without biliary ductal dilatation. Pancreas: Normal, without mass or ductal dilatation. Spleen: Normal in size, without focal abnormality. Adrenals/Urinary Tract: Normal adrenal glands. Normal kidneys, without hydronephrosis. Normal urinary bladder. Stomach/Bowel: Normal stomach, without wall thickening. Colonic stool burden suggests constipation. No residual or recurrent disease within the sigmoid. Normal terminal ileum and appendix. Normal small bowel. Vascular/Lymphatic: Normal caliber of the aorta and branch vessels. No abdominopelvic adenopathy. Reproductive: Small uterine fundal fibroid of approximately 1.4 cm suspected on sagittal image 94. No adnexal mass. Other: No significant free fluid. No evidence of omental or peritoneal disease. Musculoskeletal: Minimal disc bulge at L5-S1. IMPRESSION: 1. No acute process or evidence of metastatic disease within the chest, abdomen, or pelvis. 2.  Possible constipation. 3. Small uterine  fundal fibroid. Electronically Signed   By: Abigail Miyamoto M.D.   On: 03/24/2016 10:51    Assessment & Plan:   There are no diagnoses linked to this encounter. I am having Jamilah V. Hagner maintain her b complex vitamins, (VITAMIN D, CHOLECALCIFEROL, PO), omeprazole, ciclopirox, and neomycin-polymyxin-hydrocortisone.  No orders of the defined types were placed in this encounter.    Follow-up: No follow-ups on file.  Walker Kehr, MD

## 2018-02-13 NOTE — Addendum Note (Signed)
Addended by: Karren Cobble on: 02/13/2018 04:05 PM   Modules accepted: Orders

## 2018-02-13 NOTE — Assessment & Plan Note (Signed)
CT abd

## 2018-02-21 ENCOUNTER — Ambulatory Visit (INDEPENDENT_AMBULATORY_CARE_PROVIDER_SITE_OTHER)
Admission: RE | Admit: 2018-02-21 | Discharge: 2018-02-21 | Disposition: A | Discharge status: Home / Self Care | Payer: PRIVATE HEALTH INSURANCE | Source: Ambulatory Visit | Attending: Internal Medicine | Admitting: Internal Medicine

## 2018-02-21 DIAGNOSIS — C187 Malignant neoplasm of sigmoid colon: Secondary | ICD-10-CM | POA: Diagnosis not present

## 2018-02-21 DIAGNOSIS — R1031 Right lower quadrant pain: Secondary | ICD-10-CM

## 2018-02-21 DIAGNOSIS — R140 Abdominal distension (gaseous): Secondary | ICD-10-CM

## 2018-02-21 MED ORDER — IOPAMIDOL (ISOVUE-300) INJECTION 61%
100.0000 mL | Freq: Once | INTRAVENOUS | Status: AC | PRN
  Administered 2018-02-21: 100 mL via INTRAVENOUS

## 2018-03-20 ENCOUNTER — Encounter: Payer: Self-pay | Admitting: Internal Medicine

## 2018-03-22 ENCOUNTER — Telehealth: Payer: Self-pay | Admitting: Oncology

## 2018-03-22 NOTE — Telephone Encounter (Signed)
Tried to reach regarding voicemail °

## 2018-03-31 ENCOUNTER — Other Ambulatory Visit: Payer: Self-pay | Admitting: Nurse Practitioner

## 2018-03-31 DIAGNOSIS — C187 Malignant neoplasm of sigmoid colon: Secondary | ICD-10-CM

## 2018-04-03 ENCOUNTER — Inpatient Hospital Stay (HOSPITAL_BASED_OUTPATIENT_CLINIC_OR_DEPARTMENT_OTHER): Payer: PRIVATE HEALTH INSURANCE | Admitting: Nurse Practitioner

## 2018-04-03 ENCOUNTER — Inpatient Hospital Stay: Payer: PRIVATE HEALTH INSURANCE | Attending: Nurse Practitioner

## 2018-04-03 ENCOUNTER — Ambulatory Visit: Payer: PRIVATE HEALTH INSURANCE | Admitting: Oncology

## 2018-04-03 ENCOUNTER — Other Ambulatory Visit: Payer: PRIVATE HEALTH INSURANCE

## 2018-04-03 ENCOUNTER — Encounter: Payer: Self-pay | Admitting: Nurse Practitioner

## 2018-04-03 VITALS — BP 107/77 | HR 69 | Temp 98.2°F | Resp 13 | Ht 66.0 in | Wt 136.8 lb

## 2018-04-03 DIAGNOSIS — Z85038 Personal history of other malignant neoplasm of large intestine: Secondary | ICD-10-CM

## 2018-04-03 DIAGNOSIS — C187 Malignant neoplasm of sigmoid colon: Secondary | ICD-10-CM

## 2018-04-03 LAB — CEA (IN HOUSE-CHCC)

## 2018-04-03 NOTE — Progress Notes (Addendum)
[facility] OFFICE PROGRESS NOTE   Diagnosis: Colon cancer  INTERVAL HISTORY:   Priscilla Hill returns for follow-up.  She feels well.  She continues to have occasional pain at the right abdomen.  No change in bowel habits.  She is constipated intermittently.  When this occurs at times she will see a small amount of blood with a bowel movement.  She has a good appetite and good energy level.  Objective:  Vital signs in last 24 hours:  Blood pressure 107/77, pulse 69, temperature 98.2 F (36.8 C), temperature source Oral, resp. rate 13, height 5' 6"  (1.676 m), weight 136 lb 12.8 oz (62.1 kg), SpO2 98 %.    HEENT: Neck without mass. Lymphatics: No palpable cervical, supraclavicular, right axillary or inguinal lymph nodes.  Soft mobile left axillary lymph node. Resp: Lungs clear bilaterally. Cardio: Regular rate and rhythm. GI: Abdomen soft and nontender.  No hepatomegaly.  No mass. Vascular: No leg edema.  Calves soft and nontender.   Lab Results:  Lab Results  Component Value Date   WBC 6.1 02/13/2018   HGB 13.7 02/13/2018   HCT 40.2 02/13/2018   MCV 90.8 02/13/2018   PLT 210.0 02/13/2018   NEUTROABS 3.5 02/13/2018    Imaging:  No results found.  Medications: I have reviewed the patient's current medications.  Assessment/Plan: 1. 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 The tumor returned microsatellite stable with no loss of mismatch repair protein expression.  Cycle 1 adjuvant FOLFOX chemotherapy 05/28/2013.   Oxaliplatin was held beginning with cycle 8 due to neuropathy symptoms.   Final cycle of 5-fluorouracil/leucovorin given on 10/29/2013.  03/04/2014 CEA less than 0.5.  04/02/2014 CT chest/abdomen/pelvis negative for metastatic disease.  Colonoscopy 04/17/2014-negative  04/07/2015 CT chest/abdomen/pelvis negative for metastatic disease  CTs of the chest,  abdomen, and pelvis 03/24/2016-negative for recurrent colon cancer  Surveillance colonoscopy 05/20/2017- 8 mm polyp in the ascending colon, 5 mm polyp in the transverse colon (Surgical [P], ascending, transverse, polyps (2) SESSILE SERRATED POLYP (FIVE FRAGMENTS). NO HIGH GRADE DYSPLASIA OR MALIGNANCY.  CT abdomen/pelvis 02/21/2018 to evaluate intermittent right lower quadrant pain- no evidence of recurrent or metastatic disease. 2. History of microcytic anemia.  3. Port-A-Cath placement 05/24/2013. 4. History of uterine fibroids. 5. Nausea following cycle 1 FOLFOX. Aloxi and prophylactic Decadron were added with cycle 2. She had nausea following cycle 2. Emend was added with cycle 3. Improved. No nausea following cycle 5 or 6. 6. Mild neutropenia secondary chemotherapy. She received Neulasta beginning with cycle 4 FOLFOX  7. Abdominal discomfort following cycle 4 FOLFOX. Question gastritis related to steroids. She began Prilosec 20 mg daily. 8. History of mild thrombocytopenia secondary chemotherapy. 9. Pruritus and erythema over the palms beginning day 4 following cycle 5 FOLFOX and lasting approximately 3 days. Question hand-foot syndrome related to 5-fluorouracil, question allergic reaction. She was instructed to contact the office if she experiences this again. 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 Persistent numbness in the feet -improved 11. Soft mobile left axillary lymph node on exam 10/07/2014. Stable on exam 04/09/2015. Not noted on exam 10/06/2015 or 03/29/2016.  Present on exam 04/03/2018.     Disposition: Priscilla Hill remains in clinical remission from colon cancer.  We will follow-up on the CEA from today.  She is now approximately 5 years out from the initial colon cancer diagnosis.  She would like to [facility] at the [facility].  She  will return for a follow-up visit and CEA in 1 year.  She will contact the office in the interim with any  problems.  Patient seen with Dr. Benay Spice.    Ned Card ANP/GNP-BC   04/03/2018  10:41 AM  Priscilla Hill remains in clinical remission from rectal cancer.  She will return for an office visit and CEA in 1 year.  Julieanne Manson, MD

## 2018-04-07 ENCOUNTER — Other Ambulatory Visit: Payer: PRIVATE HEALTH INSURANCE

## 2018-04-07 ENCOUNTER — Ambulatory Visit: Payer: PRIVATE HEALTH INSURANCE | Admitting: Oncology

## 2018-06-23 DIAGNOSIS — H6121 Impacted cerumen, right ear: Secondary | ICD-10-CM | POA: Diagnosis not present

## 2018-06-25 ENCOUNTER — Other Ambulatory Visit: Payer: Self-pay | Admitting: Internal Medicine

## 2018-06-26 MED ORDER — CICLOPIROX 8 % EX SOLN: Freq: Every day | CUTANEOUS | 0 refills | Status: DC

## 2018-08-07 DIAGNOSIS — M545 Low back pain: Secondary | ICD-10-CM | POA: Diagnosis not present

## 2018-08-07 DIAGNOSIS — Z6823 Body mass index (BMI) 23.0-23.9, adult: Secondary | ICD-10-CM | POA: Diagnosis not present

## 2018-08-07 DIAGNOSIS — Z85038 Personal history of other malignant neoplasm of large intestine: Secondary | ICD-10-CM | POA: Insufficient documentation

## 2018-08-17 ENCOUNTER — Ambulatory Visit: Payer: PRIVATE HEALTH INSURANCE | Admitting: Nurse Practitioner

## 2018-08-17 VITALS — BP 120/78 | HR 72 | Temp 98.3°F | Resp 16 | Ht 66.0 in | Wt 138.0 lb

## 2018-08-17 DIAGNOSIS — H6121 Impacted cerumen, right ear: Secondary | ICD-10-CM

## 2018-08-17 NOTE — Progress Notes (Signed)
   Subjective:    Patient ID: Priscilla Hill, female    DOB: October 21, 1966, 52 y.o.   MRN: [id number]  HPI    Review of Systems     Objective:   Physical Exam        Assessment & Plan:

## 2018-08-17 NOTE — Patient Instructions (Signed)
Continue over the counter sweet oil as directed by ENT as needed Return to clinic as needed for ear wax removal

## 2018-08-17 NOTE — Progress Notes (Signed)
   Subjective:    Patient ID: Priscilla Hill, female    DOB: [birth year], 52 y.o.   MRN: [id number]  HPI  Priscilla Hill is here today with c/o right ear pressure that is ongoing. Has seen ENT in the past for ear wax buildup and feels she has the same problem. She admits she has been able to remove some with her finger. She reports used OTC ear wax supplies in the past but did not work. She has recently applied sweet oil prior to visit today without much relief of symptoms. She admits some itching. Denies ear pain, fever or upper respiratory symptoms.  Review of Systems  Constitutional: Negative for fatigue and fever.  HENT: Negative for congestion, ear discharge, ear pain, facial swelling, rhinorrhea, sinus pressure, sinus pain, sneezing and sore throat.        Right ear pressure with itching  Skin: Negative for color change and rash.  Neurological: Negative for headaches.       Objective:   Physical Exam  Constitutional: She is oriented to person, place, and time. She appears well-developed and well-nourished.  HENT:  Head: Normocephalic.  Left Ear: External ear normal.  Right EAC with hard brown cerumen and clear after ear lavage/currette. No erythema or discharge to either ear with clear TM.  Tolerated procedure; signed procedure consent   Neck: Normal range of motion.  Cardiovascular: Normal rate.  Pulmonary/Chest: Effort normal. No respiratory distress.  Neurological: She is alert and oriented to person, place, and time.  Skin: Skin is warm and dry.  Psychiatric: She has a normal mood and affect.  Vitals reviewed.         Assessment & Plan:

## 2019-02-21 ENCOUNTER — Encounter: Payer: Federal, State, Local not specified - PPO | Admitting: Internal Medicine

## 2019-04-03 ENCOUNTER — Other Ambulatory Visit: Payer: Self-pay

## 2019-04-03 ENCOUNTER — Telehealth: Payer: Self-pay | Admitting: Oncology

## 2019-04-03 ENCOUNTER — Inpatient Hospital Stay: Payer: Federal, State, Local not specified - PPO | Attending: Oncology

## 2019-04-03 ENCOUNTER — Inpatient Hospital Stay (HOSPITAL_BASED_OUTPATIENT_CLINIC_OR_DEPARTMENT_OTHER): Payer: Federal, State, Local not specified - PPO | Admitting: Oncology

## 2019-04-03 VITALS — BP 105/74 | HR 71 | Temp 98.1°F | Resp 18 | Ht 66.0 in | Wt 136.5 lb

## 2019-04-03 DIAGNOSIS — C187 Malignant neoplasm of sigmoid colon: Secondary | ICD-10-CM

## 2019-04-03 DIAGNOSIS — Z85038 Personal history of other malignant neoplasm of large intestine: Secondary | ICD-10-CM

## 2019-04-03 LAB — CEA (IN HOUSE-CHCC): CEA (CHCC-In House): <1 ng/mL (ref 0.00–5.00)

## 2019-04-03 NOTE — Telephone Encounter (Signed)
Scheduled appt per 5/12 los. ° °A calendar will be mailed out. °

## 2019-04-03 NOTE — Progress Notes (Signed)
[facility] OFFICE PROGRESS NOTE   Diagnosis: Colon cancer  INTERVAL HISTORY:   Priscilla Hill returns as scheduled.  She feels well.  No difficulty with bowel function.  Good appetite and energy level.  She is working.  She continues to have numbness and discomfort at the feet related to oxaliplatin neuropathy.  This has not changed.  No hand numbness.  No other complaint.  Objective:  Vital signs in last 24 hours:  Blood pressure 105/74, pulse 71, temperature 98.1 F (36.7 C), temperature source Oral, resp. rate 18, height _0  (1.676 m), weight 136 lb 8 oz (61.9 kg), SpO2 100 %.    HEENT: Neck without mass Lymphatics: No cervical, supraclavicular, axillary, or inguinal nodes Resp: Slight decrease in breath sounds at the right compared to left chest with end inspiratory bronchial sounds at the upper posterior chest bilaterally, no dullness to percussion, no respiratory distress Cardio: Regular rate and rhythm GI: No hepatosplenomegaly, no mass, nontender Vascular: No leg edema   Lab Results:  Lab Results  Component Value Date   WBC 6.1 02/13/2018   HGB 13.7 02/13/2018   HCT 40.2 02/13/2018   MCV 90.8 02/13/2018   PLT 210.0 02/13/2018   NEUTROABS 3.5 02/13/2018    CMP  Lab Results  Component Value Date   NA 142 02/13/2018   K 4.1 02/13/2018   CL 104 02/13/2018   CO2 29 02/13/2018   GLUCOSE 98 02/13/2018   BUN 8 02/13/2018   CREATININE 0.70 02/13/2018   CALCIUM 9.9 02/13/2018   PROT 7.5 02/13/2018   ALBUMIN 4.2 02/13/2018   AST 18 02/13/2018   ALT 16 02/13/2018   ALKPHOS 63 02/13/2018   BILITOT 0.8 02/13/2018   GFRNONAA >90 05/23/2013   GFRAA >90 05/23/2013    Lab Results  Component Value Date   CEA1 <1.00 04/03/2018     Medications: I have reviewed the patient's current medications.   Assessment/Plan: 1. 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 The tumor  returned microsatellite stable with no loss of mismatch repair protein expression.  Cycle 1 adjuvant FOLFOX chemotherapy 05/28/2013.   Oxaliplatin was held beginning with cycle 8 due to neuropathy symptoms.   Final cycle of 5-fluorouracil/leucovorin given on 10/29/2013.  03/04/2014 CEA less than 0.5.  04/02/2014 CT chest/abdomen/pelvis negative for metastatic disease.  Colonoscopy 04/17/2014-negative  04/07/2015 CT chest/abdomen/pelvis negative for metastatic disease  CTs of the chest, abdomen, and pelvis 03/24/2016-negative for recurrent colon cancer  Surveillance colonoscopy 05/20/2017- 8 mm polyp in the ascending colon, 5 mm polyp in the transverse colon (Surgical [P], ascending, transverse, polyps (2) SESSILE SERRATED POLYP (FIVE FRAGMENTS). NO HIGH GRADE DYSPLASIA OR MALIGNANCY.  CT abdomen/pelvis 02/21/2018 to evaluate intermittent right lower quadrant pain- no evidence of recurrent or metastatic disease. 2. History of microcytic anemia.  3. Port-A-Cath placement 05/24/2013. 4. History of uterine fibroids. 5. Nausea following cycle 1 FOLFOX. Aloxi and prophylactic Decadron were added with cycle 2. She had nausea following cycle 2. Emend was added with cycle 3. Improved. No nausea following cycle 5 or 6. 6. Mild neutropenia secondary chemotherapy. She received Neulasta beginning with cycle 4 FOLFOX  7. Abdominal discomfort following cycle 4 FOLFOX. Question gastritis related to steroids. She began Prilosec 20 mg daily. 8. History of mild thrombocytopenia secondary chemotherapy. 9. Pruritus and erythema over the palms beginning day 4 following cycle 5 FOLFOX and lasting approximately 3 days. Question hand-foot syndrome related to 5-fluorouracil, question allergic  reaction. She was instructed to contact the office if she experiences this again. 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 Soft mobile left axillary lymph node  on exam 10/07/2014. Stable on exam 04/09/2015. Not noted on exam 10/06/2015 or 03/29/2016.  Present on exam 04/03/2018.     Disposition: Priscilla Hill is in clinical remission from colon cancer.  We will follow-up on the CEA from today.  She would like to continue follow-up at the Cancer center.  She will return for an office visit and CEA in 1 year. She will continue surveillance colonoscopies with Dr. Havery Moros.  Priscilla Coder, MD  04/03/2019  11:45 AM

## 2019-05-04 ENCOUNTER — Other Ambulatory Visit: Payer: Self-pay

## 2019-05-04 ENCOUNTER — Ambulatory Visit (INDEPENDENT_AMBULATORY_CARE_PROVIDER_SITE_OTHER): Payer: Federal, State, Local not specified - PPO | Admitting: Internal Medicine

## 2019-05-04 ENCOUNTER — Encounter: Payer: Self-pay | Admitting: Internal Medicine

## 2019-05-04 ENCOUNTER — Other Ambulatory Visit (INDEPENDENT_AMBULATORY_CARE_PROVIDER_SITE_OTHER): Payer: Federal, State, Local not specified - PPO

## 2019-05-04 VITALS — BP 114/72 | HR 73 | Temp 98.7°F | Ht 66.0 in | Wt 134.0 lb

## 2019-05-04 DIAGNOSIS — C187 Malignant neoplasm of sigmoid colon: Secondary | ICD-10-CM

## 2019-05-04 DIAGNOSIS — Z0000 Encounter for general adult medical examination without abnormal findings: Secondary | ICD-10-CM | POA: Diagnosis not present

## 2019-05-04 DIAGNOSIS — R232 Flushing: Secondary | ICD-10-CM | POA: Diagnosis not present

## 2019-05-04 DIAGNOSIS — D6861 Antiphospholipid syndrome: Secondary | ICD-10-CM

## 2019-05-04 DIAGNOSIS — F419 Anxiety disorder, unspecified: Secondary | ICD-10-CM

## 2019-05-04 DIAGNOSIS — E559 Vitamin D deficiency, unspecified: Secondary | ICD-10-CM | POA: Diagnosis not present

## 2019-05-04 LAB — BASIC METABOLIC PANEL
BUN: 12 mg/dL (ref 6–23)
CO2: 28 mEq/L (ref 19–32)
Calcium: 9.5 mg/dL (ref 8.4–10.5)
Chloride: 106 mEq/L (ref 96–112)
Creatinine, Ser: 0.69 mg/dL (ref 0.40–1.20)
GFR: 88.58 mL/min (ref >60.00)
Glucose, Bld: 96 mg/dL (ref 70–99)
Potassium: 3.9 mEq/L (ref 3.5–5.1)
Sodium: 142 mEq/L (ref 135–145)

## 2019-05-04 LAB — URINALYSIS, ROUTINE W REFLEX MICROSCOPIC
Bilirubin Urine: NEGATIVE
Ketones, ur: NEGATIVE
Leukocytes,Ua: NEGATIVE
Nitrite: NEGATIVE
Specific Gravity, Urine: 1.01 (ref 1.000–1.030)
Total Protein, Urine: NEGATIVE
Urine Glucose: NEGATIVE
Urobilinogen, UA: 0.2 (ref 0.0–1.0)
WBC, UA: NONE SEEN (ref 0–?)
pH: 6 (ref 5.0–8.0)

## 2019-05-04 LAB — CBC WITH DIFFERENTIAL/PLATELET
Basophils Absolute: 0.1 10*3/uL (ref 0.0–0.1)
Basophils Relative: 1.1 % (ref 0.0–3.0)
Eosinophils Absolute: 0.1 10*3/uL (ref 0.0–0.7)
Eosinophils Relative: 2 % (ref 0.0–5.0)
HCT: 39.6 % (ref 36.0–46.0)
Hemoglobin: 13.4 g/dL (ref 12.0–15.0)
Lymphocytes Relative: 29.9 % (ref 12.0–46.0)
Lymphs Abs: 1.5 10*3/uL (ref 0.7–4.0)
MCHC: 33.7 g/dL (ref 30.0–36.0)
MCV: 92.2 fl (ref 78.0–100.0)
Monocytes Absolute: 0.4 10*3/uL (ref 0.1–1.0)
Monocytes Relative: 7.6 % (ref 3.0–12.0)
Neutro Abs: 3 10*3/uL (ref 1.4–7.7)
Neutrophils Relative %: 59.4 % (ref 43.0–77.0)
Platelets: 222 10*3/uL (ref 150.0–400.0)
RBC: 4.3 Mil/uL (ref 3.87–5.11)
RDW: 12.8 % (ref 11.5–15.5)
WBC: 5.1 10*3/uL (ref 4.0–10.5)

## 2019-05-04 LAB — LIPID PANEL
Cholesterol: 225 mg/dL — ABNORMAL HIGH (ref 0–200)
HDL: 65.9 mg/dL (ref >39.00)
LDL Cholesterol: 149 mg/dL — ABNORMAL HIGH (ref 0–99)
NonHDL: 159.21
Total CHOL/HDL Ratio: 3
Triglycerides: 49 mg/dL (ref 0.0–149.0)
VLDL: 9.8 mg/dL (ref 0.0–40.0)

## 2019-05-04 LAB — HEPATIC FUNCTION PANEL
ALT: 13 U/L (ref 0–35)
AST: 15 U/L (ref 0–37)
Albumin: 4.5 g/dL (ref 3.5–5.2)
Alkaline Phosphatase: 63 U/L (ref 39–117)
Bilirubin, Direct: 0.1 mg/dL (ref 0.0–0.3)
Total Bilirubin: 0.8 mg/dL (ref 0.2–1.2)
Total Protein: 7.2 g/dL (ref 6.0–8.3)

## 2019-05-04 LAB — TSH: TSH: 1.37 u[IU]/mL (ref 0.35–4.50)

## 2019-05-04 MED ORDER — B COMPLEX PO TABS: 1.0000 | ORAL_TABLET | Freq: Every day | ORAL | 3 refills | Status: DC

## 2019-05-04 NOTE — Assessment & Plan Note (Signed)
We discussed age appropriate health related issues, including available/recomended screening tests and vaccinations. We discussed a need for adhering to healthy diet and exercise. Labs/EKG were reviewed/ordered. All questions were answered.  Declined all shots

## 2019-05-04 NOTE — Assessment & Plan Note (Signed)
Resolving

## 2019-05-04 NOTE — Assessment & Plan Note (Signed)
ASA for travel

## 2019-05-04 NOTE — Assessment & Plan Note (Signed)
F/u w/Dr Cherrill w/blood test

## 2019-05-04 NOTE — Assessment & Plan Note (Signed)
Xanax prn 

## 2019-05-04 NOTE — Assessment & Plan Note (Signed)
Vit D 

## 2019-05-04 NOTE — Progress Notes (Signed)
Subjective:  Patient ID: Priscilla Hill, female    DOB: 1966/06/02  Age: 53 y.o. MRN: [id number]  CC: No chief complaint on file.   HPI Priscilla Hill presents for a well exam  Outpatient Medications Prior to Visit  Medication Sig Dispense Refill  . Cholecalciferol (VITAMIN D3) 100000 UNIT/GM POWD Take 1 tablet by mouth daily.    Marland Kitchen omeprazole (PRILOSEC) 40 MG capsule Take 1 capsule (40 mg total) by mouth daily. (Patient taking differently: Take 40 mg by mouth daily as needed. ) 30 capsule 11   No facility-administered medications prior to visit.     ROS: Review of Systems  Constitutional: Negative for activity change, appetite change, chills, fatigue and unexpected weight change.  HENT: Negative for congestion, mouth sores and sinus pressure.   Eyes: Negative for visual disturbance.  Respiratory: Negative for cough and chest tightness.   Gastrointestinal: Negative for abdominal pain and nausea.  Genitourinary: Negative for difficulty urinating, frequency and vaginal pain.  Musculoskeletal: Negative for back pain and gait problem.  Skin: Negative for pallor and rash.  Neurological: Negative for dizziness, tremors, weakness, numbness and headaches.  Psychiatric/Behavioral: Negative for confusion, sleep disturbance and suicidal ideas.    Objective:  BP 114/72 (BP Location: Left Arm, Patient Position: Sitting, Cuff Size: Normal)   Pulse 73   Temp 98.7 F (37.1 C) (Oral)   Ht 5\' 6"  (1.676 m)   Wt 134 lb (60.8 kg)   SpO2 96%   BMI 21.63 kg/m   BP Readings from Last 3 Encounters:  05/04/19 114/72  04/03/19 105/74  08/17/18 120/78    Wt Readings from Last 3 Encounters:  05/04/19 134 lb (60.8 kg)  04/03/19 136 lb 8 oz (61.9 kg)  08/17/18 138 lb (62.6 kg)    Physical Exam Constitutional:      General: She is not in acute distress.    Appearance: She is well-developed.  HENT:     Head: Normocephalic.     Right Ear: External ear normal.     Left Ear: External ear  normal.     Nose: Nose normal.  Eyes:     General:        Right eye: No discharge.        Left eye: No discharge.     Conjunctiva/sclera: Conjunctivae normal.     Pupils: Pupils are equal, round, and reactive to light.  Neck:     Musculoskeletal: Normal range of motion and neck supple.     Thyroid: No thyromegaly.     Vascular: No JVD.     Trachea: No tracheal deviation.  Cardiovascular:     Rate and Rhythm: Normal rate and regular rhythm.     Heart sounds: Normal heart sounds.  Pulmonary:     Effort: No respiratory distress.     Breath sounds: No stridor. No wheezing.  Abdominal:     General: Bowel sounds are normal. There is no distension.     Palpations: Abdomen is soft. There is no mass.     Tenderness: There is no abdominal tenderness. There is no guarding or rebound.  Musculoskeletal:        General: No tenderness.  Lymphadenopathy:     Cervical: No cervical adenopathy.  Skin:    Findings: No erythema or rash.  Neurological:     Cranial Nerves: No cranial nerve deficit.     Motor: No abnormal muscle tone.     Coordination: Coordination normal.     Deep Tendon  Reflexes: Reflexes normal.  Psychiatric:        Behavior: Behavior normal.        Thought Content: Thought content normal.        Judgment: Judgment normal.     Lab Results  Component Value Date   WBC 6.1 02/13/2018   HGB 13.7 02/13/2018   HCT 40.2 02/13/2018   PLT 210.0 02/13/2018   GLUCOSE 98 02/13/2018   CHOL 201 (H) 02/13/2018   TRIG 69.0 02/13/2018   HDL 55.20 02/13/2018   LDLCALC 132 (H) 02/13/2018   ALT 16 02/13/2018   AST 18 02/13/2018   NA 142 02/13/2018   K 4.1 02/13/2018   CL 104 02/13/2018   CREATININE 0.70 02/13/2018   BUN 8 02/13/2018   CO2 29 02/13/2018   TSH 1.54 02/13/2018   INR 0.94 05/23/2013    Ct Abdomen Pelvis W Contrast  Result Date: 02/22/2018 CLINICAL DATA:  Intermittent right lower quadrant pain for 1 year. History of remote sigmoid colon cancer 2014 with partial  colectomy. EXAM: CT ABDOMEN AND PELVIS WITH CONTRAST TECHNIQUE: Multidetector CT imaging of the abdomen and pelvis was performed using the standard protocol following bolus administration of intravenous contrast. CONTRAST:  169mL ISOVUE-300 IOPAMIDOL (ISOVUE-300) INJECTION 61% COMPARISON:  03/24/2016 FINDINGS: Lower chest: Minor basilar atelectasis. Normal heart size. No pericardial or pleural effusion. Hepatobiliary: No focal liver abnormality is seen. No gallstones, gallbladder wall thickening, or biliary dilatation. Pancreas: Unremarkable. No pancreatic ductal dilatation or surrounding inflammatory changes. Spleen: Normal in size without focal abnormality. Adrenals/Urinary Tract: Normal adrenal glands. Kidneys demonstrate no acute obstructive uropathy, hydronephrosis, focal abnormality. Ureters are symmetric and decompressed. Air noted within the bladder dome anteriorly, nonspecific, query recent instrumentation or catheterization. No bladder wall thickening. Stomach/Bowel: Negative for bowel obstruction, significant dilatation, ileus, or free air. Normal appendix demonstrated. Previous partial colectomy with an anastomosis in the midline pelvis along the sigmoid colon. No significant bowel wall thickening or focal abnormality by CT. Vascular/Lymphatic: No significant vascular findings are present. No enlarged abdominal or pelvic lymph nodes. Reproductive: Tubal occlusion radiopaque devices noted bilaterally. Uterus and adnexa normal in size. Slight nodular enhancement of the uterus, suspect small fibroids. No pelvic free fluid or fluid collection. Other: No abdominal wall hernia or abnormality. No abdominopelvic ascites. Musculoskeletal: No acute or significant osseous findings. IMPRESSION: No acute abdominal or pelvic finding by CT. Stable postop changes from partial colectomy without evidence recurrence or metastatic disease. Electronically Signed   By: Jerilynn Mages.  Shick M.D.   On: 02/22/2018 08:24    Assessment &  Plan:   There are no diagnoses linked to this encounter.   No orders of the defined types were placed in this encounter.    Follow-up: No follow-ups on file.  Walker Kehr, MD

## 2019-05-05 DIAGNOSIS — M79671 Pain in right foot: Secondary | ICD-10-CM | POA: Diagnosis not present

## 2019-05-05 DIAGNOSIS — S93409S Sprain of unspecified ligament of unspecified ankle, sequela: Secondary | ICD-10-CM | POA: Diagnosis not present

## 2019-05-05 DIAGNOSIS — S99921A Unspecified injury of right foot, initial encounter: Secondary | ICD-10-CM | POA: Diagnosis not present

## 2019-06-28 ENCOUNTER — Encounter: Payer: Self-pay | Admitting: Internal Medicine

## 2019-06-28 ENCOUNTER — Other Ambulatory Visit: Payer: Self-pay

## 2019-06-28 ENCOUNTER — Ambulatory Visit (INDEPENDENT_AMBULATORY_CARE_PROVIDER_SITE_OTHER): Payer: Federal, State, Local not specified - PPO | Admitting: Internal Medicine

## 2019-06-28 DIAGNOSIS — C187 Malignant neoplasm of sigmoid colon: Secondary | ICD-10-CM

## 2019-06-28 DIAGNOSIS — M5416 Radiculopathy, lumbar region: Secondary | ICD-10-CM | POA: Diagnosis not present

## 2019-06-28 MED ORDER — VITAMIN D3 50 MCG (2000 UT) PO CAPS: 2000.0000 [IU] | ORAL_CAPSULE | Freq: Every day | ORAL | 3 refills | Status: DC

## 2019-06-28 MED ORDER — TIZANIDINE HCL 4 MG PO TABS: 4.0000 mg | ORAL_TABLET | Freq: Three times a day (TID) | ORAL | 1 refills | Status: DC | PRN

## 2019-06-28 MED ORDER — HYDROCODONE-ACETAMINOPHEN 5-325 MG PO TABS: 0.5000 | ORAL_TABLET | Freq: Four times a day (QID) | ORAL | 0 refills | Status: AC | PRN

## 2019-06-28 MED ORDER — IBUPROFEN 600 MG PO TABS: ORAL_TABLET | ORAL | 1 refills | Status: DC

## 2019-06-28 MED ORDER — KETOROLAC TROMETHAMINE 60 MG/2ML IM SOLN
60.0000 mg | Freq: Once | INTRAMUSCULAR | Status: AC
  Administered 2019-06-28: 08:00:00 60 mg via INTRAMUSCULAR

## 2019-06-28 MED ORDER — METHYLPREDNISOLONE 4 MG PO TBPK: ORAL_TABLET | ORAL | 0 refills | Status: DC

## 2019-06-28 NOTE — Assessment & Plan Note (Addendum)
Toradol IM Medrol pac Zanaflex Norco prn  Potential benefits of a short term opioids use as well as potential risks (i.e. addiction risk, apnea etc) and complications (i.e. Somnolence, constipation and others) were explained to the patient and were aknowledged. X ray

## 2019-06-28 NOTE — Progress Notes (Signed)
Subjective:  Patient ID: Priscilla Hill, female    DOB: Jul 19, 1966  Age: 53 y.o. MRN: [id number]  CC: Back Pain ("muscle spasms" x 3 weeks)   HPI Priscilla Hill presents for LBP x 2-3 wks, much worse since last Fri irrd to R leg down to the knee. Pain is severe. No injury. Stress at work  Outpatient Medications Prior to Visit  Medication Sig Dispense Refill   b complex vitamins tablet Take 1 tablet by mouth daily. 100 tablet 3   Cholecalciferol (VITAMIN D3) 100000 UNIT/GM POWD Take 1 tablet by mouth daily.     omeprazole (PRILOSEC) 40 MG capsule Take 1 capsule (40 mg total) by mouth daily. (Patient taking differently: Take 40 mg by mouth daily as needed. ) 30 capsule 11   No facility-administered medications prior to visit.     ROS: Review of Systems  Constitutional: Negative for activity change, appetite change, chills, fatigue and unexpected weight change.  HENT: Negative for congestion, mouth sores and sinus pressure.   Eyes: Negative for visual disturbance.  Respiratory: Negative for cough and chest tightness.   Gastrointestinal: Negative for abdominal pain and nausea.  Genitourinary: Negative for difficulty urinating, frequency and vaginal pain.  Musculoskeletal: Positive for back pain and gait problem.  Skin: Negative for pallor and rash.  Neurological: Negative for dizziness, tremors, weakness, numbness and headaches.  Psychiatric/Behavioral: Negative for confusion, sleep disturbance and suicidal ideas.    Objective:  BP 118/72 (BP Location: Left Arm, Patient Position: Sitting, Cuff Size: Normal)    Pulse 82    Temp 98.2 F (36.8 C) (Oral)    Ht 5\' 6"  (1.676 m)    Wt 136 lb (61.7 kg)    SpO2 95%    BMI 21.95 kg/m   BP Readings from Last 3 Encounters:  06/28/19 118/72  05/04/19 114/72  04/03/19 105/74    Wt Readings from Last 3 Encounters:  06/28/19 136 lb (61.7 kg)  05/04/19 134 lb (60.8 kg)  04/03/19 136 lb 8 oz (61.9 kg)    Physical Exam Constitutional:        General: She is not in acute distress.    Appearance: She is well-developed.  HENT:     Head: Normocephalic.     Right Ear: External ear normal.     Left Ear: External ear normal.     Nose: Nose normal.  Eyes:     General:        Right eye: No discharge.        Left eye: No discharge.     Conjunctiva/sclera: Conjunctivae normal.     Pupils: Pupils are equal, round, and reactive to light.  Neck:     Musculoskeletal: Normal range of motion and neck supple.     Thyroid: No thyromegaly.     Vascular: No JVD.     Trachea: No tracheal deviation.  Cardiovascular:     Rate and Rhythm: Normal rate and regular rhythm.     Heart sounds: Normal heart sounds.  Pulmonary:     Effort: No respiratory distress.     Breath sounds: No stridor. No wheezing.  Abdominal:     General: Bowel sounds are normal. There is no distension.     Palpations: Abdomen is soft. There is no mass.     Tenderness: There is no abdominal tenderness. There is no guarding or rebound.  Musculoskeletal:        General: No tenderness.  Lymphadenopathy:     Cervical: No  cervical adenopathy.  Skin:    Findings: No erythema or rash.  Neurological:     Cranial Nerves: No cranial nerve deficit.     Motor: No abnormal muscle tone.     Coordination: Coordination normal.     Deep Tendon Reflexes: Reflexes normal.  Psychiatric:        Behavior: Behavior normal.        Thought Content: Thought content normal.        Judgment: Judgment normal.   in distress due to pain LS very tender R str leg elev (+)  Lab Results  Component Value Date   WBC 5.1 05/04/2019   HGB 13.4 05/04/2019   HCT 39.6 05/04/2019   PLT 222.0 05/04/2019   GLUCOSE 96 05/04/2019   CHOL 225 (H) 05/04/2019   TRIG 49.0 05/04/2019   HDL 65.90 05/04/2019   LDLCALC 149 (H) 05/04/2019   ALT 13 05/04/2019   AST 15 05/04/2019   NA 142 05/04/2019   K 3.9 05/04/2019   CL 106 05/04/2019   CREATININE 0.69 05/04/2019   BUN 12 05/04/2019   CO2 28  05/04/2019   TSH 1.37 05/04/2019   INR 0.94 05/23/2013    Ct Abdomen Pelvis W Contrast  Result Date: 02/22/2018 CLINICAL DATA:  Intermittent right lower quadrant pain for 1 year. 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 EXAM: CT ABDOMEN AND PELVIS WITH CONTRAST TECHNIQUE: Multidetector CT imaging of the abdomen and pelvis was performed using the standard protocol following bolus administration of intravenous contrast. CONTRAST:  1108mL ISOVUE-300 IOPAMIDOL (ISOVUE-300) INJECTION 61% COMPARISON:  03/24/2016 FINDINGS: Lower chest: Minor basilar atelectasis. Normal heart size. No pericardial or pleural effusion. Hepatobiliary: No focal liver abnormality is seen. No gallstones, gallbladder wall thickening, or biliary dilatation. Pancreas: Unremarkable. No pancreatic ductal dilatation or surrounding inflammatory changes. Spleen: Normal in size without focal abnormality. Adrenals/Urinary Tract: Normal adrenal glands. Kidneys demonstrate no acute obstructive uropathy, hydronephrosis, focal abnormality. Ureters are symmetric and decompressed. Air noted within the bladder dome anteriorly, nonspecific, query recent instrumentation or catheterization. No bladder wall thickening. Stomach/Bowel: Negative for bowel obstruction, significant dilatation, ileus, or free air. Normal appendix demonstrated. Previous partial colectomy with an anastomosis in the midline pelvis along the sigmoid colon. No significant bowel wall thickening or focal abnormality by CT. Vascular/Lymphatic: No significant vascular findings are present. No enlarged abdominal or pelvic lymph nodes. Reproductive: Tubal occlusion radiopaque devices noted bilaterally. Uterus and adnexa normal in size. Slight nodular enhancement of the uterus, suspect small fibroids. No pelvic free fluid or fluid collection. Other: No abdominal wall hernia or abnormality. No abdominopelvic ascites. Musculoskeletal: No acute or significant osseous  findings. IMPRESSION: No acute abdominal or pelvic finding by CT. Stable postop changes from partial colectomy without evidence recurrence or metastatic disease. Electronically Signed   By: Jerilynn Mages.  Shick M.D.   On: 02/22/2018 08:24    Assessment & Plan:   There are no diagnoses linked to this encounter.   No orders of the defined types were placed in this encounter.    Follow-up: No follow-ups on file.  Walker Kehr, MD

## 2019-06-28 NOTE — Assessment & Plan Note (Signed)
LS X rar

## 2019-06-28 NOTE — Patient Instructions (Signed)
???????? Sciatica  ???????? - ??? ????, ??????????????????, ???????? ??? ??????????? ? ???????? ????? ??????????? ?????. ?????????? ???? ?????????? ? ???????? ? ???????? ?? ?????? ????? ???. ???? ???? ????????? ??????? ? ??????? ?????? ? ?????? ??????. ?? ????? ???????????? ???????????????? (????????) ? ??????? ?????? ??????????? ?????, ?????? ? ??????? ????. ???????? - ??? ??????? ??????? ???????????? ?????????, ??????? ????????????? ?????????? ??? ????????? ???????? ?? ?????????? ????. ???????? ????? ???????? ??????????? ?????? ???? ??????? ????. ???????? ?????? ???????? ?????????????? ??? ????? ???????. ? ????????? ??????? ???????? ????? ???????????? (????????????). ?????? ???????? ???????? ????? ????????? ???????? ???????? ?? ?????????? ???? ??? ??? ??????????. ??? ????? ???? ??????????? ??????????:  ???? ????? ??????? ???????????? ??????? ???????? ?????? (????? ??????????????? ?????).  ?????????? ????????? ? ??????????? ??????.  ??????? ????????????, ????????????? ????? ???????.  ???? ?????????????? ??????? ????? ?????? ??????????? ?????.  ????? (???????) ????? ????.  ????????????.  ???????. ??????????? ?????. ??? ???????? ????? ?????????????? ???????? ? ??? ????? ????????? ????? ????????? ???????:  ??????? ???????, ????????? ? ????????? ??? ?????????? ?? ???????????.  ?????? ???? ? ????????.  ??????? ? ???????? ?????? ????? ??? ?????????????? ????????????? ? ??????? ?????.  ??????????????? ???????.  ?????????? ????????, ????????? ? ?????????????? ???????? ??? ???????? ????????.  ????????. ?????? ???????? ??? ????????? ???????? ????? ??????????? ?? ?????? ?? ????? ??????? ? ??? ????? ????????:  ????? ?? ????????? ????????? ? ??????? ????????, ????, ????? ??? ???????: ? ?????? ???????????, ???????? ??? ????? ????. ? ???????? ??????. ? ?????? ????.  ?????????????????? ?????? ?????? ??? ? ??????? ??????? ????.  ???????? ????.  ??????? ???? ? ????????, ???????  ?????????? ????????. ???????? ????? ????????????, ????? ?? ????????, ??????? ??? ????????, ??? ????? ?? ?????? ??? ?????? ? ??????? ??????? ???????. ??? ??? ?????????????? ??? ????????? ????? ???? ??????????????? ? ??????:  ????? ????????? ? ???????????? ????????.  ???????????? ????????????.  ???????? ?????.  ???????????????? ????????????, ????? ???: ? ???????. ? ???. ? ?? (???????????? ??????????). ??? ??? ?????? ?? ?????? ??????? ??? ????????? ?????????? ?????????????? ??? ???????. ??? ?? ?????, ??????? ????? ????????:  ???????? ??? ????????? ?????????? ??????????.  ?????????? ? ????????.  ????????????? ???? ? ????? ? ?????????????? ???????.  ????? ????????????? ??????????, ??????? ????????: ? ????????? ???? ? ????. ? ??????????? ?????.  ???????? ????????????? ??????????, ??????? ???????? ????????? ????, ??????????? ? ?????????? ?????? ??????????? ????? (?????????? ??????????).  ????????????? ????????. ? ???????? ???????? ???????? ????? ????????????: ????????????? ?????????  ?????????? ?????????????? ? ??????????? ????????????? ????????? ?????? ??? ??????? ????? ??????? ??????.  ???????? ? ?????? ???????? ?????, ????? ?? ?? ????? ?????? ???????????? ??? ?????????: ? ???????? ???????? ?????????? ??? ?????? ? ??????? ????????. ? ????? ?? ?? ??????? ?????. ????????, ??? ??????????? ??????? ??? ???? ??? ???????????? ??? ??????? ??????:  ???? ?????????? ????????, ????? ???? ???? ?????????? ??????-??????? ?????.  ????????? ????????????? ????????, ??????????? ??? ??????? ??? ??????????? ?????????.  ??????????? ???????? ? ??????? ??????????? ?????????, ????????, ???????, ?????????????? ????????, ?????? ?????? ? ?????.  ?????????? ???????????? ????????? ? ??????? ??????????? ????? ? ???????????? ???????, ????????, ??????? ??? ??????? ?????????. ??????????? ????      ???? ??? ?????????, ????????????? ??? ? ?????????? ???????. ? ???????? ??? ? ??????????? ?????. ?  ?????????? ????????? ????? ????? ????? ? ???????. ? ????????????? ????? ?? ????? ?? 20????? 2-3???? ? ????.  ???? ??? ?????????, ????????????? ????? ? ?????????? ???????. ??????????? ???????? ?????, ??????????????? ????? ??????? ??????, ???????? ??????? ???????? ??? ??????. ? ?????????? ????????? ????? ????? ????? ? ?????? ??????????. ? ???????? ?????? ???????? ??? ?????? ?? 20-30?????. ? ???? ???? ?????????? ????-???????, ??????? ?????? ???????? ??? ??????. ??? ???????? ?????, ???? ?? ?? ???????? ??????????? ????, ????? ??? ?????. ? ??? ????? ???? ??????? ???? ????????? ??????. ????????????   ????????????? ? ????? ???????????? ????? ??? ??????? ????? ??????? ??????. ???????? ? ?????? ???????? ?????, ????? ???? ???????? ??????????? ??? ???.  ????????? ???, ??????? ????????????? ??????????? ?????????.  ??????? ???????? ??????? ?????? ? ??????? ???. ? ????? ?? ????????? ? ??????? ????? ?????????? ????????, ??????????? ????? ? ?????-???? ?????? ????????????? ??? ???????????? ?? ??????????. ??? ??????? ????????????? ????????? ??????????????? ? ????. ? ????????? ??????????? ??????? ??? ????????. ????????? ? ?????????????? ?? ??????? ???? ???? ??? ?????? ???.  ????????? ??????? ?????????? ?????????? ?? ?????????? ??? ??????? ????? ??????? ??????.  ?? ?????????? ???????? ???????  10 ?????? (4,5 ??), ???? ? ??? ??????? ???????? ????????. ????? ? ??? ??? ????????????, ??? ??? ??? ??????? ???????? ??????? ????????, ???????? ?????????????? ???????? ???????.  ???? ?????????? ????????? ???????, ?????? ??????????? ?????????? ??????? ???????, ??????? ???????? ? ???? ?????????: ? ???????? ???? ? ???????. ? ??????? ???? ????? ? ????????. ? ????????? ????????? ????????. ????? ????????  ????????????? ?????????? ???. ?????????? ??? ????????? ?????????????? ???????? ?? ?????.  ?????? ??????? ?????????????? ?????. ?????????? ?? ?????? ??????? ???????.  ?????????? ?? ????? ?? ??????? ?????? ??? ???????  ??????? ???????. ?????????? ??????? ??????, ????? ???????????? ?????, ????? ?? ?????, ????? ?????????????? ?????????? ????.  ????????? ?? ??? ?????? ???????????? ?????????? ??? ??????? ????? ??????? ??????. ??? ?????. ?????????? ? ???????? ?????, ????:  ? ??? ???????? ????, ???????: ? ????? ??? ?? ????? ???. ? ????????????, ????? ?? ??????. ? ??????? ???, ??????? ? ??? ???? ? ???????. ? ?????? ????? 4 ??????.  ? ??? ???????????? ???????? ????. ?????????? ?????????? ?? ???????, ????:  ?? ?? ?????? ?????????????? ?????????????? ??? ??????????? ????????? (??????????).  ? ???: ? ???????? ? ????????, ????, ???????? ??? ?????, ??????? ????????????. ? ???????? ??? ???? ? ??????? ?????. ? ???????? ??????, ????? ?? ????????. ??????  ???????? - ??? ????, ??????????????????, ???????? ??? ??????????? ? ???????? ????? ??????????? ?????.  ???????? ????? ????????? ???????? ???????? ?? ?????????? ???? ??? ??? ??????????.  ???????? ????? ???????? ????, ???????? ??? ??????????? ? ????????, ?????, ?????? ? ????????.  ??????? ????? ???????? ?????, ?????????? ??????????, ????? ????????????? ?????????? ? ????????????? ???? ??? ?????. ??? ?????????? ?? ????? ???????? ??????, ??????????????? ????? ??????. ??????????? ???????? ????? ???????????? ??? ??????? ? ????? ??????? ??????. Document Released: 04/28/2010 Document Revised: 12/14/2018 Document Reviewed: 12/14/2018 Else[employer] Patient Education  2020 [employer].

## 2020-01-03 ENCOUNTER — Telehealth: Payer: Self-pay

## 2020-01-03 ENCOUNTER — Other Ambulatory Visit: Payer: Self-pay

## 2020-01-03 ENCOUNTER — Encounter: Payer: Self-pay | Admitting: Internal Medicine

## 2020-01-03 ENCOUNTER — Ambulatory Visit: Payer: Federal, State, Local not specified - PPO | Admitting: Internal Medicine

## 2020-01-03 DIAGNOSIS — M79642 Pain in left hand: Secondary | ICD-10-CM | POA: Diagnosis not present

## 2020-01-03 DIAGNOSIS — M79643 Pain in unspecified hand: Secondary | ICD-10-CM | POA: Insufficient documentation

## 2020-01-03 DIAGNOSIS — M79641 Pain in right hand: Secondary | ICD-10-CM

## 2020-01-03 LAB — URINALYSIS
Bilirubin Urine: NEGATIVE
Ketones, ur: NEGATIVE
Leukocytes,Ua: NEGATIVE
Nitrite: NEGATIVE
Specific Gravity, Urine: 1.015 (ref 1.000–1.030)
Total Protein, Urine: NEGATIVE
Urine Glucose: NEGATIVE
Urobilinogen, UA: 0.2 (ref 0.0–1.0)
pH: 7.5 (ref 5.0–8.0)

## 2020-01-03 LAB — BASIC METABOLIC PANEL
BUN: 11 mg/dL (ref 6–23)
CO2: 30 mEq/L (ref 19–32)
Calcium: 9.7 mg/dL (ref 8.4–10.5)
Chloride: 105 mEq/L (ref 96–112)
Creatinine, Ser: 0.69 mg/dL (ref 0.40–1.20)
GFR: 88.36 mL/min (ref >60.00)
Glucose, Bld: 100 mg/dL — ABNORMAL HIGH (ref 70–99)
Potassium: 3.5 mEq/L (ref 3.5–5.1)
Sodium: 142 mEq/L (ref 135–145)

## 2020-01-03 LAB — URIC ACID: Uric Acid, Serum: 3.6 mg/dL (ref 2.4–7.0)

## 2020-01-03 LAB — SEDIMENTATION RATE: Sed Rate: 19 mm/hr (ref 0–30)

## 2020-01-03 MED ORDER — MELOXICAM 15 MG PO TABS: 15.0000 mg | ORAL_TABLET | Freq: Every day | ORAL | 1 refills | Status: DC

## 2020-01-03 MED ORDER — OMEPRAZOLE 40 MG PO CPDR: 40.0000 mg | DELAYED_RELEASE_CAPSULE | Freq: Every day | ORAL | 3 refills | Status: DC

## 2020-01-03 NOTE — Telephone Encounter (Signed)
Patient calling and states that the pharmacy is telling her that they do not have the orders for the medications that were ordered for today. Please advise.  meloxicam (MOBIC) 15 MG tablet omeprazole (PRILOSEC) 40 MG capsule      [id number] - [place] ([address]), [place]

## 2020-01-03 NOTE — Progress Notes (Signed)
Subjective:  Patient ID: Priscilla Hill, female    DOB: [birth year]  Age: 54 y.o. MRN: [id number]  CC: No chief complaint on file.   HPI Priscilla Hill presents for hands pain - 27 Dec woke up w/severe hand stiffness x hrs No fam h/o RA  Outpatient Medications Prior to Visit  Medication Sig Dispense Refill  . b complex vitamins tablet Take 1 tablet by mouth daily. 100 tablet 3  . Cholecalciferol (VITAMIN D3) 50 MCG (2000 UT) capsule Take 1 capsule (2,000 Units total) by mouth daily. 100 capsule 3  . HYDROcodone-acetaminophen (NORCO/VICODIN) 5-325 MG tablet Take 0.5-1 tablets by mouth every 6 (six) hours as needed for severe pain. 20 tablet 0  . ibuprofen (ADVIL) 600 MG tablet Take twice a day x 2 weeks, then prn pain 60 tablet 1  . methylPREDNISolone (MEDROL DOSEPAK) 4 MG TBPK tablet As directed 21 tablet 0  . omeprazole (PRILOSEC) 40 MG capsule Take 1 capsule (40 mg total) by mouth daily. (Patient taking differently: Take 40 mg by mouth daily as needed. ) 30 capsule 11  . tiZANidine (ZANAFLEX) 4 MG tablet Take 1 tablet (4 mg total) by mouth every 8 (eight) hours as needed for muscle spasms. 30 tablet 1   No facility-administered medications prior to visit.    ROS: Review of Systems  Constitutional: Negative for activity change, appetite change, chills, diaphoresis, fatigue, fever and unexpected weight change.  HENT: Negative for congestion, dental problem, ear pain, hearing loss, mouth sores, postnasal drip, sinus pressure, sneezing, sore throat and voice change.   Eyes: Negative for pain and visual disturbance.  Respiratory: Negative for cough, chest tightness, wheezing and stridor.   Cardiovascular: Negative for chest pain, palpitations and leg swelling.  Gastrointestinal: Negative for abdominal distention, abdominal pain, blood in stool, nausea, rectal pain and vomiting.  Genitourinary: Negative for decreased urine volume, difficulty urinating, dysuria, frequency, hematuria,  menstrual problem, vaginal bleeding, vaginal discharge and vaginal pain.  Musculoskeletal: Positive for arthralgias. Negative for back pain, gait problem, joint swelling and neck pain.  Skin: Negative for color change, rash and wound.  Neurological: Negative for dizziness, tremors, syncope, speech difficulty, weakness and light-headedness.  Hematological: Negative for adenopathy.  Psychiatric/Behavioral: Negative for behavioral problems, confusion, decreased concentration, dysphoric mood, hallucinations, sleep disturbance and suicidal ideas. The patient is not nervous/anxious and is not hyperactive.     Objective:  BP 122/76 (BP Location: Left Arm, Patient Position: Sitting, Cuff Size: Normal)   Pulse 71   Temp 99 F (37.2 C) (Oral)   Ht 5\' 6"  (1.676 m)   Wt 139 lb (63 kg)   SpO2 97%   BMI 22.44 kg/m   BP Readings from Last 3 Encounters:  01/03/20 122/76  06/28/19 118/72  05/04/19 114/72    Wt Readings from Last 3 Encounters:  01/03/20 139 lb (63 kg)  06/28/19 136 lb (61.7 kg)  05/04/19 134 lb (60.8 kg)    Physical Exam Constitutional:      General: She is not in acute distress.    Appearance: She is well-developed.  HENT:     Head: Normocephalic.     Right Ear: External ear normal.     Left Ear: External ear normal.     Nose: Nose normal.  Eyes:     General:        Right eye: No discharge.        Left eye: No discharge.     Conjunctiva/sclera: Conjunctivae normal.  Pupils: Pupils are equal, round, and reactive to light.  Neck:     Thyroid: No thyromegaly.     Vascular: No JVD.     Trachea: No tracheal deviation.  Cardiovascular:     Rate and Rhythm: Normal rate and regular rhythm.     Heart sounds: Normal heart sounds.  Pulmonary:     Effort: No respiratory distress.     Breath sounds: No stridor. No wheezing.  Abdominal:     General: Bowel sounds are normal. There is no distension.     Palpations: Abdomen is soft. There is no mass.     Tenderness: There  is no abdominal tenderness. There is no guarding or rebound.  Musculoskeletal:        General: No tenderness.     Cervical back: Normal range of motion and neck supple.  Lymphadenopathy:     Cervical: No cervical adenopathy.  Skin:    Findings: No erythema or rash.  Neurological:     Mental Status: She is oriented to person, place, and time.     Cranial Nerves: No cranial nerve deficit.     Motor: No abnormal muscle tone.     Coordination: Coordination normal.     Deep Tendon Reflexes: Reflexes normal.  Psychiatric:        Behavior: Behavior normal.        Thought Content: Thought content normal.        Judgment: Judgment normal.   B hands wnl  Lab Results  Component Value Date   WBC 5.1 05/04/2019   HGB 13.4 05/04/2019   HCT 39.6 05/04/2019   PLT 222.0 05/04/2019   GLUCOSE 96 05/04/2019   CHOL 225 (H) 05/04/2019   TRIG 49.0 05/04/2019   HDL 65.90 05/04/2019   LDLCALC 149 (H) 05/04/2019   ALT 13 05/04/2019   AST 15 05/04/2019   NA 142 05/04/2019   K 3.9 05/04/2019   CL 106 05/04/2019   CREATININE 0.69 05/04/2019   BUN 12 05/04/2019   CO2 28 05/04/2019   TSH 1.37 05/04/2019   INR 0.94 05/23/2013    CT Abdomen Pelvis W Contrast  Result Date: 02/22/2018 CLINICAL DATA:  Intermittent right lower quadrant pain for 1 year. 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 EXAM: CT ABDOMEN AND PELVIS WITH CONTRAST TECHNIQUE: Multidetector CT imaging of the abdomen and pelvis was performed using the standard protocol following bolus administration of intravenous contrast. CONTRAST:  131mL ISOVUE-300 IOPAMIDOL (ISOVUE-300) INJECTION 61% COMPARISON:  03/24/2016 FINDINGS: Lower chest: Minor basilar atelectasis. Normal heart size. No pericardial or pleural effusion. Hepatobiliary: No focal liver abnormality is seen. No gallstones, gallbladder wall thickening, or biliary dilatation. Pancreas: Unremarkable. No pancreatic ductal dilatation or surrounding inflammatory  changes. Spleen: Normal in size without focal abnormality. Adrenals/Urinary Tract: Normal adrenal glands. Kidneys demonstrate no acute obstructive uropathy, hydronephrosis, focal abnormality. Ureters are symmetric and decompressed. Air noted within the bladder dome anteriorly, nonspecific, query recent instrumentation or catheterization. No bladder wall thickening. Stomach/Bowel: Negative for bowel obstruction, significant dilatation, ileus, or free air. Normal appendix demonstrated. Previous partial colectomy with an anastomosis in the midline pelvis along the sigmoid colon. No significant bowel wall thickening or focal abnormality by CT. Vascular/Lymphatic: No significant vascular findings are present. No enlarged abdominal or pelvic lymph nodes. Reproductive: Tubal occlusion radiopaque devices noted bilaterally. Uterus and adnexa normal in size. Slight nodular enhancement of the uterus, suspect small fibroids. No pelvic free fluid or fluid collection. Other: No abdominal wall hernia  or abnormality. No abdominopelvic ascites. Musculoskeletal: No acute or significant osseous findings. IMPRESSION: No acute abdominal or pelvic finding by CT. Stable postop changes from partial colectomy without evidence recurrence or metastatic disease. Electronically Signed   By: Jerilynn Mages.  Shick M.D.   On: 02/22/2018 08:24    Assessment & Plan:   There are no diagnoses linked to this encounter.   No orders of the defined types were placed in this encounter.    Follow-up: No follow-ups on file.  Walker Kehr, MD

## 2020-01-03 NOTE — Assessment & Plan Note (Addendum)
B hands Meloxicam and Omeprazole Labs X ray if needed

## 2020-01-03 NOTE — Telephone Encounter (Signed)
Per chart rx's was sent and we received confirmation " E-Prescribing Status: Receipt confirmed by pharmacy (01/03/2020 9:26 AM EST)". Will resend to pof../l,mb

## 2020-01-06 LAB — ANA: Anti Nuclear Antibody (ANA): POSITIVE — AB

## 2020-01-06 LAB — ANTI-NUCLEAR AB-TITER (ANA TITER): ANA Titer 1: 1:80 {titer} — ABNORMAL HIGH

## 2020-01-06 LAB — RHEUMATOID FACTOR: Rheumatoid fact SerPl-aCnc: <14 IU/mL (ref <14)

## 2020-01-08 ENCOUNTER — Other Ambulatory Visit: Payer: Self-pay | Admitting: Internal Medicine

## 2020-01-08 DIAGNOSIS — M79641 Pain in right hand: Secondary | ICD-10-CM

## 2020-01-08 DIAGNOSIS — M79642 Pain in left hand: Secondary | ICD-10-CM

## 2020-03-20 NOTE — Progress Notes (Signed)
Office Visit Note  Patient: Priscilla Hill             Date of Birth: August 12, 1966           MRN: [id number]             PCP: Cassandria Anger, MD Referring: Cassandria Anger, MD Visit Date: 03/27/2020 Occupation: _0 @  Subjective:  Pain in multiple joints.   History of Present Illness: Priscilla Hill is a 54 y.o. female seen in consultation per request of her PCP for evaluation of joint pain and positive ANA.  According to patient she has had pain in her hands for the last 10 years which has been gradually getting worse.  She is also had some discomfort in her knee joints and her feet.  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  She has been under a lot of stress as well.  She states she was recently evaluated by her PCP and labs showed positive ANA for that reason she was referred to me.  She denies any history of joint swelling.  She denies any history of oral ulcers, nasal ulcers, malar rash, photosensitivity, Raynaud's phenomenon, lymphadenopathy.  There is no family history of autoimmune disease.  She is gravida 5, para 2, miscarriages 3.  Activities of Daily Living:  Patient reports morning stiffness for 30 minutes.   Patient Reports nocturnal pain.  Difficulty dressing/grooming: Denies Difficulty climbing stairs: Denies Difficulty getting out of chair: Denies Difficulty using hands for taps, buttons, cutlery, and/or writing: Denies  Review of Systems  Constitutional: Positive for fatigue. Negative for night sweats, weight gain and weight loss.  HENT: Negative for mouth sores, trouble swallowing, trouble swallowing, mouth dryness and nose dryness.   Eyes: Positive for dryness. Negative for pain, redness and visual disturbance.  Respiratory: Negative for cough, shortness of breath and difficulty breathing.   Cardiovascular: Negative for chest pain, palpitations, hypertension, irregular heartbeat and  swelling in legs/feet.  Gastrointestinal: Positive for blood in stool. Negative for constipation and diarrhea.  Endocrine: Negative for increased urination.  Genitourinary: Negative for difficulty urinating, painful urination and vaginal dryness.  Musculoskeletal: Positive for arthralgias, joint pain and morning stiffness. Negative for joint swelling, myalgias, muscle weakness, muscle tenderness and myalgias.  Skin: Negative for color change, rash, hair loss, redness, skin tightness, ulcers and sensitivity to sunlight.  Allergic/Immunologic: Negative for susceptible to infections.  Neurological: Positive for numbness and headaches. Negative for dizziness, memory loss, night sweats and weakness.       History of chronic headaches  Hematological: Negative for bruising/bleeding tendency and swollen glands.  Psychiatric/Behavioral: Positive for sleep disturbance. Negative for depressed mood and confusion. The patient is nervous/anxious.     PMFS History:  Patient Active Problem List   Diagnosis Date Noted  . Hand pain 01/03/2020  . Lumbar radiculitis 06/28/2019  . RLQ abdominal pain 02/13/2018  . Otitis externa 01/20/2018  . Onychomycosis 01/20/2018  . Subluxation of extensor carpi ulnaris tendon, right, initial encounter 03/02/2017  . Ulnar neuropathy at wrist, right 10/13/2016  . Wrist pain, acute, right 10/13/2016  . Dizziness 02/28/2016  . Impacted cerumen of right ear 01/14/2016  . Acute sinus infection 11/12/2015  . Gastritis and gastroduodenitis 10/13/2015  . Alopecia 02/26/2015  . Hot flashes 02/26/2015  . Low back pain 11/25/2014  . Vitamin D deficiency 10/27/2014  . H. pylori infection 10/27/2014  . Nausea without  vomiting 10/16/2014  . Peripheral neuropathy, toxic 01/16/2014  . 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 Anemia due to chronic blood loss 04/25/2013  . Colon cancer-sigmoid 04/05/2013  . Antiphospholipid antibody  syndrome ([place]) 02/28/2013  . 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 Well adult exam 11/14/2012  . Hematochezia 11/14/2012  . Paresthesia 11/14/2012  . Dyslipidemia 11/14/2012  . URI, acute 05/31/2011  . Vaginitis 05/31/2011  . Anxiety 04/17/2010  . SHOULDER PAIN 04/17/2010  . THROAT PAIN, CHRONIC 03/14/2009  . DYSPHAGIA UNSPECIFIED 03/14/2009  . CONJUNCTIVITIS 12/05/2008  . ALLERGIC RHINITIS 10/08/2008  . Nonspecific (abnormal) findings on radiological and other examination of body structure 10/08/2008  . CHEST XRAY, ABNORMAL 10/08/2008  . Acute bronchitis 09/24/2008  . COUGH 09/24/2008    Past Medical History:  Diagnosis Date  . Anemia   . Anxiety   . Cancer ([place])   . GERD (gastroesophageal reflux disease)   . H. pylori infection    positive IgG serology  . History of multiple miscarriages   . History of positive PPD    never any symptoms, due to being from [place]? different "vaccine"  . Hyperlipidemia    mild    Family History  Problem Relation Age of Onset  . Hypertension Mother   . Diabetes Mother   . Heart disease Mother   . Stroke Father 65  . Hypertension Father   . Heart disease Maternal Grandmother   . Heart disease Maternal Grandfather   . Stomach cancer Other        grandmother  . Healthy Daughter   . Healthy Daughter   . Colon cancer Neg Hx    Past Surgical History:  Procedure Laterality Date  . childbirth   04-17-13   x 2 NVD  . ESSURE TUBAL LIGATION    . HYSTEROSCOPY WITH RESECTOSCOPE  04-17-13   "fibroids removed"  . LAPAROSCOPIC PARTIAL COLECTOMY N/A 04/24/2013   Procedure: LAPAROSCOPIC ASSISTED SIGMOID COLECTOMY;  Surgeon: Odis Hollingshead, MD;  Location: [facility];  Service: General;  Laterality: N/A;  . PORTACATH PLACEMENT Right 05/24/2013   Procedure: INSERTION PORT-A-CATH;  Surgeon: Odis Hollingshead, MD;  Location: [facility];  Service: General;  Laterality: Right;   Social History   Social History Narrative   Married, husband Lennette Bihari   #1 small 57  yo in home and cares for her 8 year old mother   Both her and husband have #2 grown children each from prior marriage-her's are in [employer] at [facility] [employer] in [place], [place] in ADL's and able to drive   Speaks English well   Immunization History  Administered Date(s) Administered  . MMR 07/22/2005  . Td 07/22/2005  . Tdap 02/13/2018     Objective: Vital Signs: BP 100/69 (BP Location: Right Arm, Patient Position: Sitting, Cuff Size: Normal)   Pulse 76   Resp 14   Ht _0  (1.651 m)   Wt 138 lb 9.6 oz (62.9 kg)   BMI 23.06 kg/m    Physical Exam Vitals and nursing note reviewed.  Constitutional:      Appearance: She is well-developed.  HENT:     Head: Normocephalic and atraumatic.  Eyes:     Conjunctiva/sclera: Conjunctivae normal.  Cardiovascular:     Rate and Rhythm: Normal rate and regular rhythm.     Heart sounds: Normal heart sounds.  Pulmonary:     Effort: Pulmonary effort is normal.  Breath sounds: Normal breath sounds.  Abdominal:     General: Bowel sounds are normal.     Palpations: Abdomen is soft.  Musculoskeletal:     Cervical back: Normal range of motion.  Lymphadenopathy:     Cervical: No cervical adenopathy.  Skin:    General: Skin is warm and dry.     Capillary Refill: Capillary refill takes less than 2 seconds.  Neurological:     Mental Status: She is alert and oriented to person, place, and time.  Psychiatric:        Behavior: Behavior normal.      Musculoskeletal Exam: C-spine thoracic and lumbar spine were in good range of motion.  Shoulder joints, elbow joints, wrist joints with good range of motion.  She has mild PIP and DIP thickening with no synovitis.  Hip joints, knee joints, ankles, MTPs and PIPs with good range of motion.  She has no warmth swelling or effusion in her knee joints.  She has thickening of bilateral first MTP joints and PIP and DIP joints with no synovitis.  CDAI Exam: CDAI Score:  -- Patient Global: --; Provider Global: -- Swollen: --; Tender: -- Joint Exam 03/27/2020   No joint exam has been documented for this visit   There is currently no information documented on the homunculus. Go to the Rheumatology activity and complete the homunculus joint exam.  Investigation: No additional findings.  Imaging: No results found.  Recent Labs: Lab Results  Component Value Date   WBC 5.1 05/04/2019   HGB 13.4 05/04/2019   PLT 222.0 05/04/2019   NA 142 01/03/2020   K 3.5 01/03/2020   CL 105 01/03/2020   CO2 30 01/03/2020   GLUCOSE 100 (H) 01/03/2020   BUN 11 01/03/2020   CREATININE 0.69 01/03/2020   BILITOT 0.8 05/04/2019   ALKPHOS 63 05/04/2019   AST 15 05/04/2019   ALT 13 05/04/2019   PROT 7.2 05/04/2019   ALBUMIN 4.5 05/04/2019   CALCIUM 9.7 01/03/2020   GFRAA >90 05/23/2013    Speciality Comments: No specialty comments available.  Procedures:  No procedures performed Allergies: Patient has no known allergies.   Assessment / Plan:     Visit Diagnoses: Pain in both hands -patient complains of pain and discomfort in her bilateral hands for many years.  She states the pain is progressively getting worse.  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  She has been under a lot of stress.  She denies any joint swelling.  I did not see any synovitis on examination.  Clinical findings are consistent with osteoarthritis.  Plan: XR Hand 2 View Right, XR Hand 2 View Left.  X-ray findings are consistent with osteoarthritis of the hands.  Chronic pain of both knees -she complains of pain in bilateral knee joints.  No warmth swelling or effusion was noted.  Plan: XR KNEE 3 VIEW RIGHT, XR KNEE 3 VIEW LEFT.  3 showed mild osteoarthritis in the right knee.  Left knee joint x-ray was unremarkable.  Pain in both feet -she complains of discomfort in her bilateral knee joints.  She has bilateral first MTP, PIP and DIP thickening.  Clinical findings  were consistent with osteoarthritis.  Plan: XR Foot 2 Views Right, XR Foot 2 Views Left.  X-ray findings are consistent with osteoarthritis of the feet.  Positive ANA (antinuclear antibody) - 01/03/20: ANA 1:80 nuclear, dense fine speckled, RF<14, ESR 19, uric acid 3.6.  ANA is low titer.  I will obtainAVISE labs to evaluate further.  She has no other clinical features of systemic lupus.  She complains of some sicca symptoms.  Alopecia-she has had intermittent hair loss.  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  She has a x-ray pending for lumbar spine ordered by her PCP.  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  Gastritis and gastroduodenitis  Dyslipidemia  Onychomycosis  Anemia due to chronic blood loss  Vitamin D deficiency  History of positive PPD  Orders: Orders Placed This Encounter  Procedures  . XR KNEE 3 VIEW RIGHT  . XR KNEE 3 VIEW LEFT  . XR Hand 2 View Right  . XR Hand 2 View Left  . XR Foot 2 Views Right  . XR Foot 2 Views Left   No orders of the defined types were placed in this encounter.   Face-to-face time spent with patient was 45 minutes. Greater than 50% of time was spent in counseling and coordination of care.  Follow-Up Instructions: Return for Polyarthralgia, positive ANA.   Bo Merino, MD  Note - This record has been created using Editor, commissioning.  Chart creation errors have been sought, but may not always  have been located. Such creation errors do not reflect on  the standard of medical care.

## 2020-03-27 ENCOUNTER — Ambulatory Visit: Payer: Self-pay

## 2020-03-27 ENCOUNTER — Other Ambulatory Visit: Payer: Self-pay

## 2020-03-27 ENCOUNTER — Ambulatory Visit: Payer: Federal, State, Local not specified - PPO | Admitting: Rheumatology

## 2020-03-27 ENCOUNTER — Encounter: Payer: Self-pay | Admitting: Rheumatology

## 2020-03-27 VITALS — BP 100/69 | HR 76 | Resp 14 | Ht 65.0 in | Wt 138.6 lb

## 2020-03-27 DIAGNOSIS — M79672 Pain in left foot: Secondary | ICD-10-CM

## 2020-03-27 DIAGNOSIS — G5621 Lesion of ulnar nerve, right upper limb: Secondary | ICD-10-CM

## 2020-03-27 DIAGNOSIS — S63091A Other subluxation of right wrist and hand, initial encounter: Secondary | ICD-10-CM

## 2020-03-27 DIAGNOSIS — C187 Malignant neoplasm of sigmoid colon: Secondary | ICD-10-CM

## 2020-03-27 DIAGNOSIS — E785 Hyperlipidemia, unspecified: Secondary | ICD-10-CM

## 2020-03-27 DIAGNOSIS — M79641 Pain in right hand: Secondary | ICD-10-CM | POA: Diagnosis not present

## 2020-03-27 DIAGNOSIS — M25562 Pain in left knee: Secondary | ICD-10-CM | POA: Diagnosis not present

## 2020-03-27 DIAGNOSIS — Z9289 Personal history of other medical treatment: Secondary | ICD-10-CM

## 2020-03-27 DIAGNOSIS — R768 Other specified abnormal immunological findings in serum: Secondary | ICD-10-CM | POA: Diagnosis not present

## 2020-03-27 DIAGNOSIS — K2990 Gastroduodenitis, unspecified, without bleeding: Secondary | ICD-10-CM

## 2020-03-27 DIAGNOSIS — G8929 Other chronic pain: Secondary | ICD-10-CM | POA: Diagnosis not present

## 2020-03-27 DIAGNOSIS — M5416 Radiculopathy, lumbar region: Secondary | ICD-10-CM

## 2020-03-27 DIAGNOSIS — D500 Iron deficiency anemia secondary to blood loss (chronic): Secondary | ICD-10-CM

## 2020-03-27 DIAGNOSIS — M25561 Pain in right knee: Secondary | ICD-10-CM | POA: Diagnosis not present

## 2020-03-27 DIAGNOSIS — B351 Tinea unguium: Secondary | ICD-10-CM

## 2020-03-27 DIAGNOSIS — G620 Drug-induced polyneuropathy: Secondary | ICD-10-CM

## 2020-03-27 DIAGNOSIS — M79671 Pain in right foot: Secondary | ICD-10-CM

## 2020-03-27 DIAGNOSIS — D6861 Antiphospholipid syndrome: Secondary | ICD-10-CM

## 2020-03-27 DIAGNOSIS — M79642 Pain in left hand: Secondary | ICD-10-CM | POA: Diagnosis not present

## 2020-03-27 DIAGNOSIS — L659 Nonscarring hair loss, unspecified: Secondary | ICD-10-CM

## 2020-03-27 DIAGNOSIS — K2970 Gastritis, unspecified, without bleeding: Secondary | ICD-10-CM

## 2020-03-27 DIAGNOSIS — N96 Recurrent pregnancy loss: Secondary | ICD-10-CM

## 2020-03-27 DIAGNOSIS — E559 Vitamin D deficiency, unspecified: Secondary | ICD-10-CM

## 2020-03-27 NOTE — Patient Instructions (Signed)
Journal for Nurse Practitioners, 15(4), 263-267. Retrieved August 28, 2018 from [email or web address].[email or web address]">  Knee Exercises Ask your health care provider which exercises are safe for you. Do exercises exactly as told by your health care provider and adjust them as directed. It is normal to feel mild stretching, pulling, tightness, or discomfort as you do these exercises. Stop right away if you feel sudden pain or your pain gets worse. Do not begin these exercises until told by your health care provider. Stretching and range-of-motion exercises These exercises warm up your muscles and joints and improve the movement and flexibility of your knee. These exercises also help to relieve pain and swelling. Knee extension, prone 1. Lie on your abdomen (prone position) on a bed. 2. Place your left / right knee just beyond the edge of the surface so your knee is not on the bed. You can put a towel under your left / right thigh just above your kneecap for comfort. 3. Relax your leg muscles and allow gravity to straighten your knee (extension). You should feel a stretch behind your left / right knee. 4. Hold this position for __________ seconds. 5. Scoot up so your knee is supported between repetitions. Repeat __________ times. Complete this exercise __________ times a day. Knee flexion, active  1. Lie on your back with both legs straight. If this causes back discomfort, bend your left / right knee so your foot is flat on the floor. 2. Slowly slide your left / right heel back toward your buttocks. Stop when you feel a gentle stretch in the front of your knee or thigh (flexion). 3. Hold this position for __________ seconds. 4. Slowly slide your left / right heel back to the starting position. Repeat __________ times. Complete this exercise __________ times a day. Quadriceps stretch, prone  1. Lie on your abdomen on a firm surface, such as a bed or padded floor. 2. Bend your left / right knee and hold  your ankle. If you cannot reach your ankle or pant leg, loop a belt around your foot and grab the belt instead. 3. Gently pull your heel toward your buttocks. Your knee should not slide out to the side. You should feel a stretch in the front of your thigh and knee (quadriceps). 4. Hold this position for __________ seconds. Repeat __________ times. Complete this exercise __________ times a day. Hamstring, supine 1. Lie on your back (supine position). 2. Loop a belt or towel over the ball of your left / right foot. The ball of your foot is on the walking surface, right under your toes. 3. Straighten your left / right knee and slowly pull on the belt to raise your leg until you feel a gentle stretch behind your knee (hamstring). ? Do not let your knee bend while you do this. ? Keep your other leg flat on the floor. 4. Hold this position for __________ seconds. Repeat __________ times. Complete this exercise __________ times a day. Strengthening exercises These exercises build strength and endurance in your knee. Endurance is the ability to use your muscles for a long time, even after they get tired. Quadriceps, isometric This exercise stretches the muscles in front of your thigh (quadriceps) without moving your knee joint (isometric). 1. Lie on your back with your left / right leg extended and your other knee bent. Put a rolled towel or small pillow under your knee if told by your health care provider. 2. Slowly tense the muscles in the front of your left /   right thigh. You should see your kneecap slide up toward your hip or see increased dimpling just above the knee. This motion will push the back of the knee toward the floor. 3. For __________ seconds, hold the muscle as tight as you can without increasing your pain. 4. Relax the muscles slowly and completely. Repeat __________ times. Complete this exercise __________ times a day. Straight leg raises This exercise stretches the muscles in front  of your thigh (quadriceps) and the muscles that move your hips (hip flexors). 1. Lie on your back with your left / right leg extended and your other knee bent. 2. Tense the muscles in the front of your left / right thigh. You should see your kneecap slide up or see increased dimpling just above the knee. Your thigh may even shake a bit. 3. Keep these muscles tight as you raise your leg 4-6 inches (10-15 cm) off the floor. Do not let your knee bend. 4. Hold this position for __________ seconds. 5. Keep these muscles tense as you lower your leg. 6. Relax your muscles slowly and completely after each repetition. Repeat __________ times. Complete this exercise __________ times a day. Hamstring, isometric 1. Lie on your back on a firm surface. 2. Bend your left / right knee about __________ degrees. 3. Dig your left / right heel into the surface as if you are trying to pull it toward your buttocks. Tighten the muscles in the back of your thighs (hamstring) to "dig" as hard as you can without increasing any pain. 4. Hold this position for __________ seconds. 5. Release the tension gradually and allow your muscles to relax completely for __________ seconds after each repetition. Repeat __________ times. Complete this exercise __________ times a day. Hamstring curls If told by your health care provider, do this exercise while wearing ankle weights. Begin with __________ lb weights. Then increase the weight by 1 lb (0.5 kg) increments. Do not wear ankle weights that are more than __________ lb. 1. Lie on your abdomen with your legs straight. 2. Bend your left / right knee as far as you can without feeling pain. Keep your hips flat against the floor. 3. Hold this position for __________ seconds. 4. Slowly lower your leg to the starting position. Repeat __________ times. Complete this exercise __________ times a day. Squats This exercise strengthens the muscles in front of your thigh and knee  (quadriceps). 1. Stand in front of a table, with your feet and knees pointing straight ahead. You may rest your hands on the table for balance but not for support. 2. Slowly bend your knees and lower your hips like you are going to sit in a chair. ? Keep your weight over your heels, not over your toes. ? Keep your lower legs upright so they are parallel with the table legs. ? Do not let your hips go lower than your knees. ? Do not bend lower than told by your health care provider. ? If your knee pain increases, do not bend as low. 3. Hold the squat position for __________ seconds. 4. Slowly push with your legs to return to standing. Do not use your hands to pull yourself to standing. Repeat __________ times. Complete this exercise __________ times a day. Wall slides This exercise strengthens the muscles in front of your thigh and knee (quadriceps). 1. Lean your back against a smooth wall or door, and walk your feet out 18-24 inches (46-61 cm) from it. 2. Place your feet hip-width apart. 3.   Slowly slide down the wall or door until your knees bend __________ degrees. Keep your knees over your heels, not over your toes. Keep your knees in line with your hips. 4. Hold this position for __________ seconds. Repeat __________ times. Complete this exercise __________ times a day. Straight leg raises This exercise strengthens the muscles that rotate the leg at the hip and move it away from your body (hip abductors). 1. Lie on your side with your left / right leg in the top position. Lie so your head, shoulder, knee, and hip line up. You may bend your bottom knee to help you keep your balance. 2. Roll your hips slightly forward so your hips are stacked directly over each other and your left / right knee is facing forward. 3. Leading with your heel, lift your top leg 4-6 inches (10-15 cm). You should feel the muscles in your outer hip lifting. ? Do not let your foot drift forward. ? Do not let your knee  roll toward the ceiling. 4. Hold this position for __________ seconds. 5. Slowly return your leg to the starting position. 6. Let your muscles relax completely after each repetition. Repeat __________ times. Complete this exercise __________ times a day. Straight leg raises This exercise stretches the muscles that move your hips away from the front of the pelvis (hip extensors). 1. Lie on your abdomen on a firm surface. You can put a pillow under your hips if that is more comfortable. 2. Tense the muscles in your buttocks and lift your left / right leg about 4-6 inches (10-15 cm). Keep your knee straight as you lift your leg. 3. Hold this position for __________ seconds. 4. Slowly lower your leg to the starting position. 5. Let your leg relax completely after each repetition. Repeat __________ times. Complete this exercise __________ times a day. This information is not intended to replace advice given to you by your health care provider. Make sure you discuss any questions you have with your health care provider. Document Revised: 08/29/2018 Document Reviewed: 08/29/2018 [employer] Patient Education  2020 [employer] Hand Exercises Hand exercises can be helpful for almost anyone. These exercises can strengthen the hands, improve flexibility and movement, and increase blood flow to the hands. These results can make work and daily tasks easier. Hand exercises can be especially helpful for people who have joint pain from arthritis or have nerve damage from overuse (carpal tunnel syndrome). These exercises can also help people who have injured a hand. Exercises Most of these hand exercises are gentle stretching and motion exercises. It is usually safe to do them often throughout the day. Warming up your hands before exercise may help to reduce stiffness. You can do this with gentle massage or by placing your hands in warm water for 10-15 minutes. It is normal to feel some stretching, pulling,  tightness, or mild discomfort as you begin new exercises. This will gradually improve. Stop an exercise right away if you feel sudden, severe pain or your pain gets worse. Ask your health care provider which exercises are best for you. Knuckle bend or "claw" fist 1. Stand or sit with your arm, hand, and all five fingers pointed straight up. Make sure to keep your wrist straight during the exercise. 2. Gently bend your fingers down toward your palm until the tips of your fingers are touching the top of your palm. Keep your big knuckle straight and just bend the small knuckles in your fingers. 3. Hold this position for   __________ seconds. 4. Straighten (extend) your fingers back to the starting position. Repeat this exercise 5-10 times with each hand. Full finger fist 1. Stand or sit with your arm, hand, and all five fingers pointed straight up. Make sure to keep your wrist straight during the exercise. 2. Gently bend your fingers into your palm until the tips of your fingers are touching the middle of your palm. 3. Hold this position for __________ seconds. 4. Extend your fingers back to the starting position, stretching every joint fully. Repeat this exercise 5-10 times with each hand. Straight fist 1. Stand or sit with your arm, hand, and all five fingers pointed straight up. Make sure to keep your wrist straight during the exercise. 2. Gently bend your fingers at the big knuckle, where your fingers meet your hand, and the middle knuckle. Keep the knuckle at the tips of your fingers straight and try to touch the bottom of your palm. 3. Hold this position for __________ seconds. 4. Extend your fingers back to the starting position, stretching every joint fully. Repeat this exercise 5-10 times with each hand. Tabletop 1. Stand or sit with your arm, hand, and all five fingers pointed straight up. Make sure to keep your wrist straight during the exercise. 2. Gently bend your fingers at the big  knuckle, where your fingers meet your hand, as far down as you can while keeping the small knuckles in your fingers straight. Think of forming a tabletop with your fingers. 3. Hold this position for __________ seconds. 4. Extend your fingers back to the starting position, stretching every joint fully. Repeat this exercise 5-10 times with each hand. Finger spread 1. Place your hand flat on a table with your palm facing down. Make sure your wrist stays straight as you do this exercise. 2. Spread your fingers and thumb apart from each other as far as you can until you feel a gentle stretch. Hold this position for __________ seconds. 3. Bring your fingers and thumb tight together again. Hold this position for __________ seconds. Repeat this exercise 5-10 times with each hand. Making circles 1. Stand or sit with your arm, hand, and all five fingers pointed straight up. Make sure to keep your wrist straight during the exercise. 2. Make a circle by touching the tip of your thumb to the tip of your index finger. 3. Hold for __________ seconds. Then open your hand wide. 4. Repeat this motion with your thumb and each finger on your hand. Repeat this exercise 5-10 times with each hand. Thumb motion 1. Sit with your forearm resting on a table and your wrist straight. Your thumb should be facing up toward the ceiling. Keep your fingers relaxed as you move your thumb. 2. Lift your thumb up as high as you can toward the ceiling. Hold for __________ seconds. 3. Bend your thumb across your palm as far as you can, reaching the tip of your thumb for the small finger (pinkie) side of your palm. Hold for __________ seconds. Repeat this exercise 5-10 times with each hand. Grip strengthening  1. Hold a stress ball or other soft ball in the middle of your hand. 2. Slowly increase the pressure, squeezing the ball as much as you can without causing pain. Think of bringing the tips of your fingers into the middle of  your palm. All of your finger joints should bend when doing this exercise. 3. Hold your squeeze for __________ seconds, then relax. Repeat this exercise 5-10 times with each   hand. Contact a health care provider if:  Your hand pain or discomfort gets much worse when you do an exercise.  Your hand pain or discomfort does not improve within 2 hours after you exercise. If you have any of these problems, stop doing these exercises right away. Do not do them again unless your health care provider says that you can. Get help right away if:  You develop sudden, severe hand pain or swelling. If this happens, stop doing these exercises right away. Do not do them again unless your health care provider says that you can. This information is not intended to replace advice given to you by your health care provider. Make sure you discuss any questions you have with your health care provider. Document Revised: 03/01/2019 Document Reviewed: 11/09/2018 Elsevier Patient Education  2020 [employer]  

## 2020-03-31 ENCOUNTER — Encounter: Payer: Self-pay | Admitting: Rheumatology

## 2020-04-02 ENCOUNTER — Encounter: Payer: Self-pay | Admitting: Nurse Practitioner

## 2020-04-02 ENCOUNTER — Inpatient Hospital Stay: Payer: Federal, State, Local not specified - PPO | Attending: Nurse Practitioner

## 2020-04-02 ENCOUNTER — Telehealth: Payer: Self-pay | Admitting: Nurse Practitioner

## 2020-04-02 ENCOUNTER — Other Ambulatory Visit: Payer: Self-pay

## 2020-04-02 ENCOUNTER — Inpatient Hospital Stay: Payer: Federal, State, Local not specified - PPO | Admitting: Nurse Practitioner

## 2020-04-02 VITALS — BP 113/78 | HR 72 | Temp 97.8°F | Resp 18 | Ht 65.0 in | Wt 139.1 lb

## 2020-04-02 DIAGNOSIS — C187 Malignant neoplasm of sigmoid colon: Secondary | ICD-10-CM | POA: Diagnosis not present

## 2020-04-02 DIAGNOSIS — Z85038 Personal history of other malignant neoplasm of large intestine: Secondary | ICD-10-CM | POA: Diagnosis not present

## 2020-04-02 DIAGNOSIS — Z9221 Personal history of antineoplastic chemotherapy: Secondary | ICD-10-CM | POA: Insufficient documentation

## 2020-04-02 LAB — CEA (IN HOUSE-CHCC): CEA (CHCC-In House): <1 ng/mL (ref 0.00–5.00)

## 2020-04-02 NOTE — Telephone Encounter (Signed)
Scheduled per los. Gave avs and calendar  

## 2020-04-02 NOTE — Progress Notes (Signed)
[place] OFFICE PROGRESS NOTE   Diagnosis: Colon cancer  INTERVAL HISTORY:   Priscilla Hill returns as scheduled.  She has occasional constipation, typically after eating meat.  For the past 1 to 2 years she has intermittently noticed blood on the toilet tissue after a bowel movement.  She denies having hemorrhoids.  No abdominal pain.  No nausea or vomiting.  Objective:  Vital signs in last 24 hours:  Blood pressure 113/78, pulse 72, temperature 97.8 F (36.6 C), temperature source Temporal, resp. rate 18, height 5' 5"  (1.651 m), weight 139 lb 1.6 oz (63.1 kg), SpO2 100 %.    HEENT: Neck without mass. Lymphatics: No palpable cervical, supraclavicular, right axillary or inguinal lymph nodes.  Small, soft mobile left axillary lymph node. Resp: Lungs clear bilaterally. Cardio: Regular rate and rhythm. GI: Abdomen soft and nontender.  No hepatomegaly.  No mass. Vascular: No leg edema.  Calves soft and nontender.   Lab Results:  Lab Results  Component Value Date   WBC 5.1 05/04/2019   HGB 13.4 05/04/2019   HCT 39.6 05/04/2019   MCV 92.2 05/04/2019   PLT 222.0 05/04/2019   NEUTROABS 3.0 05/04/2019    Imaging:  No results found.  Medications: I have reviewed the patient's current medications.  Assessment/Plan: 1. 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 The tumor returned microsatellite stable with no loss of mismatch repair protein expression.  Cycle 1 adjuvant FOLFOX chemotherapy 05/28/2013.   Oxaliplatin was held beginning with cycle 8 due to neuropathy symptoms.   Final cycle of 5-fluorouracil/leucovorin given on 10/29/2013.  03/04/2014 CEA less than 0.5.  04/02/2014 CT chest/abdomen/pelvis negative for metastatic disease.  Colonoscopy 04/17/2014-negative  04/07/2015 CT chest/abdomen/pelvis negative for metastatic disease  CTs of the chest, abdomen, and pelvis  03/24/2016-negative for recurrent colon cancer  Surveillance colonoscopy 05/20/2017- 8 mm polyp in the ascending colon, 5 mm polyp in the transverse colon (Surgical [P], ascending, transverse, polyps (2) SESSILE SERRATED POLYP (FIVE FRAGMENTS). NO HIGH GRADE DYSPLASIA OR MALIGNANCY.  CT abdomen/pelvis 02/21/2018 to evaluate intermittent right lower quadrant pain- no evidence of recurrent or metastatic disease. 2. History of microcytic anemia.  3. Port-A-Cath placement 05/24/2013. 4. History of uterine fibroids. 5. Nausea following cycle 1 FOLFOX. Aloxi and prophylactic Decadron were added with cycle 2. She had nausea following cycle 2. Emend was added with cycle 3. Improved. No nausea following cycle 5 or 6. 6. Mild neutropenia secondary chemotherapy. She received Neulasta beginning with cycle 4 FOLFOX  7. Abdominal discomfort following cycle 4 FOLFOX. Question gastritis related to steroids. She began Prilosec 20 mg daily. 8. History of mild thrombocytopenia secondary chemotherapy. 9. Pruritus and erythema over the palms beginning day 4 following cycle 5 FOLFOX and lasting approximately 3 days. Question hand-foot syndrome related to 5-fluorouracil, question allergic reaction. She was instructed to contact the office if she experiences this again. 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 Soft mobile left axillary lymph node on exam 10/07/2014. Stable on exam 04/09/2015. Not noted on exam 10/06/2015 or 03/29/2016.  Present on exam 04/03/2018, 04/02/2020.  Disposition: Priscilla Hill remains in clinical remission from colon cancer.  We will follow-up on the CEA from today.  At today's appointment she reports intermittent rectal bleeding for the past 1 to 2 years.  We made a referral to Dr. Havery Moros.  She will return for lab and follow-up in 1 year.  She will  contact the office in the interim with any problems.  Plan reviewed with Dr.  Benay Spice.    Ned Card ANP/GNP-BC   04/02/2020  11:26 AM

## 2020-04-11 NOTE — Progress Notes (Signed)
Office Visit Note  Patient: Priscilla Hill             Date of Birth: [birth year]           MRN: [id number]             PCP: Cassandria Anger, MD Referring: Cassandria Anger, MD Visit Date: 04/24/2020 Occupation: _0 @  Subjective:  Pain in multiple joints.   History of Present Illness: Priscilla Hill is a 54 y.o. female with history of osteoarthritis and sicca symptoms.  She states she continues to have pain and discomfort in her bilateral hands, her knee joints and her feet.  She also has dry mouth and dry eyes which is tolerable.  She states she uses eyedrops and drinks water all day.  She denies any history of oral ulcers, nasal ulcers, malar rash, photosensitivity or Raynaud's phenomenon.  Activities of Daily Living:  Patient reports morning stiffness for 1 hour.   Patient Denies nocturnal pain.  Difficulty dressing/grooming: Denies Difficulty climbing stairs: Denies Difficulty getting out of chair: Reports Difficulty using hands for taps, buttons, cutlery, and/or writing: Denies  Review of Systems  Constitutional: Positive for fatigue. Negative for night sweats, weight gain and weight loss.  HENT: Positive for mouth dryness. Negative for mouth sores, trouble swallowing, trouble swallowing and nose dryness.   Eyes: Positive for dryness. Negative for pain, redness and visual disturbance.  Respiratory: Negative for cough, shortness of breath and difficulty breathing.   Cardiovascular: Negative for chest pain, palpitations, hypertension, irregular heartbeat and swelling in legs/feet.  Gastrointestinal: Positive for constipation. Negative for blood in stool and diarrhea.  Endocrine: Negative for heat intolerance and increased urination.  Genitourinary: Negative for difficulty urinating and vaginal dryness.  Musculoskeletal: Positive for arthralgias, gait problem, joint pain and morning stiffness. Negative for joint swelling, myalgias, muscle weakness, muscle tenderness  and myalgias.  Skin: Negative for color change, rash, hair loss, skin tightness, ulcers and sensitivity to sunlight.  Allergic/Immunologic: Negative for susceptible to infections.  Neurological: Negative for dizziness, numbness, memory loss, night sweats and weakness.  Hematological: Negative for bruising/bleeding tendency and swollen glands.  Psychiatric/Behavioral: Positive for sleep disturbance. Negative for depressed mood. The patient is not nervous/anxious.     PMFS History:  Patient Active Problem List   Diagnosis Date Noted  . Hand pain 01/03/2020  . Lumbar radiculitis 06/28/2019  . RLQ abdominal pain 02/13/2018  . Otitis externa 01/20/2018  . Onychomycosis 01/20/2018  . Subluxation of extensor carpi ulnaris tendon, right, initial encounter 03/02/2017  . Ulnar neuropathy at wrist, right 10/13/2016  . Wrist pain, acute, right 10/13/2016  . Dizziness 02/28/2016  . Impacted cerumen of right ear 01/14/2016  . Acute sinus infection 11/12/2015  . Gastritis and gastroduodenitis 10/13/2015  . Alopecia 02/26/2015  . Hot flashes 02/26/2015  . Low back pain 11/25/2014  . Vitamin D deficiency 10/27/2014  . H. pylori infection 10/27/2014  . Nausea without vomiting 10/16/2014  . Peripheral neuropathy, toxic 01/16/2014  . 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 Anemia due to chronic blood loss 04/25/2013  . Colon cancer-sigmoid 04/05/2013  . Antiphospholipid antibody syndrome ([place]) 02/28/2013  . 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 Well adult exam 11/14/2012  . Hematochezia 11/14/2012  . Paresthesia 11/14/2012  . Dyslipidemia 11/14/2012  . URI, acute 05/31/2011  . Vaginitis 05/31/2011  . Anxiety 04/17/2010  . SHOULDER PAIN 04/17/2010  . THROAT PAIN, CHRONIC 03/14/2009  . DYSPHAGIA UNSPECIFIED 03/14/2009  .  CONJUNCTIVITIS 12/05/2008  . ALLERGIC RHINITIS 10/08/2008  . Nonspecific (abnormal) findings on radiological and other examination of  body structure 10/08/2008  . CHEST XRAY, ABNORMAL 10/08/2008  . Acute bronchitis 09/24/2008  . COUGH 09/24/2008    Past Medical History:  Diagnosis Date  . Anemia   . Anxiety   . Cancer ([place])   . GERD (gastroesophageal reflux disease)   . H. pylori infection    positive IgG serology  . History of multiple miscarriages   . History of positive PPD    never any symptoms, due to being from [place]? different "vaccine"  . Hyperlipidemia    mild    Family History  Problem Relation Age of Onset  . Hypertension Mother   . Diabetes Mother   . Heart disease Mother   . Stroke Father 64  . Hypertension Father   . Heart disease Maternal Grandmother   . Heart disease Maternal Grandfather   . Stomach cancer Other        grandmother  . Healthy Daughter   . Healthy Daughter   . Colon cancer Neg Hx    Past Surgical History:  Procedure Laterality Date  . childbirth   04-17-13   x 2 NVD  . ESSURE TUBAL LIGATION    . HYSTEROSCOPY WITH RESECTOSCOPE  04-17-13   "fibroids removed"  . LAPAROSCOPIC PARTIAL COLECTOMY N/A 04/24/2013   Procedure: LAPAROSCOPIC ASSISTED SIGMOID COLECTOMY;  Surgeon: Odis Hollingshead, MD;  Location: [facility];  Service: General;  Laterality: N/A;  . PORTACATH PLACEMENT Right 05/24/2013   Procedure: INSERTION PORT-A-CATH;  Surgeon: Odis Hollingshead, MD;  Location: [facility];  Service: General;  Laterality: Right;   Social History   Social History Narrative   Married, husband Lennette Bihari   #1 small 36 yo in home and cares for her 51 year old mother   Both her and husband have #2 grown children each from prior marriage-her's are in [place] at [facility] in [place], [place] in ADL's and able to drive   Speaks English well   Immunization History  Administered Date(s) Administered  . MMR 07/22/2005  . Td 07/22/2005  . Tdap 02/13/2018     Objective: Vital Signs: BP 115/72 (BP Location: Left Arm, Patient Position: Sitting, Cuff Size:  Normal)   Pulse 69   Resp 14   Ht _0  (1.651 m)   Wt 141 lb 12.8 oz (64.3 kg)   BMI 23.60 kg/m    Physical Exam Vitals and nursing note reviewed.  Constitutional:      Appearance: She is well-developed.  HENT:     Head: Normocephalic and atraumatic.  Eyes:     Conjunctiva/sclera: Conjunctivae normal.  Cardiovascular:     Rate and Rhythm: Normal rate and regular rhythm.     Heart sounds: Normal heart sounds.  Pulmonary:     Effort: Pulmonary effort is normal.     Breath sounds: Normal breath sounds.  Abdominal:     General: Bowel sounds are normal.     Palpations: Abdomen is soft.  Musculoskeletal:     Cervical back: Normal range of motion.  Lymphadenopathy:     Cervical: No cervical adenopathy.  Skin:    General: Skin is warm and dry.     Capillary Refill: Capillary refill takes less than 2 seconds.  Neurological:     Mental Status: She is alert and oriented to person, place, and time.  Psychiatric:  Behavior: Behavior normal.      Musculoskeletal Exam: C-spine, thoracic and lumbar spine with good range of motion.  She had no SI joint tenderness.  Shoulder joints, elbow joints, wrist joints with good range of motion.  She has bilateral PIP and DIP thickening in her hands and her feet.  She is good range of motion of her hip joints and knee joints.  No warmth swelling or effusion was noted.  CDAI Exam: CDAI Score: -- Patient Global: --; Provider Global: -- Swollen: --; Tender: -- Joint Exam 04/24/2020   No joint exam has been documented for this visit   There is currently no information documented on the homunculus. Go to the Rheumatology activity and complete the homunculus joint exam.  Investigation: No additional findings.  Imaging: XR Foot 2 Views Left  Result Date: 03/27/2020 First MTP severe narrowing was noted.  PIP and DIP narrowing was noted.  No intertarsal tibiotalar joint space narrowing was noted.  Small calcaneal spur was noted.  No erosive  changes were noted. Impression: These findings are consistent with osteoarthritis of the foot.  XR Foot 2 Views Right  Result Date: 03/27/2020 First MTP narrowing and valgus deformity was noted.  PIP and DIP narrowing was noted.  None of the other MTPs were involved.  No intertarsal or tibiotalar joint space narrowing was noted.  A small calcaneal spur was noted.  No erosive changes were noted. Impression: These findings are consistent with osteoarthritis of the foot.  XR Hand 2 View Left  Result Date: 03/27/2020 CMC, PIP and DIP narrowing was noted.  No MCP, intercarpal or radiocarpal joint space narrowing was noted.  No erosive changes were noted. Impression: These findings are consistent with osteoarthritis of the hand.  XR Hand 2 View Right  Result Date: 03/27/2020 CMC, PIP and DIP narrowing was noted.  No MCP, intercarpal or radiocarpal joint space narrowing was noted.  No erosive changes were noted. Impression: These findings are consistent with osteoarthritis of the hand.  XR KNEE 3 VIEW LEFT  Result Date: 03/27/2020 No significant medial lateral compartment narrowing was noted.  No patellofemoral narrowing was noted.  No chondrocalcinosis nodes were noted. Impression: Unremarkable x-ray of the knee joint.  XR KNEE 3 VIEW RIGHT  Result Date: 03/27/2020 Mild to moderate medial compartment narrowing was noted.  Intercondylar osteophytes were noted.  No patellofemoral narrowing was noted.  No chondrocalcinosis was noted. Impression: These findings are consistent with mild to moderate osteoarthritis of the knee joint.   Recent Labs: Lab Results  Component Value Date   WBC 5.1 05/04/2019   HGB 13.4 05/04/2019   PLT 222.0 05/04/2019   NA 142 01/03/2020   K 3.5 01/03/2020   CL 105 01/03/2020   CO2 30 01/03/2020   GLUCOSE 100 (H) 01/03/2020   BUN 11 01/03/2020   CREATININE 0.69 01/03/2020   BILITOT 0.8 05/04/2019   ALKPHOS 63 05/04/2019   AST 15 05/04/2019   ALT 13 05/04/2019   PROT  7.2 05/04/2019   ALBUMIN 4.5 05/04/2019   CALCIUM 9.7 01/03/2020   GFRAA >90 05/23/2013  May 10, 2021[facility] lupus index -1.8, ANA 1: 160NS, ENA negative, CB CAP negative, Jo 1 -, anticardiolipin IgM 24, beta-2 GP 1 -, antiphosphatidylserine negative, RF negative, anti-CCP negative, anti-carP negative.  Antihistone negative, antithyroglobulin negative, anti thyroid peroxidase negative  Speciality Comments: No specialty comments available.  Procedures:  No procedures performed Allergies: Patient has no known allergies.   Assessment / Plan:     Visit  Diagnoses: Primary osteoarthritis of both feet-clinical and radiographic findings are consistent with osteoarthritis.  Proper fitting shoes were discussed.  Primary osteoarthritis of both hands-she has bilateral PIP and DIP thickening and discomfort in her hands.  She states she has been using a Psychologist, occupational which has been very helpful.  I also went over some of the hand exercises with her.  A handout on exercises was given.  Natural anti-inflammatories were discussed.  Joint protection was discussed.  Primary osteoarthritis of right knee - Mild to moderate osteoarthritis.  She states she has discomfort in her bilateral knee joints.  A handout on knee joint exercises was given.  Positive ANA (antinuclear antibody) - ANA 1: 160 nucleolar speckled, anticardiolipin IgM 24.  History of sicca symptoms.  Over-the-counter products were discussed.  I will repeat anticardiolipin antibody in the future.  It is a low titer and not significant.  Alopecia-hair loss has improved.  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  Gravida 5 para 2 miscarriages 3  Lumbar radiculitis - X-rays done by her PCP.  Patient states she has intermittent lower back pain.  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  No orders of the defined types were placed in this encounter.   Face-to-face time spent with patient was 30 minutes. Greater than 50% of time was spent in counseling and coordination of care.  Follow-Up Instructions: Return in about 1 year (around 04/24/2021) for Osteoarthritis, sicca, +ANA.   Bo Merino, MD  Note - This record has been created using Editor, commissioning.  Chart creation errors have been sought, but may not always  have been located. Such creation errors do not reflect on  the standard of medical care.

## 2020-04-24 ENCOUNTER — Encounter: Payer: Self-pay | Admitting: Rheumatology

## 2020-04-24 ENCOUNTER — Other Ambulatory Visit: Payer: Self-pay

## 2020-04-24 ENCOUNTER — Ambulatory Visit: Payer: Federal, State, Local not specified - PPO | Admitting: Rheumatology

## 2020-04-24 VITALS — BP 115/72 | HR 69 | Resp 14 | Ht 65.0 in | Wt 141.8 lb

## 2020-04-24 DIAGNOSIS — K2990 Gastroduodenitis, unspecified, without bleeding: Secondary | ICD-10-CM

## 2020-04-24 DIAGNOSIS — L659 Nonscarring hair loss, unspecified: Secondary | ICD-10-CM

## 2020-04-24 DIAGNOSIS — M19072 Primary osteoarthritis, left ankle and foot: Secondary | ICD-10-CM

## 2020-04-24 DIAGNOSIS — E559 Vitamin D deficiency, unspecified: Secondary | ICD-10-CM

## 2020-04-24 DIAGNOSIS — M1711 Unilateral primary osteoarthritis, right knee: Secondary | ICD-10-CM

## 2020-04-24 DIAGNOSIS — B351 Tinea unguium: Secondary | ICD-10-CM

## 2020-04-24 DIAGNOSIS — R768 Other specified abnormal immunological findings in serum: Secondary | ICD-10-CM

## 2020-04-24 DIAGNOSIS — Z9289 Personal history of other medical treatment: Secondary | ICD-10-CM

## 2020-04-24 DIAGNOSIS — M19041 Primary osteoarthritis, right hand: Secondary | ICD-10-CM

## 2020-04-24 DIAGNOSIS — M19071 Primary osteoarthritis, right ankle and foot: Secondary | ICD-10-CM

## 2020-04-24 DIAGNOSIS — M19042 Primary osteoarthritis, left hand: Secondary | ICD-10-CM

## 2020-04-24 DIAGNOSIS — D500 Iron deficiency anemia secondary to blood loss (chronic): Secondary | ICD-10-CM

## 2020-04-24 DIAGNOSIS — N96 Recurrent pregnancy loss: Secondary | ICD-10-CM

## 2020-04-24 DIAGNOSIS — E785 Hyperlipidemia, unspecified: Secondary | ICD-10-CM

## 2020-04-24 DIAGNOSIS — M5416 Radiculopathy, lumbar region: Secondary | ICD-10-CM

## 2020-04-24 DIAGNOSIS — C187 Malignant neoplasm of sigmoid colon: Secondary | ICD-10-CM

## 2020-04-24 DIAGNOSIS — K2970 Gastritis, unspecified, without bleeding: Secondary | ICD-10-CM

## 2020-04-24 DIAGNOSIS — G620 Drug-induced polyneuropathy: Secondary | ICD-10-CM

## 2020-04-24 NOTE — Patient Instructions (Signed)
Journal for Nurse Practitioners, 15(4), 263-267. Retrieved August 28, 2018 from [email or web address].[email or web address]">  Knee Exercises Ask your health care provider which exercises are safe for you. Do exercises exactly as told by your health care provider and adjust them as directed. It is normal to feel mild stretching, pulling, tightness, or discomfort as you do these exercises. Stop right away if you feel sudden pain or your pain gets worse. Do not begin these exercises until told by your health care provider. Stretching and range-of-motion exercises These exercises warm up your muscles and joints and improve the movement and flexibility of your knee. These exercises also help to relieve pain and swelling. Knee extension, prone 1. Lie on your abdomen (prone position) on a bed. 2. Place your left / right knee just beyond the edge of the surface so your knee is not on the bed. You can put a towel under your left / right thigh just above your kneecap for comfort. 3. Relax your leg muscles and allow gravity to straighten your knee (extension). You should feel a stretch behind your left / right knee. 4. Hold this position for __________ seconds. 5. Scoot up so your knee is supported between repetitions. Repeat __________ times. Complete this exercise __________ times a day. Knee flexion, active  1. Lie on your back with both legs straight. If this causes back discomfort, bend your left / right knee so your foot is flat on the floor. 2. Slowly slide your left / right heel back toward your buttocks. Stop when you feel a gentle stretch in the front of your knee or thigh (flexion). 3. Hold this position for __________ seconds. 4. Slowly slide your left / right heel back to the starting position. Repeat __________ times. Complete this exercise __________ times a day. Quadriceps stretch, prone  1. Lie on your abdomen on a firm surface, such as a bed or padded floor. 2. Bend your left / right knee and hold  your ankle. If you cannot reach your ankle or pant leg, loop a belt around your foot and grab the belt instead. 3. Gently pull your heel toward your buttocks. Your knee should not slide out to the side. You should feel a stretch in the front of your thigh and knee (quadriceps). 4. Hold this position for __________ seconds. Repeat __________ times. Complete this exercise __________ times a day. Hamstring, supine 1. Lie on your back (supine position). 2. Loop a belt or towel over the ball of your left / right foot. The ball of your foot is on the walking surface, right under your toes. 3. Straighten your left / right knee and slowly pull on the belt to raise your leg until you feel a gentle stretch behind your knee (hamstring). ? Do not let your knee bend while you do this. ? Keep your other leg flat on the floor. 4. Hold this position for __________ seconds. Repeat __________ times. Complete this exercise __________ times a day. Strengthening exercises These exercises build strength and endurance in your knee. Endurance is the ability to use your muscles for a long time, even after they get tired. Quadriceps, isometric This exercise stretches the muscles in front of your thigh (quadriceps) without moving your knee joint (isometric). 1. Lie on your back with your left / right leg extended and your other knee bent. Put a rolled towel or small pillow under your knee if told by your health care provider. 2. Slowly tense the muscles in the front of your left /   right thigh. You should see your kneecap slide up toward your hip or see increased dimpling just above the knee. This motion will push the back of the knee toward the floor. 3. For __________ seconds, hold the muscle as tight as you can without increasing your pain. 4. Relax the muscles slowly and completely. Repeat __________ times. Complete this exercise __________ times a day. Straight leg raises This exercise stretches the muscles in front  of your thigh (quadriceps) and the muscles that move your hips (hip flexors). 1. Lie on your back with your left / right leg extended and your other knee bent. 2. Tense the muscles in the front of your left / right thigh. You should see your kneecap slide up or see increased dimpling just above the knee. Your thigh may even shake a bit. 3. Keep these muscles tight as you raise your leg 4-6 inches (10-15 cm) off the floor. Do not let your knee bend. 4. Hold this position for __________ seconds. 5. Keep these muscles tense as you lower your leg. 6. Relax your muscles slowly and completely after each repetition. Repeat __________ times. Complete this exercise __________ times a day. Hamstring, isometric 1. Lie on your back on a firm surface. 2. Bend your left / right knee about __________ degrees. 3. Dig your left / right heel into the surface as if you are trying to pull it toward your buttocks. Tighten the muscles in the back of your thighs (hamstring) to "dig" as hard as you can without increasing any pain. 4. Hold this position for __________ seconds. 5. Release the tension gradually and allow your muscles to relax completely for __________ seconds after each repetition. Repeat __________ times. Complete this exercise __________ times a day. Hamstring curls If told by your health care provider, do this exercise while wearing ankle weights. Begin with __________ lb weights. Then increase the weight by 1 lb (0.5 kg) increments. Do not wear ankle weights that are more than __________ lb. 1. Lie on your abdomen with your legs straight. 2. Bend your left / right knee as far as you can without feeling pain. Keep your hips flat against the floor. 3. Hold this position for __________ seconds. 4. Slowly lower your leg to the starting position. Repeat __________ times. Complete this exercise __________ times a day. Squats This exercise strengthens the muscles in front of your thigh and knee  (quadriceps). 1. Stand in front of a table, with your feet and knees pointing straight ahead. You may rest your hands on the table for balance but not for support. 2. Slowly bend your knees and lower your hips like you are going to sit in a chair. ? Keep your weight over your heels, not over your toes. ? Keep your lower legs upright so they are parallel with the table legs. ? Do not let your hips go lower than your knees. ? Do not bend lower than told by your health care provider. ? If your knee pain increases, do not bend as low. 3. Hold the squat position for __________ seconds. 4. Slowly push with your legs to return to standing. Do not use your hands to pull yourself to standing. Repeat __________ times. Complete this exercise __________ times a day. Wall slides This exercise strengthens the muscles in front of your thigh and knee (quadriceps). 1. Lean your back against a smooth wall or door, and walk your feet out 18-24 inches (46-61 cm) from it. 2. Place your feet hip-width apart. 3.   Slowly slide down the wall or door until your knees bend __________ degrees. Keep your knees over your heels, not over your toes. Keep your knees in line with your hips. 4. Hold this position for __________ seconds. Repeat __________ times. Complete this exercise __________ times a day. Straight leg raises This exercise strengthens the muscles that rotate the leg at the hip and move it away from your body (hip abductors). 1. Lie on your side with your left / right leg in the top position. Lie so your head, shoulder, knee, and hip line up. You may bend your bottom knee to help you keep your balance. 2. Roll your hips slightly forward so your hips are stacked directly over each other and your left / right knee is facing forward. 3. Leading with your heel, lift your top leg 4-6 inches (10-15 cm). You should feel the muscles in your outer hip lifting. ? Do not let your foot drift forward. ? Do not let your knee  roll toward the ceiling. 4. Hold this position for __________ seconds. 5. Slowly return your leg to the starting position. 6. Let your muscles relax completely after each repetition. Repeat __________ times. Complete this exercise __________ times a day. Straight leg raises This exercise stretches the muscles that move your hips away from the front of the pelvis (hip extensors). 1. Lie on your abdomen on a firm surface. You can put a pillow under your hips if that is more comfortable. 2. Tense the muscles in your buttocks and lift your left / right leg about 4-6 inches (10-15 cm). Keep your knee straight as you lift your leg. 3. Hold this position for __________ seconds. 4. Slowly lower your leg to the starting position. 5. Let your leg relax completely after each repetition. Repeat __________ times. Complete this exercise __________ times a day. This information is not intended to replace advice given to you by your health care provider. Make sure you discuss any questions you have with your health care provider. Document Revised: 08/29/2018 Document Reviewed: 08/29/2018 [employer] Patient Education  2020 [employer] Hand Exercises Hand exercises can be helpful for almost anyone. These exercises can strengthen the hands, improve flexibility and movement, and increase blood flow to the hands. These results can make work and daily tasks easier. Hand exercises can be especially helpful for people who have joint pain from arthritis or have nerve damage from overuse (carpal tunnel syndrome). These exercises can also help people who have injured a hand. Exercises Most of these hand exercises are gentle stretching and motion exercises. It is usually safe to do them often throughout the day. Warming up your hands before exercise may help to reduce stiffness. You can do this with gentle massage or by placing your hands in warm water for 10-15 minutes. It is normal to feel some stretching, pulling,  tightness, or mild discomfort as you begin new exercises. This will gradually improve. Stop an exercise right away if you feel sudden, severe pain or your pain gets worse. Ask your health care provider which exercises are best for you. Knuckle bend or "claw" fist 1. Stand or sit with your arm, hand, and all five fingers pointed straight up. Make sure to keep your wrist straight during the exercise. 2. Gently bend your fingers down toward your palm until the tips of your fingers are touching the top of your palm. Keep your big knuckle straight and just bend the small knuckles in your fingers. 3. Hold this position for   __________ seconds. 4. Straighten (extend) your fingers back to the starting position. Repeat this exercise 5-10 times with each hand. Full finger fist 1. Stand or sit with your arm, hand, and all five fingers pointed straight up. Make sure to keep your wrist straight during the exercise. 2. Gently bend your fingers into your palm until the tips of your fingers are touching the middle of your palm. 3. Hold this position for __________ seconds. 4. Extend your fingers back to the starting position, stretching every joint fully. Repeat this exercise 5-10 times with each hand. Straight fist 1. Stand or sit with your arm, hand, and all five fingers pointed straight up. Make sure to keep your wrist straight during the exercise. 2. Gently bend your fingers at the big knuckle, where your fingers meet your hand, and the middle knuckle. Keep the knuckle at the tips of your fingers straight and try to touch the bottom of your palm. 3. Hold this position for __________ seconds. 4. Extend your fingers back to the starting position, stretching every joint fully. Repeat this exercise 5-10 times with each hand. Tabletop 1. Stand or sit with your arm, hand, and all five fingers pointed straight up. Make sure to keep your wrist straight during the exercise. 2. Gently bend your fingers at the big  knuckle, where your fingers meet your hand, as far down as you can while keeping the small knuckles in your fingers straight. Think of forming a tabletop with your fingers. 3. Hold this position for __________ seconds. 4. Extend your fingers back to the starting position, stretching every joint fully. Repeat this exercise 5-10 times with each hand. Finger spread 1. Place your hand flat on a table with your palm facing down. Make sure your wrist stays straight as you do this exercise. 2. Spread your fingers and thumb apart from each other as far as you can until you feel a gentle stretch. Hold this position for __________ seconds. 3. Bring your fingers and thumb tight together again. Hold this position for __________ seconds. Repeat this exercise 5-10 times with each hand. Making circles 1. Stand or sit with your arm, hand, and all five fingers pointed straight up. Make sure to keep your wrist straight during the exercise. 2. Make a circle by touching the tip of your thumb to the tip of your index finger. 3. Hold for __________ seconds. Then open your hand wide. 4. Repeat this motion with your thumb and each finger on your hand. Repeat this exercise 5-10 times with each hand. Thumb motion 1. Sit with your forearm resting on a table and your wrist straight. Your thumb should be facing up toward the ceiling. Keep your fingers relaxed as you move your thumb. 2. Lift your thumb up as high as you can toward the ceiling. Hold for __________ seconds. 3. Bend your thumb across your palm as far as you can, reaching the tip of your thumb for the small finger (pinkie) side of your palm. Hold for __________ seconds. Repeat this exercise 5-10 times with each hand. Grip strengthening  1. Hold a stress ball or other soft ball in the middle of your hand. 2. Slowly increase the pressure, squeezing the ball as much as you can without causing pain. Think of bringing the tips of your fingers into the middle of  your palm. All of your finger joints should bend when doing this exercise. 3. Hold your squeeze for __________ seconds, then relax. Repeat this exercise 5-10 times with each   hand. Contact a health care provider if:  Your hand pain or discomfort gets much worse when you do an exercise.  Your hand pain or discomfort does not improve within 2 hours after you exercise. If you have any of these problems, stop doing these exercises right away. Do not do them again unless your health care provider says that you can. Get help right away if:  You develop sudden, severe hand pain or swelling. If this happens, stop doing these exercises right away. Do not do them again unless your health care provider says that you can. This information is not intended to replace advice given to you by your health care provider. Make sure you discuss any questions you have with your health care provider. Document Revised: 03/01/2019 Document Reviewed: 11/09/2018 Elsevier Patient Education  2020 [employer]  

## 2020-04-28 ENCOUNTER — Telehealth: Payer: Self-pay | Admitting: Rheumatology

## 2020-04-28 NOTE — Telephone Encounter (Signed)
Patient called requesting a return call regarding the labwork that Dr. Estanislado Pandy ordered from [place].  Patient states she received statement of benefits from [facility] showing patient "could be responsible for $1,200).  Patient states Dr. Estanislado Pandy told her she would only be responsible for $45.  Patient is requesting a return call.

## 2020-04-29 NOTE — Telephone Encounter (Signed)
Patient received an explanation of benefits explaining what Priscilla Hill could possibly charge her. Patient advised she should not receive a bill for more than $45. Patient advised if she receives a bill for more than that from [facility] to call the office.

## 2020-06-20 ENCOUNTER — Other Ambulatory Visit (INDEPENDENT_AMBULATORY_CARE_PROVIDER_SITE_OTHER): Payer: Federal, State, Local not specified - PPO

## 2020-06-20 ENCOUNTER — Encounter: Payer: Self-pay | Admitting: Gastroenterology

## 2020-06-20 ENCOUNTER — Ambulatory Visit: Payer: Federal, State, Local not specified - PPO | Admitting: Gastroenterology

## 2020-06-20 VITALS — BP 102/70 | HR 70 | Ht 65.0 in | Wt 139.0 lb

## 2020-06-20 DIAGNOSIS — Z85038 Personal history of other malignant neoplasm of large intestine: Secondary | ICD-10-CM

## 2020-06-20 DIAGNOSIS — K625 Hemorrhage of anus and rectum: Secondary | ICD-10-CM

## 2020-06-20 DIAGNOSIS — K5900 Constipation, unspecified: Secondary | ICD-10-CM

## 2020-06-20 LAB — CBC WITH DIFFERENTIAL/PLATELET
Basophils Absolute: 0 10*3/uL (ref 0.0–0.1)
Basophils Relative: 0.7 % (ref 0.0–3.0)
Eosinophils Absolute: 0.1 10*3/uL (ref 0.0–0.7)
Eosinophils Relative: 2 % (ref 0.0–5.0)
HCT: 39.5 % (ref 36.0–46.0)
Hemoglobin: 13.5 g/dL (ref 12.0–15.0)
Lymphocytes Relative: 27.2 % (ref 12.0–46.0)
Lymphs Abs: 1.4 10*3/uL (ref 0.7–4.0)
MCHC: 34.2 g/dL (ref 30.0–36.0)
MCV: 91 fl (ref 78.0–100.0)
Monocytes Absolute: 0.4 10*3/uL (ref 0.1–1.0)
Monocytes Relative: 7.4 % (ref 3.0–12.0)
Neutro Abs: 3.3 10*3/uL (ref 1.4–7.7)
Neutrophils Relative %: 62.7 % (ref 43.0–77.0)
Platelets: 230 10*3/uL (ref 150.0–400.0)
RBC: 4.34 Mil/uL (ref 3.87–5.11)
RDW: 12.9 % (ref 11.5–15.5)
WBC: 5.3 10*3/uL (ref 4.0–10.5)

## 2020-06-20 MED ORDER — FIBER CHOICE 1.5 G PO CHEW: CHEWABLE_TABLET | ORAL | Status: DC

## 2020-06-20 MED ORDER — SUTAB 1479-225-188 MG PO TABS: 1.0000 | ORAL_TABLET | Freq: Once | ORAL | 0 refills | Status: AC

## 2020-06-20 NOTE — Progress Notes (Signed)
HPI :  54 year old female with a history of colon cancer diagnosed in 2014, history of H. pylori, here to reestablish care for symptoms of rectal bleeding, she was last seen in our office on May 20, 2017.  She had sigmoid adenocarcinoma diagnosed in 2014, status post surgical resection and chemotherapy.  Her colonoscopy in 2015 was normal.  I performed her last colonoscopy in June 2018, she was noted to have 2 polyps removed, consistent with sessile serrated adenomas, as well as hypertrophied anal papilla but no other significant pathology.  Repeat exam in 5 years was recommended.  She states she has been having some intermittent bleeding symptoms for the past year or so.  She can have an occasional constipated stool when that happens she will see red blood in the stool and in the toilet paper.  Happens occasionally, not with every bowel movement.  She has some mild discomfort in her perianal area when this occurs.  She otherwise denies any abdominal pains.  Weight has been stable.  She states she likes to eat a lot of meat and sometimes I can constipate her, she has been trying to work on diet to manage her bowels.  On review of chart she has prescribed Norco to use as needed but she states she does not take that much at all.  No weight loss.  On review of chart in April 2019 she had a CT scan done for abdominal pain which did not show any concerning findings.  CEA level was undetectable in May of this year.   Prior endoscopic evaluationColonoscopy 03/2013 - sigmoid adenocarcinoma Colonoscopy 03/2014 - normal, no adenomas  EGD 01/09/16 - The esophagus was normal. DH, GEJ, and SCJ located 39cm from the incisors. The stomach was remarkable for some old heme and patchy erythema in the antrum and distal body. No focal ulcerations or erosions were noted. Biopsies were taken from the antrum and body to rule out H pylori. The duodenal bulb and 2nd portion of the duodenum were normal. Retroflexed views  revealed no abnormalities.  Colonoscopy 05/20/17 - The perianal and digital rectal examinations were normal. - The terminal ileum appeared normal. - A 8 mm polyp was found in the ascending colon. The polyp was flat. The polyp was removed with a cold snare. Resection and retrieval were complete. - A 5 mm polyp was found in the transverse colon. The polyp was flat. The polyp was removed with a cold snare. Resection and retrieval were complete. - There was evidence of a prior end-to-end colo-colonic anastomosis in the sigmoid colon. This was patent and was characterized by healthy appearing mucosa. - Anal papilla(e) were hypertrophied. - The exam was otherwise without abnormality.  Surgical [P], ascending, transverse, polyps (2) - SESSILE SERRATED POLYP (FIVE FRAGMENTS). - NO HIGH GRADE DYSPLASIA OR MALIGNANCY.    Past Medical History:  Diagnosis Date  . Anemia   . Anxiety   . Cancer ([place])   . GERD (gastroesophageal reflux disease)   . H. pylori infection    positive IgG serology  . History of multiple miscarriages   . History of positive PPD    never any symptoms, due to being from [place]? different "vaccine"  . Hyperlipidemia    mild     Past Surgical History:  Procedure Laterality Date  . childbirth   04-17-13   x 2 NVD  . ESSURE TUBAL LIGATION    . HYSTEROSCOPY WITH RESECTOSCOPE  04-17-13   "fibroids removed"  . LAPAROSCOPIC PARTIAL COLECTOMY  N/A 04/24/2013   Procedure: LAPAROSCOPIC ASSISTED SIGMOID COLECTOMY;  Surgeon: Odis Hollingshead, MD;  [facility];  Service: General;  Laterality: N/A;  . PORTACATH PLACEMENT Right 05/24/2013   Procedure: INSERTION PORT-A-CATH;  Surgeon: Odis Hollingshead, MD;  Location: [facility];  Service: General;  Laterality: Right;   Family History  Problem Relation Age of Onset  . Hypertension Mother   . Diabetes Mother   . Heart disease Mother   . Stroke Father 68  . Hypertension Father   . Heart disease Maternal Grandmother   . Heart  disease Maternal Grandfather   . Stomach cancer Other        grandmother  . Healthy Daughter   . Healthy Daughter   . Colon cancer Neg Hx    Social History   Tobacco Use  . Smoking status: Never Smoker  . Smokeless tobacco: Never Used  Vaping Use  . Vaping Use: Never used  Substance Use Topics  . Alcohol use: Yes    Alcohol/week: 0.0 standard drinks    Comment: occasionally  . Drug use: No   Current Outpatient Medications  Medication Sig Dispense Refill  . b complex vitamins tablet Take 1 tablet by mouth daily. 100 tablet 3  . Cholecalciferol (VITAMIN D3) 50 MCG (2000 UT) capsule Take 1 capsule (2,000 Units total) by mouth daily. 100 capsule 3  . Coenzyme Q10 (CO Q 10 PO) Take by mouth daily.    Marland Kitchen HYDROcodone-acetaminophen (NORCO/VICODIN) 5-325 MG tablet Take 0.5-1 tablets by mouth every 6 (six) hours as needed for severe pain. 20 tablet 0  . ibuprofen (ADVIL) 600 MG tablet Take twice a day x 2 weeks, then prn pain 60 tablet 1  . meloxicam (MOBIC) 15 MG tablet Take 1 tablet (15 mg total) by mouth daily. 90 tablet 1  . methylPREDNISolone (MEDROL DOSEPAK) 4 MG TBPK tablet As directed 21 tablet 0  . Omega-3 Fatty Acids (OMEGA 3 PO) Take by mouth daily.    Marland Kitchen omeprazole (PRILOSEC) 40 MG capsule Take 1 capsule (40 mg total) by mouth daily. 90 capsule 3  . tiZANidine (ZANAFLEX) 4 MG tablet Take 1 tablet (4 mg total) by mouth every 8 (eight) hours as needed for muscle spasms. 30 tablet 1  . TURMERIC PO Take by mouth daily.     No current facility-administered medications for this visit.   No Known Allergies   Review of Systems: All systems reviewed and negative except where noted in HPI.     Lab Results  Component Value Date   WBC 5.3 06/20/2020   HGB 13.5 06/20/2020   HCT 39.5 06/20/2020   MCV 91.0 06/20/2020   PLT 230.0 06/20/2020   CBC Latest Ref Rng & Units 06/20/2020 05/04/2019 02/13/2018  WBC 4.0 - 10.5 K/uL 5.3 5.1 6.1  Hemoglobin 12.0 - 15.0 g/dL 13.5 13.4 13.7    Hematocrit 36 - 46 % 39.5 39.6 40.2  Platelets 150 - 400 K/uL 230.0 222.0 210.0    Lab Results  Component Value Date   CREATININE 0.69 01/03/2020   BUN 11 01/03/2020   NA 142 01/03/2020   K 3.5 01/03/2020   CL 105 01/03/2020   CO2 30 01/03/2020    Lab Results  Component Value Date   ALT 13 05/04/2019   AST 15 05/04/2019   ALKPHOS 63 05/04/2019   BILITOT 0.8 05/04/2019     Physical Exam: BP 102/70   Pulse 70   Ht 5\' 5"  (1.651 m)   Wt 139  lb (63 kg)   BMI 23.13 kg/m  Constitutional: Pleasant,well-developed, female in no acute distress. HEENT: Normocephalic and atraumatic. Conjunctivae are normal. No scleral icterus. Neck supple.  Cardiovascular: Normal rate, regular rhythm.  Pulmonary/chest: Effort normal and breath sounds normal. No wheezing, rales or rhonchi. Abdominal: Soft, nondistended, nontender.  There are no masses palpable.  DRE / Anoscopy - no obvious fissure, small internal hemorrhoids, no mass lesions - Tia Alert CMA standby Extremities: no edema Lymphadenopathy: No cervical adenopathy noted. Neurological: Alert and oriented to person place and time. Skin: Skin is warm and dry. No rashes noted. Psychiatric: Normal mood and affect. Behavior is normal.   ASSESSMENT AND PLAN: 54 year old female here for reestablishment of care regarding the following:  Rectal bleeding / constipation / history of colon cancer - as above history of colon cancer status post resection and chemo, has had no evidence of significant polyp burden or disease on colonoscopy and imaging.  Last colonoscopy was 3 years ago.  Over the past year has had intermittent low-grade rectal bleeding in the setting of constipation, most suspicious for bleeding either hemorrhoidal bleeding versus fissure in etiology.  Anoscopy performed today, no obvious fissure seen, she does have some small internal hemorrhoids, but nothing with stigmata for bleeding or significantly inflamed.  I discussed options  with her.  While hemorrhoids could certainly be causing the symptoms in the setting of constipation, given her personal history of colon cancer, I offered her a colonoscopy to ensure no other pathology is driving the symptoms.  I discussed risk and benefits of colonoscopy and anesthesia and she wanted to proceed.  In the interim recommend using fiber supplementation daily such as Citrucel to soften stools see if that will help symptoms if related to hemorrhoids.  I checked a CBC today and there is no evidence of any anemia which is reassuring.  Further recommendations pending the results of her colonoscopy.  [place] Cellar, MD [facility]  CC: Plotnikov, Evie Lacks, MD

## 2020-06-20 NOTE — Patient Instructions (Signed)
If you are age 55 or older, your body mass index should be between 23-30. Your Body mass index is 23.13 kg/m. If this is out of the aforementioned range listed, please consider follow up with your Primary Care Provider.  If you are age 79 or younger, your body mass index should be between 19-25. Your Body mass index is 23.13 kg/m. If this is out of the aformentioned range listed, please consider follow up with your Primary Care Provider.   Please go to the lab in the basement of our building to have lab work done as you leave today. Hit "B" for basement when you get on the elevator.  When the doors open the lab is on your left.  We will call you with the results. Thank you.  Due to recent changes in healthcare laws, you may see the results of your imaging and laboratory studies on MyChart before your provider has had a chance to review them.  We understand that in some cases there may be results that are confusing or concerning to you. Not all laboratory results come back in the same time frame and the provider may be waiting for multiple results in order to interpret others.  Please give [place] 48 hours in order for your provider to thoroughly review all the results before contacting the office for clarification of your results.   You have been scheduled for a colonoscopy. Please follow written instructions given to you at your visit today.  Please pick up your prep supplies at the pharmacy within the next 1-3 days. If you use inhalers (even only as needed), please bring them with you on the day of your procedure.   We have given you samples of the following medication to take: Fiber Choice: Take daily as directed  Thank you for entrusting me with your care and for choosing [facility], Dr. [place] Cellar

## 2020-07-03 ENCOUNTER — Ambulatory Visit (INDEPENDENT_AMBULATORY_CARE_PROVIDER_SITE_OTHER): Payer: Federal, State, Local not specified - PPO | Admitting: Internal Medicine

## 2020-07-03 ENCOUNTER — Encounter: Payer: Self-pay | Admitting: Internal Medicine

## 2020-07-03 ENCOUNTER — Other Ambulatory Visit: Payer: Self-pay

## 2020-07-03 DIAGNOSIS — M1990 Unspecified osteoarthritis, unspecified site: Secondary | ICD-10-CM | POA: Insufficient documentation

## 2020-07-03 DIAGNOSIS — F419 Anxiety disorder, unspecified: Secondary | ICD-10-CM | POA: Diagnosis not present

## 2020-07-03 DIAGNOSIS — D6861 Antiphospholipid syndrome: Secondary | ICD-10-CM | POA: Diagnosis not present

## 2020-07-03 DIAGNOSIS — A048 Other specified bacterial intestinal infections: Secondary | ICD-10-CM

## 2020-07-03 DIAGNOSIS — E785 Hyperlipidemia, unspecified: Secondary | ICD-10-CM

## 2020-07-03 DIAGNOSIS — G4700 Insomnia, unspecified: Secondary | ICD-10-CM

## 2020-07-03 DIAGNOSIS — Z0000 Encounter for general adult medical examination without abnormal findings: Secondary | ICD-10-CM

## 2020-07-03 DIAGNOSIS — K2970 Gastritis, unspecified, without bleeding: Secondary | ICD-10-CM

## 2020-07-03 DIAGNOSIS — K2990 Gastroduodenitis, unspecified, without bleeding: Secondary | ICD-10-CM

## 2020-07-03 MED ORDER — ERYTHROMYCIN 5 MG/GM OP OINT
1.0000 | TOPICAL_OINTMENT | Freq: Three times a day (TID) | OPHTHALMIC | 0 refills | Status: DC
Start: 2020-07-03 — End: 2021-04-03

## 2020-07-03 MED ORDER — DICLOFENAC SODIUM 1 % EX GEL: 1.0000 "application " | Freq: Four times a day (QID) | CUTANEOUS | 3 refills | Status: DC

## 2020-07-03 MED ORDER — IBUPROFEN-DIPHENHYDRAMINE HCL 200-25 MG PO CAPS: ORAL_CAPSULE | ORAL | 3 refills | Status: DC

## 2020-07-03 MED ORDER — GLUCOSAMINE-CHONDROITIN 500-400 MG PO TABS: 1.0000 | ORAL_TABLET | Freq: Three times a day (TID) | ORAL | 3 refills | Status: DC

## 2020-07-03 NOTE — Assessment & Plan Note (Signed)
Cardiac CT scan for calcium offered

## 2020-07-03 NOTE — Assessment & Plan Note (Signed)
H pylori (+) in 2015, H pylori  (-) in 2/17 EGD Dr Zachery Conch Re-start Prilosec

## 2020-07-03 NOTE — Progress Notes (Signed)
Subjective:  Patient ID: Priscilla Hill, female    DOB: 1966-04-05  Age: 54 y.o. MRN: [id number]  CC: No chief complaint on file.   HPI Priscilla Hill presents for a well exam C/o arthritis x years - not better, stiff. Pain 5/10. Not taking meds On Turmeric C/o insomnia x years   Outpatient Medications Prior to Visit  Medication Sig Dispense Refill  . b complex vitamins tablet Take 1 tablet by mouth daily. 100 tablet 3  . Cholecalciferol (VITAMIN D3) 50 MCG (2000 UT) capsule Take 1 capsule (2,000 Units total) by mouth daily. 100 capsule 3  . Coenzyme Q10 (CO Q 10 PO) Take by mouth daily.    Marland Kitchen ibuprofen (ADVIL) 600 MG tablet Take twice a day x 2 weeks, then prn pain 60 tablet 1  . Inulin (FIBER CHOICE) 1.5 g CHEW Take daily as directed    . meloxicam (MOBIC) 15 MG tablet Take 1 tablet (15 mg total) by mouth daily. 90 tablet 1  . methylPREDNISolone (MEDROL DOSEPAK) 4 MG TBPK tablet As directed 21 tablet 0  . Omega-3 Fatty Acids (OMEGA 3 PO) Take by mouth daily.    Marland Kitchen omeprazole (PRILOSEC) 40 MG capsule Take 1 capsule (40 mg total) by mouth daily. 90 capsule 3  . tiZANidine (ZANAFLEX) 4 MG tablet Take 1 tablet (4 mg total) by mouth every 8 (eight) hours as needed for muscle spasms. 30 tablet 1  . TURMERIC PO Take by mouth daily.     No facility-administered medications prior to visit.    ROS: Review of Systems  Constitutional: Positive for fatigue. Negative for activity change, appetite change, chills and unexpected weight change.  HENT: Negative for congestion, mouth sores and sinus pressure.   Eyes: Negative for visual disturbance.  Respiratory: Negative for cough and chest tightness.   Gastrointestinal: Negative for abdominal pain and nausea.  Genitourinary: Negative for difficulty urinating, frequency and vaginal pain.  Musculoskeletal: Positive for arthralgias, back pain and gait problem. Negative for neck pain and neck stiffness.  Skin: Negative for pallor and rash.    Neurological: Negative for dizziness, tremors, weakness, numbness and headaches.  Psychiatric/Behavioral: Negative for confusion and sleep disturbance.    Objective:  BP 120/76 (BP Location: Right Arm, Patient Position: Sitting, Cuff Size: Normal)   Pulse 67   Temp 98 F (36.7 C) (Oral)   Ht 5\' 5"  (1.651 m)   Wt 139 lb (63 kg)   SpO2 97%   BMI 23.13 kg/m   BP Readings from Last 3 Encounters:  07/03/20 120/76  06/20/20 102/70  04/24/20 115/72    Wt Readings from Last 3 Encounters:  07/03/20 139 lb (63 kg)  06/20/20 139 lb (63 kg)  04/24/20 141 lb 12.8 oz (64.3 kg)    Physical Exam Constitutional:      General: She is not in acute distress.    Appearance: She is well-developed.  HENT:     Head: Normocephalic.     Right Ear: External ear normal.     Left Ear: External ear normal.     Nose: Nose normal.  Eyes:     General:        Right eye: No discharge.        Left eye: No discharge.     Conjunctiva/sclera: Conjunctivae normal.     Pupils: Pupils are equal, round, and reactive to light.  Neck:     Thyroid: No thyromegaly.     Vascular: No JVD.  Trachea: No tracheal deviation.  Cardiovascular:     Rate and Rhythm: Normal rate and regular rhythm.     Heart sounds: Normal heart sounds.  Pulmonary:     Effort: No respiratory distress.     Breath sounds: No stridor. No wheezing.  Abdominal:     General: Bowel sounds are normal. There is no distension.     Palpations: Abdomen is soft. There is no mass.     Tenderness: There is no abdominal tenderness. There is no guarding or rebound.  Musculoskeletal:        General: No tenderness.     Cervical back: Normal range of motion and neck supple.  Lymphadenopathy:     Cervical: No cervical adenopathy.  Skin:    Findings: No erythema or rash.  Neurological:     Mental Status: She is oriented to person, place, and time.     Cranial Nerves: No cranial nerve deficit.     Motor: No abnormal muscle tone.      Coordination: Coordination normal.     Deep Tendon Reflexes: Reflexes normal.  Psychiatric:        Behavior: Behavior normal.        Thought Content: Thought content normal.        Judgment: Judgment normal.     Lab Results  Component Value Date   WBC 5.3 06/20/2020   HGB 13.5 06/20/2020   HCT 39.5 06/20/2020   PLT 230.0 06/20/2020   GLUCOSE 100 (H) 01/03/2020   CHOL 225 (H) 05/04/2019   TRIG 49.0 05/04/2019   HDL 65.90 05/04/2019   LDLCALC 149 (H) 05/04/2019   ALT 13 05/04/2019   AST 15 05/04/2019   NA 142 01/03/2020   K 3.5 01/03/2020   CL 105 01/03/2020   CREATININE 0.69 01/03/2020   BUN 11 01/03/2020   CO2 30 01/03/2020   TSH 1.37 05/04/2019   INR 0.94 05/23/2013    CT Abdomen Pelvis W Contrast  Result Date: 02/22/2018 CLINICAL DATA:  Intermittent right lower quadrant pain for 1 year. 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 EXAM: CT ABDOMEN AND PELVIS WITH CONTRAST TECHNIQUE: Multidetector CT imaging of the abdomen and pelvis was performed using the standard protocol following bolus administration of intravenous contrast. CONTRAST:  128mL ISOVUE-300 IOPAMIDOL (ISOVUE-300) INJECTION 61% COMPARISON:  03/24/2016 FINDINGS: Lower chest: Minor basilar atelectasis. Normal heart size. No pericardial or pleural effusion. Hepatobiliary: No focal liver abnormality is seen. No gallstones, gallbladder wall thickening, or biliary dilatation. Pancreas: Unremarkable. No pancreatic ductal dilatation or surrounding inflammatory changes. Spleen: Normal in size without focal abnormality. Adrenals/Urinary Tract: Normal adrenal glands. Kidneys demonstrate no acute obstructive uropathy, hydronephrosis, focal abnormality. Ureters are symmetric and decompressed. Air noted within the bladder dome anteriorly, nonspecific, query recent instrumentation or catheterization. No bladder wall thickening. Stomach/Bowel: Negative for bowel obstruction, significant dilatation, ileus, or  free air. Normal appendix demonstrated. Previous partial colectomy with an anastomosis in the midline pelvis along the sigmoid colon. No significant bowel wall thickening or focal abnormality by CT. Vascular/Lymphatic: No significant vascular findings are present. No enlarged abdominal or pelvic lymph nodes. Reproductive: Tubal occlusion radiopaque devices noted bilaterally. Uterus and adnexa normal in size. Slight nodular enhancement of the uterus, suspect small fibroids. No pelvic free fluid or fluid collection. Other: No abdominal wall hernia or abnormality. No abdominopelvic ascites. Musculoskeletal: No acute or significant osseous findings. IMPRESSION: No acute abdominal or pelvic finding by CT. Stable postop changes from partial colectomy without evidence recurrence  or metastatic disease. Electronically Signed   By: Jerilynn Mages.  Shick M.D.   On: 02/22/2018 08:24    Assessment & Plan:   There are no diagnoses linked to this encounter.   No orders of the defined types were placed in this encounter.    Follow-up: No follow-ups on file.  Walker Kehr, MD

## 2020-07-03 NOTE — Assessment & Plan Note (Signed)
Discussed.

## 2020-07-03 NOTE — Patient Instructions (Signed)

## 2020-07-03 NOTE — Assessment & Plan Note (Addendum)
Bx was (-) for H pylori in 2017 (EGD)

## 2020-07-03 NOTE — Assessment & Plan Note (Signed)
We discussed age appropriate health related issues, including available/recomended screening tests and vaccinations. We discussed a need for adhering to healthy diet and exercise. Labs/EKG were reviewed/ordered. All questions were answered. See GYN Colon 07/2020 Declined all shots

## 2020-07-03 NOTE — Assessment & Plan Note (Signed)
Advil pm at hs

## 2020-07-03 NOTE — Assessment & Plan Note (Addendum)
ASA for travel  Baby ASA qd

## 2020-07-03 NOTE — Assessment & Plan Note (Signed)
Treat OA Advil pm at hs

## 2020-07-25 ENCOUNTER — Encounter: Payer: Self-pay | Admitting: Gastroenterology

## 2020-08-01 ENCOUNTER — Ambulatory Visit (AMBULATORY_SURGERY_CENTER): Payer: Federal, State, Local not specified - PPO | Admitting: Gastroenterology

## 2020-08-01 ENCOUNTER — Other Ambulatory Visit: Payer: Self-pay

## 2020-08-01 ENCOUNTER — Encounter: Payer: Self-pay | Admitting: Gastroenterology

## 2020-08-01 VITALS — BP 101/62 | HR 57 | Temp 97.1°F | Resp 14 | Ht 65.0 in | Wt 139.0 lb

## 2020-08-01 DIAGNOSIS — K625 Hemorrhage of anus and rectum: Secondary | ICD-10-CM

## 2020-08-01 DIAGNOSIS — K6289 Other specified diseases of anus and rectum: Secondary | ICD-10-CM

## 2020-08-01 DIAGNOSIS — Z85038 Personal history of other malignant neoplasm of large intestine: Secondary | ICD-10-CM

## 2020-08-01 DIAGNOSIS — K648 Other hemorrhoids: Secondary | ICD-10-CM

## 2020-08-01 MED ORDER — SODIUM CHLORIDE 0.9 % IV SOLN: 500.0000 mL | INTRAVENOUS | Status: DC

## 2020-08-01 NOTE — Progress Notes (Signed)
Vs CW  Pt's states no medical or surgical changes since previsit or office visit.  

## 2020-08-01 NOTE — Progress Notes (Signed)
pt tolerated well. VSS. awake and to recovery. Report given to RN.  

## 2020-08-01 NOTE — Patient Instructions (Signed)
Handouts on hemorrhoids, high fiber diet, and banding given to you today     YOU HAD AN ENDOSCOPIC PROCEDURE TODAY AT [facility]:   Refer to the procedure report that was given to you for any specific questions about what was found during the examination.  If the procedure report does not answer your questions, please call your gastroenterologist to clarify.  If you requested that your care partner not be given the details of your procedure findings, then the procedure report has been included in a sealed envelope for you to review at your convenience later.  YOU SHOULD EXPECT: Some feelings of bloating in the abdomen. Passage of more gas than usual.  Walking can help get rid of the air that was put into your GI tract during the procedure and reduce the bloating. If you had a lower endoscopy (such as a colonoscopy or flexible sigmoidoscopy) you may notice spotting of blood in your stool or on the toilet paper. If you underwent a bowel prep for your procedure, you may not have a normal bowel movement for a few days.  Please Note:  You might notice some irritation and congestion in your nose or some drainage.  This is from the oxygen used during your procedure.  There is no need for concern and it should clear up in a day or so.  SYMPTOMS TO REPORT IMMEDIATELY:   Following lower endoscopy (colonoscopy or flexible sigmoidoscopy):  Excessive amounts of blood in the stool  Significant tenderness or worsening of abdominal pains  Swelling of the abdomen that is new, acute  Fever of 100F or higher  For urgent or emergent issues, a gastroenterologist can be reached at any hour by calling [phone]. Do not use MyChart messaging for urgent concerns.    DIET:  We do recommend a small meal at first, but then you may proceed to your regular diet.  Drink plenty of fluids but you should avoid alcoholic beverages for 24 hours.  ACTIVITY:  You should plan to take it easy for the rest  of today and you should NOT DRIVE or use heavy machinery until tomorrow (because of the sedation medicines used during the test).    FOLLOW UP: Our staff will call the number listed on your records 48-72 hours following your procedure to check on you and address any questions or concerns that you may have regarding the information given to you following your procedure. If we do not reach you, we will leave a message.  We will attempt to reach you two times.  During this call, we will ask if you have developed any symptoms of COVID 19. If you develop any symptoms (ie: fever, flu-like symptoms, shortness of breath, cough etc.) before then, please call [phone].  If you test positive for Covid 19 in the 2 weeks post procedure, please call and report this information to [place].    SIGNATURES/CONFIDENTIALITY: You and/or your care partner have signed paperwork which will be entered into your electronic medical record.  These signatures attest to the fact that that the information above on your After Visit Summary has been reviewed and is understood.  Full responsibility of the confidentiality of this discharge information lies with you and/or your care-partner.

## 2020-08-01 NOTE — Op Note (Signed)
[place] Patient Name: Priscilla Hill Procedure Date: 08/01/2020 10:19 AM MRN: [id number] Endoscopist: Remo Lipps P. Havery Moros , MD Age: 54 Referring MD:  Date of Birth: Aug 04, 1966 Gender: Female Account #: [id number] Procedure:                Colonoscopy Indications:              Rectal bleeding, history of colon cancer treated in                            2014 Medicines:                Monitored Anesthesia Care Procedure:                Pre-Anesthesia Assessment:                           - Prior to the procedure, a History and Physical                            was performed, and patient medications and                            allergies were reviewed. The patient's tolerance of                            previous anesthesia was also reviewed. The risks                            and benefits of the procedure and the sedation                            options and risks were discussed with the patient.                            All questions were answered, and informed consent                            was obtained. Prior Anticoagulants: The patient has                            taken no previous anticoagulant or antiplatelet                            agents. ASA Grade Assessment: II - A patient with                            mild systemic disease. After reviewing the risks                            and benefits, the patient was deemed in                            satisfactory condition to undergo the procedure.  After obtaining informed consent, the colonoscope                            was passed under direct vision. Throughout the                            procedure, the patient's blood pressure, pulse, and                            oxygen saturations were monitored continuously. The                            Colonoscope was introduced through the anus and                            advanced to the the terminal ileum, with                             identification of the appendiceal orifice and IC                            valve. The colonoscopy was performed without                            difficulty. The patient tolerated the procedure                            well. The quality of the bowel preparation was                            good. The terminal ileum, ileocecal valve,                            appendiceal orifice, and rectum were photographed. Scope In: 10:29:14 AM Scope Out: 10:47:59 AM Scope Withdrawal Time: 0 hours 14 minutes 46 seconds  Total Procedure Duration: 0 hours 18 minutes 45 seconds  Findings:                 The perianal and digital rectal examinations were                            normal.                           The terminal ileum appeared normal.                           There was evidence of a prior end-to-end                            colo-colonic anastomosis in the sigmoid colon. This                            was patent and was characterized by healthy  appearing mucosa.                           Anal papilla(e) were hypertrophied.                           Internal hemorrhoids were found during retroflexion.                           The exam was otherwise without abnormality. No                            polyps. Complications:            No immediate complications. Estimated blood loss:                            None. Estimated Blood Loss:     Estimated blood loss: none. Impression:               - The examined portion of the ileum was normal.                           - Patent end-to-end colo-colonic anastomosis,                            characterized by healthy appearing mucosa.                           - Anal papilla(e) were hypertrophied.                           - Internal hemorrhoids.                           - The examination was otherwise normal.                           - No polyps.                           Bleeding is very  likely due to hemorrhoids, no                            other concerning pathology noted on this exam. Recommendation:           - Patient has a contact number available for                            emergencies. The signs and symptoms of potential                            delayed complications were discussed with the                            patient. Return to normal activities tomorrow.  Written discharge instructions were provided to the                            patient.                           - Resume previous diet.                           - Continue present medications.                           - Daily fiber supplement to keep stools soft,                            prevent straining                           - Consideration for hemorrhoid banding if bleeding                            symptoms persist                           - Repeat colonoscopy in 5 years for surveillance                            given history of colon cancer. Remo Lipps P. Havery Moros, MD 08/01/2020 10:54:28 AM This report has been signed electronically.

## 2020-08-05 ENCOUNTER — Telehealth: Payer: Self-pay | Admitting: *Deleted

## 2020-08-05 NOTE — Telephone Encounter (Signed)
  Follow up Call-  Call back number 08/01/2020  Post procedure Call Back phone  # [phone]  Permission to leave phone message Yes  Some recent data might be hidden     Patient questions:  Do you have a fever, pain , or abdominal swelling? No. Pain Score  0 *  Have you tolerated food without any problems? Yes.    Have you been able to return to your normal activities? Yes.    Do you have any questions about your discharge instructions: Diet   No. Medications  No. Follow up visit  No.  Do you have questions or concerns about your Care? No.  Actions: * If pain score is 4 or above: 1. No action needed, pain <4.Have you developed a fever since your procedure? no  2.   Have you had an respiratory symptoms (SOB or cough) since your procedure? no  3.   Have you tested positive for COVID 19 since your procedure no  4.   Have you had any family members/close contacts diagnosed with the COVID 19 since your procedure?  no   If yes to any of these questions please route to Joylene John, RN and Joella Prince, RN

## 2020-12-12 ENCOUNTER — Telehealth: Payer: Self-pay | Admitting: Internal Medicine

## 2020-12-12 NOTE — Telephone Encounter (Signed)
Patient started having covid symptoms today and her work asked her to call her PCP for advice on what she should do.

## 2020-12-15 NOTE — Telephone Encounter (Signed)
Pt needs to be covid tested.  Can offer options if not needing medication for symptoms.

## 2020-12-16 NOTE — Telephone Encounter (Signed)
I sent the patient a MyChart note  - "for a mild COVID-19 case you can take zinc 50 mg a day for 1 week, vitamin C 1000 mg daily for 1 week, vitamin D2 50,000 units weekly for 2 months (unless you are taking vitamin D daily already), Quercetin 500 mg twice a day for 1 week (if you can get it quick enough).  Maintain good oral hydration and take Tylenol with high fever. " Thx

## 2020-12-16 NOTE — Telephone Encounter (Signed)
Patient called and said that her Covid 19 test came back positive. Please give the patient a call back at [phone].

## 2020-12-22 DIAGNOSIS — Z20822 Contact with and (suspected) exposure to covid-19: Secondary | ICD-10-CM | POA: Diagnosis not present

## 2021-02-16 ENCOUNTER — Telehealth: Payer: Self-pay | Admitting: Oncology

## 2021-02-16 NOTE — Telephone Encounter (Signed)
Calendar & letter has been mailed to the patient with updated address for appointments at [facility]  

## 2021-02-28 DIAGNOSIS — H04123 Dry eye syndrome of bilateral lacrimal glands: Secondary | ICD-10-CM | POA: Diagnosis not present

## 2021-02-28 DIAGNOSIS — H40033 Anatomical narrow angle, bilateral: Secondary | ICD-10-CM | POA: Diagnosis not present

## 2021-03-09 ENCOUNTER — Telehealth: Payer: Self-pay | Admitting: Oncology

## 2021-03-09 NOTE — Telephone Encounter (Signed)
Due to [employer] 5/12 appts rescheduled updated calendar was sent.

## 2021-04-02 ENCOUNTER — Other Ambulatory Visit: Payer: Federal, State, Local not specified - PPO

## 2021-04-02 ENCOUNTER — Ambulatory Visit: Payer: Federal, State, Local not specified - PPO | Admitting: Oncology

## 2021-04-03 ENCOUNTER — Inpatient Hospital Stay: Payer: Federal, State, Local not specified - PPO | Attending: Oncology

## 2021-04-03 ENCOUNTER — Other Ambulatory Visit: Payer: Self-pay

## 2021-04-03 ENCOUNTER — Encounter: Payer: Self-pay | Admitting: Oncology

## 2021-04-03 ENCOUNTER — Inpatient Hospital Stay: Payer: Federal, State, Local not specified - PPO | Admitting: Oncology

## 2021-04-03 VITALS — BP 114/80 | HR 71 | Temp 98.9°F | Resp 18 | Ht 65.0 in | Wt 138.2 lb

## 2021-04-03 DIAGNOSIS — C187 Malignant neoplasm of sigmoid colon: Secondary | ICD-10-CM

## 2021-04-03 DIAGNOSIS — Z85038 Personal history of other malignant neoplasm of large intestine: Secondary | ICD-10-CM | POA: Diagnosis not present

## 2021-04-03 DIAGNOSIS — Z9221 Personal history of antineoplastic chemotherapy: Secondary | ICD-10-CM | POA: Insufficient documentation

## 2021-04-03 DIAGNOSIS — Z9049 Acquired absence of other specified parts of digestive tract: Secondary | ICD-10-CM | POA: Insufficient documentation

## 2021-04-03 LAB — CEA (ACCESS): CEA (CHCC): <1 ng/mL (ref 0.00–5.00)

## 2021-04-03 NOTE — Progress Notes (Signed)
[name]   Diagnosis: Colon cancer  INTERVAL HISTORY:   [name] returns as scheduled.  No difficulty with bowel function.  No bleeding.  She was diagnosed with COVID-19 in January.  She had received the COVID-19 vaccines.  She underwent colonoscopy 08/01/2020.  There were internal hemorrhoids.  No polyps. She has mild remaining numbness in the feet.  This does not interfere with activity. Objective:  Vital signs in last 24 hours:  Blood pressure 114/80, pulse 71, temperature 98.9 F (37.2 C), temperature source Oral, resp. rate 18, height _0  (1.651 m), weight 138 lb 3.2 oz (62.7 kg), SpO2 100 %.    Lymphatics: No cervical, supraclavicular, axillary, or inguinal nodes Resp: Lungs clear bilaterally Cardio: Regular rate and rhythm GI: No hepatosplenomegaly, no mass, nontender Vascular: No leg edema   Lab Results:  Lab Results  Component Value Date   WBC 5.3 06/20/2020   HGB 13.5 06/20/2020   HCT 39.5 06/20/2020   MCV 91.0 06/20/2020   PLT 230.0 06/20/2020   NEUTROABS 3.3 06/20/2020    CMP  Lab Results  Component Value Date   NA 142 01/03/2020   K 3.5 01/03/2020   CL 105 01/03/2020   CO2 30 01/03/2020   GLUCOSE 100 (H) 01/03/2020   BUN 11 01/03/2020   CREATININE 0.69 01/03/2020   CALCIUM 9.7 01/03/2020   PROT 7.2 05/04/2019   ALBUMIN 4.5 05/04/2019   AST 15 05/04/2019   ALT 13 05/04/2019   ALKPHOS 63 05/04/2019   BILITOT 0.8 05/04/2019   GFRNONAA >90 05/23/2013   GFRAA >90 05/23/2013    Lab Results  Component Value Date   CEA1 <1.00 04/02/2020    Medications: I have reviewed the patient's current medications.   Assessment/Plan: 1. 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 The tumor returned microsatellite stable with no loss of mismatch repair protein expression.  Cycle 1 adjuvant FOLFOX chemotherapy 05/28/2013.   Oxaliplatin  was held beginning with cycle 8 due to neuropathy symptoms.   Final cycle of 5-fluorouracil/leucovorin given on 10/29/2013.  03/04/2014 CEA less than 0.5.  04/02/2014 CT chest/abdomen/pelvis negative for metastatic disease.  Colonoscopy 04/17/2014-negative  04/07/2015 CT chest/abdomen/pelvis negative for metastatic disease  CTs of the chest, abdomen, and pelvis 03/24/2016-negative for recurrent colon cancer  Surveillance colonoscopy 05/20/2017- 8 mm polyp in the ascending colon, 5 mm polyp in the transverse colon (Surgical [P], ascending, transverse, polyps (2) SESSILE SERRATED POLYP (FIVE FRAGMENTS). NO HIGH GRADE DYSPLASIA OR MALIGNANCY.  CT abdomen/pelvis 02/21/2018 to evaluate intermittent right lower quadrant pain- no evidence of recurrent or metastatic disease.  Colonoscopy 08/01/2020- no polyps 2. History of microcytic anemia.  3. Port-A-Cath placement 05/24/2013. 4. History of uterine fibroids. 5. Nausea following cycle 1 FOLFOX. Aloxi and prophylactic Decadron were added with cycle 2. She had nausea following cycle 2. Emend was added with cycle 3. Improved. No nausea following cycle 5 or 6. 6. Mild neutropenia secondary chemotherapy. She received Neulasta beginning with cycle 4 FOLFOX  7. Abdominal discomfort following cycle 4 FOLFOX. Question gastritis related to steroids. She began Prilosec 20 mg daily. 8. History of mild thrombocytopenia secondary chemotherapy. 9. Pruritus and erythema over the palms beginning day 4 following cycle 5 FOLFOX and lasting approximately 3 days. Question hand-foot syndrome related to 5-fluorouracil, question allergic reaction. She was instructed to contact the office if she experiences this again. 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 Persistent numbness in  the feet  11. Soft mobile left axillary lymph node on exam 10/07/2014. Stable on exam 04/09/2015. Not noted on exam 10/06/2015 or 03/29/2016.  Present on exam  04/03/2018, 04/02/2020.    Disposition: Priscilla Hill is in clinical remission from colon cancer.  She has a good prognosis for a long-term disease-free survival.  She will continue colonoscopy surveillance with Dr. Havery Moros.  She would like to continue follow-up with the Cancer center.  We will follow up on the CEA from today.  She will return for an office visit and CEA in 1 year.  Priscilla Coder, MD  04/03/2021  11:06 AM

## 2021-04-06 LAB — CEA (IN HOUSE-CHCC): CEA (CHCC-In House): <1 ng/mL (ref 0.00–5.00)

## 2021-04-09 NOTE — Progress Notes (Signed)
Office Visit Note  Patient: Priscilla Hill             Date of Birth: [birth year]           MRN: [id number]             PCP: Cassandria Anger, MD Referring: Cassandria Anger, MD Visit Date: 04/23/2021 Occupation: @GUAROCC @  Subjective:  Dry eyes and pain in multiple joints.   History of Present Illness: Priscilla Hill is a 55 y.o. female with a history of osteoarthritis and positive ANA.  She states she continues to have pain and discomfort in her bilateral hands, her knees and her feet.  She has not seen any joint swelling.  She is switched jobs and she is working as a Gaffer now.  Which has been much easier on her joints.  She continues to have dry eyes.  She has been using eyedrops.  She denies any history of dry mouth.  There is no history of oral ulcers, nasal ulcers, malar rash, photosensitivity or Raynaud's phenomenon.  Activities of Daily Living:  Patient reports morning stiffness for 10-15  minutes.   Patient Denies nocturnal pain.  Difficulty dressing/grooming: Denies Difficulty climbing stairs: Reports Difficulty getting out of chair: Reports Difficulty using hands for taps, buttons, cutlery, and/or writing: Denies  Review of Systems  Constitutional: Negative for fatigue.  HENT: Negative for mouth sores, mouth dryness and nose dryness.   Eyes: Positive for itching and dryness. Negative for pain.  Respiratory: Negative for shortness of breath and difficulty breathing.   Cardiovascular: Negative for chest pain and palpitations.  Gastrointestinal: Negative for blood in stool, constipation and diarrhea.  Endocrine: Negative for increased urination.  Genitourinary: Negative for difficulty urinating.  Musculoskeletal: Positive for arthralgias, joint pain, joint swelling and morning stiffness. Negative for myalgias, muscle tenderness and myalgias.  Skin: Negative for color change, rash and redness.  Allergic/Immunologic: Negative for susceptible to infections.   Neurological: Positive for numbness. Negative for dizziness, headaches and weakness.  Hematological: Negative for bruising/bleeding tendency.  Psychiatric/Behavioral: Negative for confusion.    PMFS History:  Patient Active Problem List   Diagnosis Date Noted  . Osteoarthritis 07/03/2020  . Insomnia 07/03/2020  . Hand pain 01/03/2020  . Lumbar radiculitis 06/28/2019  . History of malignant neoplasm of colon 08/07/2018  . RLQ abdominal pain 02/13/2018  . Otitis externa 01/20/2018  . Onychomycosis 01/20/2018  . Subluxation of extensor carpi ulnaris tendon, right, initial encounter 03/02/2017  . Ulnar neuropathy at wrist, right 10/13/2016  . Wrist pain, acute, right 10/13/2016  . Dizziness 02/28/2016  . Impacted cerumen of right ear 01/14/2016  . Acute sinus infection 11/12/2015  . Gastritis and gastroduodenitis 10/13/2015  . Alopecia 02/26/2015  . Hot flashes 02/26/2015  . Low back pain 11/25/2014  . Vitamin D deficiency 10/27/2014  . H. pylori infection 10/27/2014  . Nausea without vomiting 10/16/2014  . Peripheral neuropathy, toxic 01/16/2014  . 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 Anemia due to chronic blood loss 04/25/2013  . Colon cancer-sigmoid 04/05/2013  . 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 Well adult exam 11/14/2012  . Hematochezia 11/14/2012  . Paresthesia 11/14/2012  . Dyslipidemia 11/14/2012  . URI, acute 05/31/2011  . Vaginitis 05/31/2011  . Anxiety 04/17/2010  . SHOULDER PAIN 04/17/2010  . THROAT PAIN, CHRONIC 03/14/2009  . DYSPHAGIA UNSPECIFIED 03/14/2009  . CONJUNCTIVITIS 12/05/2008  . ALLERGIC RHINITIS  10/08/2008  . Nonspecific (abnormal) findings on radiological and other examination of body structure 10/08/2008  . CHEST XRAY, ABNORMAL 10/08/2008  . Acute bronchitis 09/24/2008  . COUGH 09/24/2008    Past Medical History:  Diagnosis Date  . Anemia    . Anxiety   . Arthritis   . Cancer ([place])   . GERD (gastroesophageal reflux disease)   . H. pylori infection    positive IgG serology  . History of multiple miscarriages   . History of positive PPD    never any symptoms, due to being from [place]? different "vaccine"  . Hyperlipidemia    mild    Family History  Problem Relation Age of Onset  . Hypertension Mother   . Diabetes Mother   . Heart disease Mother   . Stroke Father 19  . Hypertension Father   . Heart disease Maternal Grandmother   . Heart disease Maternal Grandfather   . Stomach cancer Other        grandmother  . Healthy Daughter   . Healthy Daughter   . Colon cancer Neg Hx    Past Surgical History:  Procedure Laterality Date  . childbirth   04-17-13   x 2 NVD  . ESSURE TUBAL LIGATION    . HYSTEROSCOPY WITH RESECTOSCOPE  04-17-13   "fibroids removed"  . LAPAROSCOPIC PARTIAL COLECTOMY N/A 04/24/2013   Procedure: LAPAROSCOPIC ASSISTED SIGMOID COLECTOMY;  Surgeon: Odis Hollingshead, MD;  Location: [facility];  Service: General;  Laterality: N/A;  . PORTACATH PLACEMENT Right 05/24/2013   Procedure: INSERTION PORT-A-CATH;  Surgeon: Odis Hollingshead, MD;  Location: [facility];  Service: General;  Laterality: Right;   Social History   Social History Narrative   Married, husband Lennette Bihari   #1 small 1 yo in home and cares for her 83 year old mother   Both her and husband have #2 grown children each from prior marriage-her's are in [place] at [facility] in [place], [place] in [place]'s and able to drive   Speaks English well   Immunization History  Administered Date(s) Administered  . MMR 07/22/2005  . Td 07/22/2005  . Tdap 02/13/2018     Objective: Vital Signs: BP 123/77 (BP Location: Left Arm, Patient Position: Sitting, Cuff Size: Normal)   Pulse 71   Resp 13   Ht 5' 5.5" (1.664 m)   Wt 139 lb 12.8 oz (63.4 kg)   BMI 22.91 kg/m    Physical Exam Vitals and nursing note reviewed.   Constitutional:      Appearance: She is well-developed.  HENT:     Head: Normocephalic and atraumatic.  Eyes:     Conjunctiva/sclera: Conjunctivae normal.  Cardiovascular:     Rate and Rhythm: Normal rate and regular rhythm.     Heart sounds: Normal heart sounds.  Pulmonary:     Effort: Pulmonary effort is normal.     Breath sounds: Normal breath sounds.  Abdominal:     General: Bowel sounds are normal.     Palpations: Abdomen is soft.  Musculoskeletal:     Cervical back: Normal range of motion.  Lymphadenopathy:     Cervical: No cervical adenopathy.  Skin:    General: Skin is warm and dry.     Capillary Refill: Capillary refill takes less than 2 seconds.  Neurological:     Mental Status: She is alert and oriented to person, place, and time.  Psychiatric:  Behavior: Behavior normal.      Musculoskeletal Exam: C-spine, thoracic and lumbar spine with good range of motion.  Shoulder joints, elbow joints, wrist joints with good range of motion.  She had bilateral PIP and DIP thickening with no synovitis.  Hip joints and knee joints with good range of motion without any warmth swelling or effusion.  She had some tenderness across MTPs and PIPs but no synovitis was noted.  CDAI Exam: CDAI Score: -- Patient Global: --; Provider Global: -- Swollen: --; Tender: -- Joint Exam 04/23/2021   No joint exam has been documented for this visit   There is currently no information documented on the homunculus. Go to the Rheumatology activity and complete the homunculus joint exam.  Investigation: No additional findings.  Imaging: No results found.  Recent Labs: Lab Results  Component Value Date   WBC 5.3 06/20/2020   HGB 13.5 06/20/2020   PLT 230.0 06/20/2020   NA 142 01/03/2020   K 3.5 01/03/2020   CL 105 01/03/2020   CO2 30 01/03/2020   GLUCOSE 100 (H) 01/03/2020   BUN 11 01/03/2020   CREATININE 0.69 01/03/2020   BILITOT 0.8 05/04/2019   ALKPHOS 63 05/04/2019    AST 15 05/04/2019   ALT 13 05/04/2019   PROT 7.2 05/04/2019   ALBUMIN 4.5 05/04/2019   CALCIUM 9.7 01/03/2020   GFRAA >90 05/23/2013    Speciality Comments: No specialty comments available.  Procedures:  No procedures performed Allergies: Patient has no known allergies.   Assessment / Plan:     Visit Diagnoses: Primary osteoarthritis of both hands-she has bilateral PIP and DIP thickening.  Joint protection muscle strengthening was discussed.  No synovitis was noted.  A handout on hand exercises was given.  Changing jobs has been helpful for her.  Primary osteoarthritis of right knee - Mild to moderate osteoarthritis was noted on the x-rays initially.  Lower extremity muscle strengthening exercises were discussed and a handout was given.  Primary osteoarthritis of both feet - clinical and radiographic findings are consistent with osteoarthritis.  Proper fitting shoes were discussed.  Lumbar radiculitis - X-rays done by her PCP.  She states the pain has eased off.  Positive ANA (antinuclear antibody) - ANA 1: 160 nucleolar speckled, anticardiolipin IgM 24.  History of sicca symptoms.  She continues to have dry eye symptoms.  She has been using over-the-counter products.  I will obtain following labs today.- Plan: Urinalysis, Routine w reflex microscopic, ANA, Cardiolipin antibodies, IgG, IgM, IgA, C3 and C4, Anti-DNA antibody, double-stranded, Sjogrens syndrome-A extractable nuclear antibody.  I will call her once the results are available.  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  Gravida 5 para 2 miscarriages 3  Vitamin D deficiency-she has been advised to take vitamin D supplement.  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 CBC with Differential/Platelet  . COMPLETE METABOLIC PANEL WITH GFR  . Urinalysis, Routine w reflex microscopic  . ANA  . Cardiolipin antibodies, IgG, IgM, IgA  . C3 and C4  . Anti-DNA antibody, double-stranded  . Sjogrens syndrome-A extractable nuclear antibody  No orders of the defined types were placed in this encounter.    Follow-Up Instructions: Return in about 1 year (around 04/23/2022) for Osteoarthritis.   Bo Merino, MD  Note - This record has been created using Editor, commissioning.  Chart creation errors have been sought, but may not always  have been located. Such creation errors do not reflect on  the standard of medical care.

## 2021-04-23 ENCOUNTER — Other Ambulatory Visit: Payer: Self-pay

## 2021-04-23 ENCOUNTER — Encounter: Payer: Self-pay | Admitting: Rheumatology

## 2021-04-23 ENCOUNTER — Ambulatory Visit (INDEPENDENT_AMBULATORY_CARE_PROVIDER_SITE_OTHER): Payer: Federal, State, Local not specified - PPO | Admitting: Rheumatology

## 2021-04-23 VITALS — BP 123/77 | HR 71 | Resp 13 | Ht 65.5 in | Wt 139.8 lb

## 2021-04-23 DIAGNOSIS — N96 Recurrent pregnancy loss: Secondary | ICD-10-CM

## 2021-04-23 DIAGNOSIS — K2990 Gastroduodenitis, unspecified, without bleeding: Secondary | ICD-10-CM

## 2021-04-23 DIAGNOSIS — M5416 Radiculopathy, lumbar region: Secondary | ICD-10-CM | POA: Diagnosis not present

## 2021-04-23 DIAGNOSIS — M19042 Primary osteoarthritis, left hand: Secondary | ICD-10-CM

## 2021-04-23 DIAGNOSIS — M19071 Primary osteoarthritis, right ankle and foot: Secondary | ICD-10-CM

## 2021-04-23 DIAGNOSIS — R5383 Other fatigue: Secondary | ICD-10-CM | POA: Diagnosis not present

## 2021-04-23 DIAGNOSIS — R768 Other specified abnormal immunological findings in serum: Secondary | ICD-10-CM | POA: Diagnosis not present

## 2021-04-23 DIAGNOSIS — M19072 Primary osteoarthritis, left ankle and foot: Secondary | ICD-10-CM

## 2021-04-23 DIAGNOSIS — G620 Drug-induced polyneuropathy: Secondary | ICD-10-CM

## 2021-04-23 DIAGNOSIS — L659 Nonscarring hair loss, unspecified: Secondary | ICD-10-CM

## 2021-04-23 DIAGNOSIS — D500 Iron deficiency anemia secondary to blood loss (chronic): Secondary | ICD-10-CM

## 2021-04-23 DIAGNOSIS — M1711 Unilateral primary osteoarthritis, right knee: Secondary | ICD-10-CM | POA: Diagnosis not present

## 2021-04-23 DIAGNOSIS — E559 Vitamin D deficiency, unspecified: Secondary | ICD-10-CM

## 2021-04-23 DIAGNOSIS — B351 Tinea unguium: Secondary | ICD-10-CM

## 2021-04-23 DIAGNOSIS — M19041 Primary osteoarthritis, right hand: Secondary | ICD-10-CM

## 2021-04-23 DIAGNOSIS — C187 Malignant neoplasm of sigmoid colon: Secondary | ICD-10-CM

## 2021-04-23 DIAGNOSIS — Z9289 Personal history of other medical treatment: Secondary | ICD-10-CM

## 2021-04-23 DIAGNOSIS — E785 Hyperlipidemia, unspecified: Secondary | ICD-10-CM

## 2021-04-23 DIAGNOSIS — K2970 Gastritis, unspecified, without bleeding: Secondary | ICD-10-CM

## 2021-04-23 NOTE — Patient Instructions (Signed)
Journal for Nurse Practitioners, 15(4), 263-267. Retrieved August 28, 2018 from [email or web address]">  Knee Exercises Ask your health care provider which exercises are safe for you. Do exercises exactly as told by your health care provider and adjust them as directed. It is normal to feel mild stretching, pulling, tightness, or discomfort as you do these exercises. Stop right away if you feel sudden pain or your pain gets worse. Do not begin these exercises until told by your health care provider. Stretching and range-of-motion exercises These exercises warm up your muscles and joints and improve the movement and flexibility of your knee. These exercises also help to relieve pain and swelling. Knee extension, prone 1. Lie on your abdomen (prone position) on a bed. 2. Place your left / right knee just beyond the edge of the surface so your knee is not on the bed. You can put a towel under your left / right thigh just above your kneecap for comfort. 3. Relax your leg muscles and allow gravity to straighten your knee (extension). You should feel a stretch behind your left / right knee. 4. Hold this position for __________ seconds. 5. Scoot up so your knee is supported between repetitions. Repeat __________ times. Complete this exercise __________ times a day. Knee flexion, active 1. Lie on your back with both legs straight. If this causes back discomfort, bend your left / right knee so your foot is flat on the floor. 2. Slowly slide your left / right heel back toward your buttocks. Stop when you feel a gentle stretch in the front of your knee or thigh (flexion). 3. Hold this position for __________ seconds. 4. Slowly slide your left / right heel back to the starting position. Repeat __________ times. Complete this exercise __________ times a day.   Quadriceps stretch, prone 1. Lie on your abdomen on a firm surface, such as a bed or padded floor. 2. Bend your left / right knee and hold  your ankle. If you cannot reach your ankle or pant leg, loop a belt around your foot and grab the belt instead. 3. Gently pull your heel toward your buttocks. Your knee should not slide out to the side. You should feel a stretch in the front of your thigh and knee (quadriceps). 4. Hold this position for __________ seconds. Repeat __________ times. Complete this exercise __________ times a day.   Hamstring, supine 1. Lie on your back (supine position). 2. Loop a belt or towel over the ball of your left / right foot. The ball of your foot is on the walking surface, right under your toes. 3. Straighten your left / right knee and slowly pull on the belt to raise your leg until you feel a gentle stretch behind your knee (hamstring). ? Do not let your knee bend while you do this. ? Keep your other leg flat on the floor. 4. Hold this position for __________ seconds. Repeat __________ times. Complete this exercise __________ times a day. Strengthening exercises These exercises build strength and endurance in your knee. Endurance is the ability to use your muscles for a long time, even after they get tired. Quadriceps, isometric This exercise stretches the muscles in front of your thigh (quadriceps) without moving your knee joint (isometric). 1. Lie on your back with your left / right leg extended and your other knee bent. Put a rolled towel or small pillow under your knee if told by your health care provider. 2. Slowly tense the muscles in the front of your   left / right thigh. You should see your kneecap slide up toward your hip or see increased dimpling just above the knee. This motion will push the back of the knee toward the floor. 3. For __________ seconds, hold the muscle as tight as you can without increasing your pain. 4. Relax the muscles slowly and completely. Repeat __________ times. Complete this exercise __________ times a day.   Straight leg raises This exercise stretches the muscles in  front of your thigh (quadriceps) and the muscles that move your hips (hip flexors). 1. Lie on your back with your left / right leg extended and your other knee bent. 2. Tense the muscles in the front of your left / right thigh. You should see your kneecap slide up or see increased dimpling just above the knee. Your thigh may even shake a bit. 3. Keep these muscles tight as you raise your leg 4-6 inches (10-15 cm) off the floor. Do not let your knee bend. 4. Hold this position for __________ seconds. 5. Keep these muscles tense as you lower your leg. 6. Relax your muscles slowly and completely after each repetition. Repeat __________ times. Complete this exercise __________ times a day. Hamstring, isometric 1. Lie on your back on a firm surface. 2. Bend your left / right knee about __________ degrees. 3. Dig your left / right heel into the surface as if you are trying to pull it toward your buttocks. Tighten the muscles in the back of your thighs (hamstring) to "dig" as hard as you can without increasing any pain. 4. Hold this position for __________ seconds. 5. Release the tension gradually and allow your muscles to relax completely for __________ seconds after each repetition. Repeat __________ times. Complete this exercise __________ times a day. Hamstring curls If told by your health care provider, do this exercise while wearing ankle weights. Begin with __________ lb weights. Then increase the weight by 1 lb (0.5 kg) increments. Do not wear ankle weights that are more than __________ lb. 1. Lie on your abdomen with your legs straight. 2. Bend your left / right knee as far as you can without feeling pain. Keep your hips flat against the floor. 3. Hold this position for __________ seconds. 4. Slowly lower your leg to the starting position. Repeat __________ times. Complete this exercise __________ times a day.   Squats This exercise strengthens the muscles in front of your thigh and knee  (quadriceps). 1. Stand in front of a table, with your feet and knees pointing straight ahead. You may rest your hands on the table for balance but not for support. 2. Slowly bend your knees and lower your hips like you are going to sit in a chair. ? Keep your weight over your heels, not over your toes. ? Keep your lower legs upright so they are parallel with the table legs. ? Do not let your hips go lower than your knees. ? Do not bend lower than told by your health care provider. ? If your knee pain increases, do not bend as low. 3. Hold the squat position for __________ seconds. 4. Slowly push with your legs to return to standing. Do not use your hands to pull yourself to standing. Repeat __________ times. Complete this exercise __________ times a day. Wall slides This exercise strengthens the muscles in front of your thigh and knee (quadriceps). 1. Lean your back against a smooth wall or door, and walk your feet out 18-24 inches (46-61 cm) from it. 2.   Place your feet hip-width apart. 3. Slowly slide down the wall or door until your knees bend __________ degrees. Keep your knees over your heels, not over your toes. Keep your knees in line with your hips. 4. Hold this position for __________ seconds. Repeat __________ times. Complete this exercise __________ times a day.   Straight leg raises This exercise strengthens the muscles that rotate the leg at the hip and move it away from your body (hip abductors). 1. Lie on your side with your left / right leg in the top position. Lie so your head, shoulder, knee, and hip line up. You may bend your bottom knee to help you keep your balance. 2. Roll your hips slightly forward so your hips are stacked directly over each other and your left / right knee is facing forward. 3. Leading with your heel, lift your top leg 4-6 inches (10-15 cm). You should feel the muscles in your outer hip lifting. ? Do not let your foot drift forward. ? Do not let your  knee roll toward the ceiling. 4. Hold this position for __________ seconds. 5. Slowly return your leg to the starting position. 6. Let your muscles relax completely after each repetition. Repeat __________ times. Complete this exercise __________ times a day.   Straight leg raises This exercise stretches the muscles that move your hips away from the front of the pelvis (hip extensors). 1. Lie on your abdomen on a firm surface. You can put a pillow under your hips if that is more comfortable. 2. Tense the muscles in your buttocks and lift your left / right leg about 4-6 inches (10-15 cm). Keep your knee straight as you lift your leg. 3. Hold this position for __________ seconds. 4. Slowly lower your leg to the starting position. 5. Let your leg relax completely after each repetition. Repeat __________ times. Complete this exercise __________ times a day. This information is not intended to replace advice given to you by your health care provider. Make sure you discuss any questions you have with your health care provider. Document Revised: 08/29/2018 Document Reviewed: 08/29/2018 [employer] Patient Education  2021 [employer] Hand Exercises Hand exercises can be helpful for almost anyone. These exercises can strengthen the hands, improve flexibility and movement, and increase blood flow to the hands. These results can make work and daily tasks easier. Hand exercises can be especially helpful for people who have joint pain from arthritis or have nerve damage from overuse (carpal tunnel syndrome). These exercises can also help people who have injured a hand. Exercises Most of these hand exercises are gentle stretching and motion exercises. It is usually safe to do them often throughout the day. Warming up your hands before exercise may help to reduce stiffness. You can do this with gentle massage or by placing your hands in warm water for 10-15 minutes. It is normal to feel some stretching,  pulling, tightness, or mild discomfort as you begin new exercises. This will gradually improve. Stop an exercise right away if you feel sudden, severe pain or your pain gets worse. Ask your health care provider which exercises are best for you. Knuckle bend or "claw" fist 1. Stand or sit with your arm, hand, and all five fingers pointed straight up. Make sure to keep your wrist straight during the exercise. 2. Gently bend your fingers down toward your palm until the tips of your fingers are touching the top of your palm. Keep your big knuckle straight and just bend the   small knuckles in your fingers. 3. Hold this position for __________ seconds. 4. Straighten (extend) your fingers back to the starting position. Repeat this exercise 5-10 times with each hand. Full finger fist 1. Stand or sit with your arm, hand, and all five fingers pointed straight up. Make sure to keep your wrist straight during the exercise. 2. Gently bend your fingers into your palm until the tips of your fingers are touching the middle of your palm. 3. Hold this position for __________ seconds. 4. Extend your fingers back to the starting position, stretching every joint fully. Repeat this exercise 5-10 times with each hand. Straight fist 1. Stand or sit with your arm, hand, and all five fingers pointed straight up. Make sure to keep your wrist straight during the exercise. 2. Gently bend your fingers at the big knuckle, where your fingers meet your hand, and the middle knuckle. Keep the knuckle at the tips of your fingers straight and try to touch the bottom of your palm. 3. Hold this position for __________ seconds. 4. Extend your fingers back to the starting position, stretching every joint fully. Repeat this exercise 5-10 times with each hand. Tabletop 1. Stand or sit with your arm, hand, and all five fingers pointed straight up. Make sure to keep your wrist straight during the exercise. 2. Gently bend your fingers at the  big knuckle, where your fingers meet your hand, as far down as you can while keeping the small knuckles in your fingers straight. Think of forming a tabletop with your fingers. 3. Hold this position for __________ seconds. 4. Extend your fingers back to the starting position, stretching every joint fully. Repeat this exercise 5-10 times with each hand. Finger spread 1. Place your hand flat on a table with your palm facing down. Make sure your wrist stays straight as you do this exercise. 2. Spread your fingers and thumb apart from each other as far as you can until you feel a gentle stretch. Hold this position for __________ seconds. 3. Bring your fingers and thumb tight together again. Hold this position for __________ seconds. Repeat this exercise 5-10 times with each hand. Making circles 1. Stand or sit with your arm, hand, and all five fingers pointed straight up. Make sure to keep your wrist straight during the exercise. 2. Make a circle by touching the tip of your thumb to the tip of your index finger. 3. Hold for __________ seconds. Then open your hand wide. 4. Repeat this motion with your thumb and each finger on your hand. Repeat this exercise 5-10 times with each hand. Thumb motion 1. Sit with your forearm resting on a table and your wrist straight. Your thumb should be facing up toward the ceiling. Keep your fingers relaxed as you move your thumb. 2. Lift your thumb up as high as you can toward the ceiling. Hold for __________ seconds. 3. Bend your thumb across your palm as far as you can, reaching the tip of your thumb for the small finger (pinkie) side of your palm. Hold for __________ seconds. Repeat this exercise 5-10 times with each hand. Grip strengthening 1. Hold a stress ball or other soft ball in the middle of your hand. 2. Slowly increase the pressure, squeezing the ball as much as you can without causing pain. Think of bringing the tips of your fingers into the middle of  your palm. All of your finger joints should bend when doing this exercise. 3. Hold your squeeze for __________ seconds,   then relax. Repeat this exercise 5-10 times with each hand.   Contact a health care provider if:  Your hand pain or discomfort gets much worse when you do an exercise.  Your hand pain or discomfort does not improve within 2 hours after you exercise. If you have any of these problems, stop doing these exercises right away. Do not do them again unless your health care provider says that you can. Get help right away if:  You develop sudden, severe hand pain or swelling. If this happens, stop doing these exercises right away. Do not do them again unless your health care provider says that you can. This information is not intended to replace advice given to you by your health care provider. Make sure you discuss any questions you have with your health care provider. Document Revised: 03/01/2019 Document Reviewed: 11/09/2018 Else[employer] Patient Education  2021 [employer]  

## 2021-04-25 LAB — CBC WITH DIFFERENTIAL/PLATELET
Absolute Monocytes: 329 cells/uL (ref 200–950)
Basophils Absolute: 41 cells/uL (ref 0–200)
Basophils Relative: 0.9 %
Eosinophils Absolute: 59 cells/uL (ref 15–500)
Eosinophils Relative: 1.3 %
HCT: 39.2 % (ref 35.0–45.0)
Hemoglobin: 13.2 g/dL (ref 11.7–15.5)
Lymphs Abs: 1274 cells/uL (ref 850–3900)
MCH: 30.4 pg (ref 27.0–33.0)
MCHC: 33.7 g/dL (ref 32.0–36.0)
MCV: 90.3 fL (ref 80.0–100.0)
MPV: 11.2 fL (ref 7.5–12.5)
Monocytes Relative: 7.3 %
Neutro Abs: 2799 cells/uL (ref 1500–7800)
Neutrophils Relative %: 62.2 %
Platelets: 234 10*3/uL (ref 140–400)
RBC: 4.34 10*6/uL (ref 3.80–5.10)
RDW: 11.8 % (ref 11.0–15.0)
Total Lymphocyte: 28.3 %
WBC: 4.5 10*3/uL (ref 3.8–10.8)

## 2021-04-25 LAB — SJOGRENS SYNDROME-A EXTRACTABLE NUCLEAR ANTIBODY: SSA (Ro) (ENA) Antibody, IgG: <1 AI

## 2021-04-25 LAB — COMPLETE METABOLIC PANEL WITH GFR
AG Ratio: 1.8 (calc) (ref 1.0–2.5)
ALT: 20 U/L (ref 6–29)
AST: 19 U/L (ref 10–35)
Albumin: 4.5 g/dL (ref 3.6–5.1)
Alkaline phosphatase (APISO): 64 U/L (ref 37–153)
BUN: 10 mg/dL (ref 7–25)
CO2: 28 mmol/L (ref 20–32)
Calcium: 9.8 mg/dL (ref 8.6–10.4)
Chloride: 106 mmol/L (ref 98–110)
Creat: 0.65 mg/dL (ref 0.50–1.05)
GFR, Est African American: 115 mL/min/{1.73_m2} (ref >=60)
GFR, Est Non African American: 99 mL/min/{1.73_m2} (ref >=60)
Globulin: 2.5 g/dL (calc) (ref 1.9–3.7)
Glucose, Bld: 89 mg/dL (ref 65–99)
Potassium: 4.4 mmol/L (ref 3.5–5.3)
Sodium: 144 mmol/L (ref 135–146)
Total Bilirubin: 0.6 mg/dL (ref 0.2–1.2)
Total Protein: 7 g/dL (ref 6.1–8.1)

## 2021-04-25 LAB — ANA: Anti Nuclear Antibody (ANA): POSITIVE — AB

## 2021-04-25 LAB — ANTI-NUCLEAR AB-TITER (ANA TITER): ANA Titer 1: 1:80 {titer} — ABNORMAL HIGH

## 2021-04-25 LAB — URINALYSIS, ROUTINE W REFLEX MICROSCOPIC
Bilirubin Urine: NEGATIVE
Glucose, UA: NEGATIVE
Hgb urine dipstick: NEGATIVE
Ketones, ur: NEGATIVE
Leukocytes,Ua: NEGATIVE
Nitrite: NEGATIVE
Protein, ur: NEGATIVE
Specific Gravity, Urine: 1.006 (ref 1.001–1.035)
pH: 7 (ref 5.0–8.0)

## 2021-04-25 LAB — CARDIOLIPIN ANTIBODIES, IGG, IGM, IGA
Anticardiolipin IgA: <2 APL-U/mL
Anticardiolipin IgG: <2 GPL-U/mL
Anticardiolipin IgM: 10.6 MPL-U/mL

## 2021-04-25 LAB — C3 AND C4
C3 Complement: 121 mg/dL (ref 83–193)
C4 Complement: 31 mg/dL (ref 15–57)

## 2021-04-25 LAB — ANTI-DNA ANTIBODY, DOUBLE-STRANDED: ds DNA Ab: 4 IU/mL

## 2021-04-27 NOTE — Progress Notes (Signed)
ANA is low titer positive which is not significant.  All other labs are within normal limits.  No change in treatment advised.

## 2021-07-07 ENCOUNTER — Ambulatory Visit (INDEPENDENT_AMBULATORY_CARE_PROVIDER_SITE_OTHER): Payer: Federal, State, Local not specified - PPO | Admitting: Internal Medicine

## 2021-07-07 ENCOUNTER — Other Ambulatory Visit: Payer: Self-pay

## 2021-07-07 ENCOUNTER — Encounter: Payer: Self-pay | Admitting: Internal Medicine

## 2021-07-07 VITALS — BP 120/72 | HR 72 | Temp 98.7°F | Ht 65.5 in | Wt 140.8 lb

## 2021-07-07 DIAGNOSIS — Z0000 Encounter for general adult medical examination without abnormal findings: Secondary | ICD-10-CM | POA: Diagnosis not present

## 2021-07-07 DIAGNOSIS — D6861 Antiphospholipid syndrome: Secondary | ICD-10-CM | POA: Diagnosis not present

## 2021-07-07 DIAGNOSIS — E785 Hyperlipidemia, unspecified: Secondary | ICD-10-CM

## 2021-07-07 DIAGNOSIS — M5416 Radiculopathy, lumbar region: Secondary | ICD-10-CM | POA: Diagnosis not present

## 2021-07-07 DIAGNOSIS — C187 Malignant neoplasm of sigmoid colon: Secondary | ICD-10-CM

## 2021-07-07 DIAGNOSIS — K2970 Gastritis, unspecified, without bleeding: Secondary | ICD-10-CM | POA: Diagnosis not present

## 2021-07-07 DIAGNOSIS — M79641 Pain in right hand: Secondary | ICD-10-CM

## 2021-07-07 DIAGNOSIS — M79642 Pain in left hand: Secondary | ICD-10-CM

## 2021-07-07 DIAGNOSIS — K2990 Gastroduodenitis, unspecified, without bleeding: Secondary | ICD-10-CM

## 2021-07-07 LAB — CBC WITH DIFFERENTIAL/PLATELET
Basophils Absolute: 0 10*3/uL (ref 0.0–0.1)
Basophils Relative: 0.7 % (ref 0.0–3.0)
Eosinophils Absolute: 0.1 10*3/uL (ref 0.0–0.7)
Eosinophils Relative: 1.8 % (ref 0.0–5.0)
HCT: 39 % (ref 36.0–46.0)
Hemoglobin: 13 g/dL (ref 12.0–15.0)
Lymphocytes Relative: 33 % (ref 12.0–46.0)
Lymphs Abs: 1.6 10*3/uL (ref 0.7–4.0)
MCHC: 33.3 g/dL (ref 30.0–36.0)
MCV: 90.8 fl (ref 78.0–100.0)
Monocytes Absolute: 0.4 10*3/uL (ref 0.1–1.0)
Monocytes Relative: 8.7 % (ref 3.0–12.0)
Neutro Abs: 2.7 10*3/uL (ref 1.4–7.7)
Neutrophils Relative %: 55.8 % (ref 43.0–77.0)
Platelets: 206 10*3/uL (ref 150.0–400.0)
RBC: 4.3 Mil/uL (ref 3.87–5.11)
RDW: 12.9 % (ref 11.5–15.5)
WBC: 4.8 10*3/uL (ref 4.0–10.5)

## 2021-07-07 LAB — URINALYSIS, ROUTINE W REFLEX MICROSCOPIC
Bilirubin Urine: NEGATIVE
Ketones, ur: NEGATIVE
Leukocytes,Ua: NEGATIVE
Nitrite: NEGATIVE
Specific Gravity, Urine: 1.01 (ref 1.000–1.030)
Total Protein, Urine: NEGATIVE
Urine Glucose: NEGATIVE
Urobilinogen, UA: 0.2 (ref 0.0–1.0)
WBC, UA: NONE SEEN
pH: 7 (ref 5.0–8.0)

## 2021-07-07 LAB — LIPID PANEL
Cholesterol: 243 mg/dL — ABNORMAL HIGH (ref 0–200)
HDL: 56.2 mg/dL (ref >39.00)
LDL Cholesterol: 169 mg/dL — ABNORMAL HIGH (ref 0–99)
NonHDL: 187.06
Total CHOL/HDL Ratio: 4
Triglycerides: 92 mg/dL (ref 0.0–149.0)
VLDL: 18.4 mg/dL (ref 0.0–40.0)

## 2021-07-07 LAB — COMPREHENSIVE METABOLIC PANEL
ALT: 16 U/L (ref 0–35)
AST: 18 U/L (ref 0–37)
Albumin: 4.1 g/dL (ref 3.5–5.2)
Alkaline Phosphatase: 53 U/L (ref 39–117)
BUN: 7 mg/dL (ref 6–23)
CO2: 29 mEq/L (ref 19–32)
Calcium: 9.5 mg/dL (ref 8.4–10.5)
Chloride: 106 mEq/L (ref 96–112)
Creatinine, Ser: 0.66 mg/dL (ref 0.40–1.20)
GFR: 98.05 mL/min (ref >60.00)
Glucose, Bld: 90 mg/dL (ref 70–99)
Potassium: 3.9 mEq/L (ref 3.5–5.1)
Sodium: 143 mEq/L (ref 135–145)
Total Bilirubin: 0.8 mg/dL (ref 0.2–1.2)
Total Protein: 7.1 g/dL (ref 6.0–8.3)

## 2021-07-07 LAB — TSH: TSH: 1.51 u[IU]/mL (ref 0.35–5.50)

## 2021-07-07 MED ORDER — IBUPROFEN 600 MG PO TABS: ORAL_TABLET | ORAL | 3 refills | Status: DC

## 2021-07-07 NOTE — Assessment & Plan Note (Signed)
A cardiac CT scan for calcium scoring offered 

## 2021-07-07 NOTE — Patient Instructions (Addendum)
[facility] recovery slides [facility]  Cardiac CT calcium scoring test $99 Tel # is [phone]   Computed tomography, more commonly known as a CT or CAT scan, is a diagnostic medical imaging test. Like traditional x-rays, it produces multiple images or pictures of the inside of the body. The cross-sectional images generated during a CT scan can be reformatted in multiple planes. They can even generate three-dimensional images. These images can be viewed on a computer monitor, printed on film or by a 3D printer, or transferred to a CD or DVD. CT images of internal organs, bones, soft tissue and blood vessels provide greater detail than traditional x-rays, particularly of soft tissues and blood vessels. A cardiac CT scan for coronary calcium is a non-invasive way of obtaining information about the presence, location and extent of calcified plaque in the coronary arteries--the vessels that supply oxygen-containing blood to the heart muscle. Calcified plaque results when there is a build-up of fat and other substances under the inner layer of the artery. This material can calcify which signals the presence of atherosclerosis, a disease of the vessel wall, also called coronary artery disease (CAD). People with this disease have an increased risk for heart attacks. In addition, over time, progression of plaque build up (CAD) can narrow the arteries or even close off blood flow to the heart. The result may be chest pain, sometimes called "angina," or a heart attack. Because calcium is a marker of CAD, the amount of calcium detected on a cardiac CT scan is a helpful prognostic tool. The findings on cardiac CT are expressed as a calcium score. Another name for this test is coronary artery calcium scoring.  What are some common uses of the procedure? The goal of cardiac CT scan for calcium scoring is to determine if CAD is present and to what extent, even if there are no symptoms. It is a screening study that may be  recommended by a physician for patients with risk factors for CAD but no clinical symptoms. The major risk factors for CAD are: high blood cholesterol levels  family history of heart attacks  diabetes  high blood pressure  cigarette smoking  overweight or obese  physical inactivity   A negative cardiac CT scan for calcium scoring shows no calcification within the coronary arteries. This suggests that CAD is absent or so minimal it cannot be seen by this technique. The chance of having a heart attack over the next two to five years is very low under these circumstances. A positive test means that CAD is present, regardless of whether or not the patient is experiencing any symptoms. The amount of calcification--expressed as the calcium score--may help to predict the likelihood of a myocardial infarction (heart attack) in the coming years and helps your medical doctor or cardiologist decide whether the patient may need to take preventive medicine or undertake other measures such as diet and exercise to lower the risk for heart attack. The extent of CAD is graded according to your calcium score:  Calcium Score  Presence of CAD (coronary artery disease)  0 No evidence of CAD   1-10 Minimal evidence of CAD  11-100 Mild evidence of CAD  101-400 Moderate evidence of CAD  Over 400 Extensive evidence of CAD   Coronary artery calcium (CAC) score is a strong predictor of incident coronary heart disease (CHD) and provides predictive information beyond traditional risk factors. CAC scoring is reasonable to use in the decision to withhold, postpone, or initiate statin therapy in  intermediate-risk or selected borderline-risk asymptomatic adults (age 30-75 years and LDL-C >=70 to <190 mg/dL) who do not have diabetes or established atherosclerotic cardiovascular disease (ASCVD).* In intermediate-risk (10-year ASCVD risk >=7.5% to <20%) adults or selected borderline-risk (10-year ASCVD risk >=5% to  <7.5%) adults in whom a CAC score is measured for the purpose of making a treatment decision the following recommendations have been made:   If CAC=0, it is reasonable to withhold statin therapy and reassess in 5 to 10 years, as long as higher risk conditions are absent (diabetes mellitus, family history of premature CHD in first degree relatives (males <55 years; females <65 years), cigarette smoking, or LDL >=190 mg/dL).   If CAC is 1 to 99, it is reasonable to initiate statin therapy for patients >=64 years of age.   If CAC is >=100 or >=75th percentile, it is reasonable to initiate statin therapy at any age.   Cardiology referral should be considered for patients with CAC scores >=400 or >=75th percentile.   *2018 AHA/ACC/AACVPR/AAPA/ABC/ACPM/ADA/AGS/APhA/ASPC/NLA/PCNA Guideline on the Management of Blood Cholesterol: A Report of the American College of Cardiology/American Heart Association Task Force on Clinical Practice Guidelines. J Am Coll Cardiol. 2019;73(24):3168-3209.

## 2021-07-07 NOTE — Assessment & Plan Note (Signed)
Next colon in 4 years

## 2021-07-07 NOTE — Assessment & Plan Note (Signed)
  We discussed age appropriate health related issues, including available/recomended screening tests and vaccinations. Labs were ordered to be later reviewed . All questions were answered. We discussed one or more of the following - seat belt use, use of sunscreen/sun exposure exercise, fall risk reduction, second hand smoke exposure, firearm use and storage, seat belt use, a need for adhering to healthy diet and exercise. Labs were ordered.  All questions were answered. A cardiac CT scan for calcium scoring offered  Declined all shots

## 2021-07-07 NOTE — Assessment & Plan Note (Signed)
Hand massager Better

## 2021-07-07 NOTE — Assessment & Plan Note (Signed)
Use ASA for travel

## 2021-07-07 NOTE — Assessment & Plan Note (Signed)
On Prilosec prn 

## 2021-07-07 NOTE — Assessment & Plan Note (Signed)
Better w/job change Ibuprofen prn pc

## 2021-07-07 NOTE — Progress Notes (Signed)
Subjective:  Patient ID: Priscilla Hill, female    DOB: 30-Sep-1966  Age: 55 y.o. MRN: [id number]  CC: Annual Exam   HPI Priscilla Hill presents for a well exam  Outpatient Medications Prior to Visit  Medication Sig Dispense Refill   b complex vitamins tablet Take 1 tablet by mouth daily. 100 tablet 3   Cholecalciferol (VITAMIN D3) 50 MCG (2000 UT) capsule Take 1 capsule (2,000 Units total) by mouth daily. 100 capsule 3   diclofenac Sodium (VOLTAREN) 1 % GEL Apply 1 application topically 4 (four) times daily. 100 g 3   glucosamine-chondroitin (MAX GLUCOSAMINE CHONDROITIN) 500-400 MG tablet Take 1 tablet by mouth 3 (three) times daily. 100 tablet 3   Inulin (FIBER CHOICE) 1.5 g CHEW Take daily as directed     Melatonin 10 MG TABS Take 10 mg by mouth at bedtime.     Omega-3 Fatty Acids (OMEGA 3 PO) Take by mouth daily.     omeprazole (PRILOSEC) 40 MG capsule Take 1 capsule (40 mg total) by mouth daily. (Patient taking differently: Take 40 mg by mouth as needed.) 90 capsule 3   TURMERIC PO Take by mouth daily.     tiZANidine (ZANAFLEX) 4 MG tablet Take 1 tablet (4 mg total) by mouth every 8 (eight) hours as needed for muscle spasms. (Patient not taking: Reported on 07/07/2021) 30 tablet 1   No facility-administered medications prior to visit.    ROS: Review of Systems  Constitutional:  Negative for activity change, appetite change, chills, fatigue and unexpected weight change.  HENT:  Negative for congestion, mouth sores and sinus pressure.   Eyes:  Negative for visual disturbance.  Respiratory:  Negative for cough and chest tightness.   Gastrointestinal:  Negative for abdominal pain and nausea.  Genitourinary:  Negative for difficulty urinating, frequency and vaginal pain.  Musculoskeletal:  Negative for back pain and gait problem.  Skin:  Negative for pallor and rash.  Neurological:  Negative for dizziness, tremors, weakness, numbness and headaches.  Psychiatric/Behavioral:  Negative  for confusion and sleep disturbance.    Objective:  BP 120/72 (BP Location: Left Arm)   Pulse 72   Temp 98.7 F (37.1 C) (Oral)   Ht 5' 5.5" (1.664 m)   Wt 140 lb 12.8 oz (63.9 kg)   SpO2 95%   BMI 23.07 kg/m   BP Readings from Last 3 Encounters:  07/07/21 120/72  04/23/21 123/77  04/03/21 114/80    Wt Readings from Last 3 Encounters:  07/07/21 140 lb 12.8 oz (63.9 kg)  04/23/21 139 lb 12.8 oz (63.4 kg)  04/03/21 138 lb 3.2 oz (62.7 kg)    Physical Exam Constitutional:      General: She is not in acute distress.    Appearance: She is well-developed.  HENT:     Head: Normocephalic.     Right Ear: External ear normal.     Left Ear: External ear normal.     Nose: Nose normal.  Eyes:     General:        Right eye: No discharge.        Left eye: No discharge.     Conjunctiva/sclera: Conjunctivae normal.     Pupils: Pupils are equal, round, and reactive to light.  Neck:     Thyroid: No thyromegaly.     Vascular: No JVD.     Trachea: No tracheal deviation.  Cardiovascular:     Rate and Rhythm: Normal rate and regular rhythm.  Heart sounds: Normal heart sounds.  Pulmonary:     Effort: No respiratory distress.     Breath sounds: No stridor. No wheezing.  Abdominal:     General: Bowel sounds are normal. There is no distension.     Palpations: Abdomen is soft. There is no mass.     Tenderness: There is no abdominal tenderness. There is no guarding or rebound.  Musculoskeletal:        General: No tenderness.     Cervical back: Normal range of motion and neck supple. No rigidity.  Lymphadenopathy:     Cervical: No cervical adenopathy.  Skin:    Findings: No erythema or rash.  Neurological:     Cranial Nerves: No cranial nerve deficit.     Motor: No abnormal muscle tone.     Coordination: Coordination normal.     Deep Tendon Reflexes: Reflexes normal.  Psychiatric:        Behavior: Behavior normal.        Thought Content: Thought content normal.         Judgment: Judgment normal.    Lab Results  Component Value Date   WBC 4.5 04/23/2021   HGB 13.2 04/23/2021   HCT 39.2 04/23/2021   PLT 234 04/23/2021   GLUCOSE 89 04/23/2021   CHOL 225 (H) 05/04/2019   TRIG 49.0 05/04/2019   HDL 65.90 05/04/2019   LDLCALC 149 (H) 05/04/2019   ALT 20 04/23/2021   AST 19 04/23/2021   NA 144 04/23/2021   K 4.4 04/23/2021   CL 106 04/23/2021   CREATININE 0.65 04/23/2021   BUN 10 04/23/2021   CO2 28 04/23/2021   TSH 1.37 05/04/2019   INR 0.94 05/23/2013    CT Abdomen Pelvis W Contrast  Result Date: 02/22/2018 CLINICAL DATA:  Intermittent right lower quadrant pain for 1 year. 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 EXAM: CT ABDOMEN AND PELVIS WITH CONTRAST TECHNIQUE: Multidetector CT imaging of the abdomen and pelvis was performed using the standard protocol following bolus administration of intravenous contrast. CONTRAST:  169m ISOVUE-300 IOPAMIDOL (ISOVUE-300) INJECTION 61% COMPARISON:  03/24/2016 FINDINGS: Lower chest: Minor basilar atelectasis. Normal heart size. No pericardial or pleural effusion. Hepatobiliary: No focal liver abnormality is seen. No gallstones, gallbladder wall thickening, or biliary dilatation. Pancreas: Unremarkable. No pancreatic ductal dilatation or surrounding inflammatory changes. Spleen: Normal in size without focal abnormality. Adrenals/Urinary Tract: Normal adrenal glands. Kidneys demonstrate no acute obstructive uropathy, hydronephrosis, focal abnormality. Ureters are symmetric and decompressed. Air noted within the bladder dome anteriorly, nonspecific, query recent instrumentation or catheterization. No bladder wall thickening. Stomach/Bowel: Negative for bowel obstruction, significant dilatation, ileus, or free air. Normal appendix demonstrated. Previous partial colectomy with an anastomosis in the midline pelvis along the sigmoid colon. No significant bowel wall thickening or focal abnormality by  CT. Vascular/Lymphatic: No significant vascular findings are present. No enlarged abdominal or pelvic lymph nodes. Reproductive: Tubal occlusion radiopaque devices noted bilaterally. Uterus and adnexa normal in size. Slight nodular enhancement of the uterus, suspect small fibroids. No pelvic free fluid or fluid collection. Other: No abdominal wall hernia or abnormality. No abdominopelvic ascites. Musculoskeletal: No acute or significant osseous findings. IMPRESSION: No acute abdominal or pelvic finding by CT. Stable postop changes from partial colectomy without evidence recurrence or metastatic disease. Electronically Signed   By: MJerilynn Mages  Shick M.D.   On: 02/22/2018 08:24    Assessment & Plan:     AWalker Kehr MD

## 2021-10-06 DIAGNOSIS — M62830 Muscle spasm of back: Secondary | ICD-10-CM | POA: Diagnosis not present

## 2021-10-06 DIAGNOSIS — M545 Low back pain, unspecified: Secondary | ICD-10-CM | POA: Diagnosis not present

## 2021-11-12 ENCOUNTER — Ambulatory Visit: Payer: Federal, State, Local not specified - PPO | Admitting: Internal Medicine

## 2021-11-12 ENCOUNTER — Other Ambulatory Visit: Payer: Self-pay

## 2021-11-12 ENCOUNTER — Encounter: Payer: Self-pay | Admitting: Internal Medicine

## 2021-11-12 VITALS — BP 124/84 | HR 74 | Temp 98.9°F | Ht 65.0 in | Wt 137.6 lb

## 2021-11-12 DIAGNOSIS — K5900 Constipation, unspecified: Secondary | ICD-10-CM

## 2021-11-12 DIAGNOSIS — R1011 Right upper quadrant pain: Secondary | ICD-10-CM | POA: Diagnosis not present

## 2021-11-12 DIAGNOSIS — Z8589 Personal history of malignant neoplasm of other organs and systems: Secondary | ICD-10-CM

## 2021-11-12 MED ORDER — OMEPRAZOLE 40 MG PO CPDR: 40.0000 mg | DELAYED_RELEASE_CAPSULE | Freq: Every day | ORAL | 1 refills | Status: AC

## 2021-11-12 MED ORDER — AMOXICILLIN-POT CLAVULANATE 875-125 MG PO TABS: 1.0000 | ORAL_TABLET | Freq: Two times a day (BID) | ORAL | 0 refills | Status: DC

## 2021-11-12 NOTE — Assessment & Plan Note (Addendum)
New 12/22 Unclear etiology.  Start omeprazole.  Take Augmentin if worse.  Go to ER if worse RUQ [place] LFTs UA

## 2021-11-12 NOTE — Patient Instructions (Signed)
Senna as needed

## 2021-11-12 NOTE — Progress Notes (Signed)
Subjective:  Patient ID: Priscilla Hill, female    DOB: 04-08-1966  Age: 55 y.o. MRN: [id number]  CC: Abdominal Pain (Pt states been having pain for abt a month. Denies N/V)   HPI GRACI HULCE presents for RUQ pain x 4 weeks, constipation. No fever No n/v  Outpatient Medications Prior to Visit  Medication Sig Dispense Refill   b complex vitamins tablet Take 1 tablet by mouth daily. 100 tablet 3   Cholecalciferol (VITAMIN D3) 50 MCG (2000 UT) capsule Take 1 capsule (2,000 Units total) by mouth daily. 100 capsule 3   diclofenac Sodium (VOLTAREN) 1 % GEL Apply 1 application topically 4 (four) times daily. 100 g 3   glucosamine-chondroitin (MAX GLUCOSAMINE CHONDROITIN) 500-400 MG tablet Take 1 tablet by mouth 3 (three) times daily. 100 tablet 3   ibuprofen (ADVIL) 600 MG tablet Take twice a day  prn pain 60 tablet 3   Inulin (FIBER CHOICE) 1.5 g CHEW Take daily as directed     Melatonin 10 MG TABS Take 10 mg by mouth at bedtime.     Omega-3 Fatty Acids (OMEGA 3 PO) Take by mouth daily.     TURMERIC PO Take by mouth daily.     omeprazole (PRILOSEC) 40 MG capsule Take 1 capsule (40 mg total) by mouth daily. (Patient taking differently: Take 40 mg by mouth as needed.) 90 capsule 3   No facility-administered medications prior to visit.    ROS: Review of Systems  Constitutional:  Negative for activity change, appetite change, chills, fatigue and unexpected weight change.  HENT:  Negative for congestion, mouth sores and sinus pressure.   Eyes:  Negative for visual disturbance.  Respiratory:  Negative for cough and chest tightness.   Gastrointestinal:  Positive for abdominal pain and constipation. Negative for nausea.  Genitourinary:  Negative for difficulty urinating, frequency and vaginal pain.  Musculoskeletal:  Negative for back pain and gait problem.  Skin:  Negative for pallor and rash.  Neurological:  Negative for dizziness, tremors, weakness, numbness and headaches.   Psychiatric/Behavioral:  Negative for confusion and sleep disturbance.    Objective:  BP 124/84 (BP Location: Left Arm)    Pulse 74    Temp 98.9 F (37.2 C) (Oral)    Ht 5\' 5"  (1.651 m)    Wt 137 lb 9.6 oz (62.4 kg)    SpO2 95%    BMI 22.90 kg/m   BP Readings from Last 3 Encounters:  11/12/21 124/84  07/07/21 120/72  04/23/21 123/77    Wt Readings from Last 3 Encounters:  11/12/21 137 lb 9.6 oz (62.4 kg)  07/07/21 140 lb 12.8 oz (63.9 kg)  04/23/21 139 lb 12.8 oz (63.4 kg)    Physical Exam Constitutional:      General: She is not in acute distress.    Appearance: She is well-developed.  HENT:     Head: Normocephalic.     Right Ear: External ear normal.     Left Ear: External ear normal.     Nose: Nose normal.  Eyes:     General:        Right eye: No discharge.        Left eye: No discharge.     Conjunctiva/sclera: Conjunctivae normal.     Pupils: Pupils are equal, round, and reactive to light.  Neck:     Thyroid: No thyromegaly.     Vascular: No JVD.     Trachea: No tracheal deviation.  Cardiovascular:  Rate and Rhythm: Normal rate and regular rhythm.     Heart sounds: Normal heart sounds.  Pulmonary:     Effort: No respiratory distress.     Breath sounds: No stridor. No wheezing.  Abdominal:     General: Bowel sounds are normal. There is no distension.     Palpations: Abdomen is soft. There is no mass.     Tenderness: There is no abdominal tenderness. There is no guarding or rebound.  Musculoskeletal:        General: No tenderness.     Cervical back: Normal range of motion and neck supple. No rigidity.  Lymphadenopathy:     Cervical: No cervical adenopathy.  Skin:    Findings: No erythema or rash.  Neurological:     Cranial Nerves: No cranial nerve deficit.     Motor: No abnormal muscle tone.     Coordination: Coordination normal.     Deep Tendon Reflexes: Reflexes normal.  Psychiatric:        Behavior: Behavior normal.        Thought Content:  Thought content normal.        Judgment: Judgment normal.    Lab Results  Component Value Date   WBC 5.6 11/12/2021   HGB 13.0 11/12/2021   HCT 39.7 11/12/2021   PLT 240.0 11/12/2021   GLUCOSE 74 11/12/2021   CHOL 243 (H) 07/07/2021   TRIG 92.0 07/07/2021   HDL 56.20 07/07/2021   LDLCALC 169 (H) 07/07/2021   ALT 18 11/12/2021   AST 25 11/12/2021   NA 141 11/12/2021   K 3.9 11/12/2021   CL 104 11/12/2021   CREATININE 0.72 11/12/2021   BUN 11 11/12/2021   CO2 29 11/12/2021   TSH 1.51 07/07/2021   INR 0.94 05/23/2013    CT Abdomen Pelvis W Contrast  Result Date: 02/22/2018 CLINICAL DATA:  Intermittent right lower quadrant pain for 1 year. 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 EXAM: CT ABDOMEN AND PELVIS WITH CONTRAST TECHNIQUE: Multidetector CT imaging of the abdomen and pelvis was performed using the standard protocol following bolus administration of intravenous contrast. CONTRAST:  194mL ISOVUE-300 IOPAMIDOL (ISOVUE-300) INJECTION 61% COMPARISON:  03/24/2016 FINDINGS: Lower chest: Minor basilar atelectasis. Normal heart size. No pericardial or pleural effusion. Hepatobiliary: No focal liver abnormality is seen. No gallstones, gallbladder wall thickening, or biliary dilatation. Pancreas: Unremarkable. No pancreatic ductal dilatation or surrounding inflammatory changes. Spleen: Normal in size without focal abnormality. Adrenals/Urinary Tract: Normal adrenal glands. Kidneys demonstrate no acute obstructive uropathy, hydronephrosis, focal abnormality. Ureters are symmetric and decompressed. Air noted within the bladder dome anteriorly, nonspecific, query recent instrumentation or catheterization. No bladder wall thickening. Stomach/Bowel: Negative for bowel obstruction, significant dilatation, ileus, or free air. Normal appendix demonstrated. Previous partial colectomy with an anastomosis in the midline pelvis along the sigmoid colon. No significant bowel wall  thickening or focal abnormality by CT. Vascular/Lymphatic: No significant vascular findings are present. No enlarged abdominal or pelvic lymph nodes. Reproductive: Tubal occlusion radiopaque devices noted bilaterally. Uterus and adnexa normal in size. Slight nodular enhancement of the uterus, suspect small fibroids. No pelvic free fluid or fluid collection. Other: No abdominal wall hernia or abnormality. No abdominopelvic ascites. Musculoskeletal: No acute or significant osseous findings. IMPRESSION: No acute abdominal or pelvic finding by CT. Stable postop changes from partial colectomy without evidence recurrence or metastatic disease. Electronically Signed   By: Jerilynn Mages.  Shick M.D.   On: 02/22/2018 08:24    Assessment & Plan:  Problem List Items Addressed This Visit     Constipation    New.  Use senna as needed      History of secondary colorectal cancer    Check CEA      RUQ abdominal pain - Primary    New 12/22 Unclear etiology.  Start omeprazole.  Take Augmentin if worse.  Go to ER if worse RUQ [place] LFTs UA       Relevant Orders   Comprehensive metabolic panel (Completed)   CBC with Differential/Platelet (Completed)   Urinalysis   CEA (Completed)   US Abdomen Limited RUQ (LIVER/GB)      Meds ordered this encounter  Medications   amoxicillin-clavulanate (AUGMENTIN) 875-125 MG tablet    Sig: Take 1 tablet by mouth 2 (two) times daily.    Dispense:  20 tablet    Refill:  0   omeprazole (PRILOSEC) 40 MG capsule    Sig: Take 1 capsule (40 mg total) by mouth daily.    Dispense:  90 capsule    Refill:  1      Follow-up: Return in about 6 weeks (around 12/24/2021) for a follow-up visit.  Walker Kehr, MD

## 2021-11-12 NOTE — Assessment & Plan Note (Signed)
Check CEA 

## 2021-11-13 LAB — CBC WITH DIFFERENTIAL/PLATELET
Basophils Absolute: 0 10*3/uL (ref 0.0–0.1)
Basophils Relative: 0.5 % (ref 0.0–3.0)
Eosinophils Absolute: 0.1 10*3/uL (ref 0.0–0.7)
Eosinophils Relative: 1.4 % (ref 0.0–5.0)
HCT: 39.7 % (ref 36.0–46.0)
Hemoglobin: 13 g/dL (ref 12.0–15.0)
Lymphocytes Relative: 32.3 % (ref 12.0–46.0)
Lymphs Abs: 1.8 10*3/uL (ref 0.7–4.0)
MCHC: 32.8 g/dL (ref 30.0–36.0)
MCV: 92.1 fl (ref 78.0–100.0)
Monocytes Absolute: 0.5 10*3/uL (ref 0.1–1.0)
Monocytes Relative: 8.4 % (ref 3.0–12.0)
Neutro Abs: 3.2 10*3/uL (ref 1.4–7.7)
Neutrophils Relative %: 57.4 % (ref 43.0–77.0)
Platelets: 240 10*3/uL (ref 150.0–400.0)
RBC: 4.31 Mil/uL (ref 3.87–5.11)
RDW: 13.2 % (ref 11.5–15.5)
WBC: 5.6 10*3/uL (ref 4.0–10.5)

## 2021-11-13 LAB — URINALYSIS, ROUTINE W REFLEX MICROSCOPIC
Bilirubin Urine: NEGATIVE
Ketones, ur: NEGATIVE
Leukocytes,Ua: NEGATIVE
Nitrite: NEGATIVE
Specific Gravity, Urine: 1.02 (ref 1.000–1.030)
Total Protein, Urine: NEGATIVE
Urine Glucose: NEGATIVE
Urobilinogen, UA: 0.2 (ref 0.0–1.0)
pH: 6.5 (ref 5.0–8.0)

## 2021-11-13 LAB — COMPREHENSIVE METABOLIC PANEL
ALT: 18 U/L (ref 0–35)
AST: 25 U/L (ref 0–37)
Albumin: 4.5 g/dL (ref 3.5–5.2)
Alkaline Phosphatase: 60 U/L (ref 39–117)
BUN: 11 mg/dL (ref 6–23)
CO2: 29 mEq/L (ref 19–32)
Calcium: 10 mg/dL (ref 8.4–10.5)
Chloride: 104 mEq/L (ref 96–112)
Creatinine, Ser: 0.72 mg/dL (ref 0.40–1.20)
GFR: 93.23 mL/min (ref >60.00)
Glucose, Bld: 74 mg/dL (ref 70–99)
Potassium: 3.9 mEq/L (ref 3.5–5.1)
Sodium: 141 mEq/L (ref 135–145)
Total Bilirubin: 0.7 mg/dL (ref 0.2–1.2)
Total Protein: 7.6 g/dL (ref 6.0–8.3)

## 2021-11-13 LAB — CEA: CEA: <0.5 ng/mL

## 2021-11-23 ENCOUNTER — Encounter: Payer: Self-pay | Admitting: Internal Medicine

## 2021-11-23 DIAGNOSIS — K5900 Constipation, unspecified: Secondary | ICD-10-CM | POA: Insufficient documentation

## 2021-11-23 NOTE — Assessment & Plan Note (Signed)
New.  Use senna as needed

## 2021-12-04 ENCOUNTER — Ambulatory Visit
Admission: RE | Admit: 2021-12-04 | Discharge: 2021-12-04 | Disposition: A | Discharge status: Home / Self Care | Payer: Federal, State, Local not specified - PPO | Source: Ambulatory Visit | Attending: Internal Medicine | Admitting: Internal Medicine

## 2021-12-04 DIAGNOSIS — R1011 Right upper quadrant pain: Secondary | ICD-10-CM

## 2022-04-02 ENCOUNTER — Encounter: Payer: Self-pay | Admitting: Nurse Practitioner

## 2022-04-02 ENCOUNTER — Telehealth: Payer: Self-pay

## 2022-04-02 ENCOUNTER — Inpatient Hospital Stay: Payer: Federal, State, Local not specified - PPO | Attending: Oncology

## 2022-04-02 ENCOUNTER — Inpatient Hospital Stay: Payer: Federal, State, Local not specified - PPO | Admitting: Nurse Practitioner

## 2022-04-02 VITALS — BP 108/71 | HR 85 | Temp 98.2°F | Resp 18 | Ht 65.0 in | Wt 143.4 lb

## 2022-04-02 DIAGNOSIS — Z9049 Acquired absence of other specified parts of digestive tract: Secondary | ICD-10-CM | POA: Insufficient documentation

## 2022-04-02 DIAGNOSIS — C187 Malignant neoplasm of sigmoid colon: Secondary | ICD-10-CM | POA: Diagnosis not present

## 2022-04-02 DIAGNOSIS — Z85038 Personal history of other malignant neoplasm of large intestine: Secondary | ICD-10-CM | POA: Diagnosis not present

## 2022-04-02 DIAGNOSIS — Z9221 Personal history of antineoplastic chemotherapy: Secondary | ICD-10-CM | POA: Diagnosis not present

## 2022-04-02 LAB — CEA (ACCESS): CEA (CHCC): <1 ng/mL (ref 0.00–5.00)

## 2022-04-02 NOTE — Telephone Encounter (Signed)
Patient gave verbal understanding and had no further questions or concerns  

## 2022-04-02 NOTE — Progress Notes (Signed)
?  [place] ?OFFICE PROGRESS NOTE ? ? ?Diagnosis: Colon cancer ? ?INTERVAL HISTORY:  ? ?Priscilla Hill returns as scheduled.  No change in bowel habits.  She notes occasional hemorrhoidal bleeding.  She has a good appetite. ? ?Objective: ? ?Vital signs in last 24 hours: ? ?Blood pressure 108/71, pulse 85, temperature 98.2 ?F (36.8 ?C), temperature source Oral, resp. rate 18, height $RemoveBe'5\' 5"'[id number]$  (1.651 m), weight 143 lb 6.4 oz (65 kg), SpO2 98 %. ?  ? ?Lymphatics: No palpable cervical, supraclavicular, axillary or inguinal lymph nodes. ?Resp: Lungs clear bilaterally. ?Cardio: Regular rate and rhythm. ?GI: Abdomen soft and nontender.  No hepatosplenomegaly.  No mass. ?Vascular: No leg edema. ? ?Lab Results: ? ?Lab Results  ?Component Value Date  ? WBC 5.6 11/12/2021  ? HGB 13.0 11/12/2021  ? HCT 39.7 11/12/2021  ? MCV 92.1 11/12/2021  ? PLT 240.0 11/12/2021  ? NEUTROABS 3.2 11/12/2021  ? ? ?Imaging: ? ?No results found. ? ?Medications: I have reviewed the patient's current medications. ? 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 The tumor returned microsatellite stable with no loss of mismatch repair protein expression. ?Cycle 1 adjuvant FOLFOX chemotherapy 05/28/2013.   ?Oxaliplatin was held beginning with cycle 8 due to neuropathy symptoms.   ?Final cycle of 5-fluorouracil/leucovorin given on 10/29/2013. ?03/04/2014 CEA less than 0.5. ?04/02/2014 CT chest/abdomen/pelvis negative for metastatic disease. ?Colonoscopy 04/17/2014-negative ?04/07/2015 CT chest/abdomen/pelvis negative for metastatic disease ?CTs of the chest, abdomen, and pelvis 03/24/2016-negative for recurrent colon cancer ?Surveillance colonoscopy 05/20/2017- 8 mm polyp in the ascending colon, 5 mm polyp in the transverse colon (Surgical [P], ascending, transverse, polyps (2) SESSILE SERRATED POLYP (FIVE FRAGMENTS). NO HIGH GRADE DYSPLASIA OR  MALIGNANCY. ?CT abdomen/pelvis 02/21/2018 to evaluate intermittent right lower quadrant pain- no evidence of recurrent or metastatic disease. ?Colonoscopy 08/01/2020- no polyps ?History of microcytic anemia.   ?Port-A-Cath placement 05/24/2013. ?History of uterine fibroids. ?Nausea following cycle 1 FOLFOX. Aloxi and prophylactic Decadron were added with cycle 2. She had nausea following cycle 2. Emend was added with cycle 3. Improved. No nausea following cycle 5 or 6. ?Mild neutropenia secondary chemotherapy. She received Neulasta beginning with cycle 4 FOLFOX   ?Abdominal discomfort following cycle 4 FOLFOX. Question gastritis related to steroids. She began Prilosec 20 mg daily. ?History of mild thrombocytopenia secondary chemotherapy. ?Pruritus and erythema over the palms beginning day 4 following cycle 5 FOLFOX and lasting approximately 3 days. Question hand-foot syndrome related to 5-fluorouracil, question allergic reaction. She was instructed to contact the office if she experiences this again. ?Oxaliplatin neuropathy affecting fingertips/toes and tongue. Persistent numbness in the feet  ?Soft mobile left axillary lymph node on exam 10/07/2014. Stable on exam 04/09/2015. Not noted on exam 10/06/2015 or 03/29/2016.  Present on exam 04/03/2018, 04/02/2020. ?  ? ?Disposition: Priscilla Hill remains in clinical remission from colon cancer.  We will follow-up on the CEA from today.  She would like to continue follow-up at the [facility].  She will return for CEA and follow-up visit in 1 year. ? ? ? ?Ned Card ANP/GNP-BC  ? ?04/02/2022  ?10:22 AM ? ? ? ? ? ? ? ?

## 2022-04-02 NOTE — Telephone Encounter (Signed)
-----   Message from Owens Shark, NP sent at 04/02/2022  1:48 PM EDT ----- ?Please let her know CEA is stable in normal range.  Follow-up as scheduled. ? ?

## 2022-04-08 NOTE — Progress Notes (Signed)
Office Visit Note  Patient: Priscilla Hill             Date of Birth: [birth year]           MRN: [id number]             PCP: Cassandria Anger, MD Referring: Cassandria Anger, MD Visit Date: 04/22/2022 Occupation: @GUAROCC @  Subjective:  Hair loss, joint pain.  History of Present Illness: Priscilla Hill is a 56 y.o. female with history of osteoarthritis and positive ANA.  She continues to have pain and discomfort in her bilateral hands, her knee joints and her lower back.  She has not noticed any joint swelling.  She denies any history of oral ulcers, nasal ulcers, malar rash, photosensitivity, raynaud's phenominon or lymphadenopathy.  She complains of dry eyes.  She states recently she has been experiencing a lot of hair loss.  She continues to have some fatigue.  She has been taking vitamin D on a regular basis.  Activities of Daily Living:  Patient reports morning stiffness for 5-10 minutes.   Patient Denies nocturnal pain.  Difficulty dressing/grooming: Denies Difficulty climbing stairs: Denies Difficulty getting out of chair: Denies Difficulty using hands for taps, buttons, cutlery, and/or writing: Denies  Review of Systems  Constitutional:  Positive for fatigue.  HENT:  Negative for mouth sores, mouth dryness and nose dryness.   Eyes:  Positive for dryness. Negative for pain and itching.  Respiratory:  Negative for shortness of breath and difficulty breathing.   Cardiovascular:  Negative for chest pain and palpitations.  Gastrointestinal:  Negative for blood in stool, constipation and diarrhea.  Endocrine: Negative for increased urination.  Genitourinary:  Negative for difficulty urinating.  Musculoskeletal:  Positive for joint pain, joint pain, joint swelling, myalgias, morning stiffness, muscle tenderness and myalgias.  Skin:  Negative for color change, rash, redness and sensitivity to sunlight.  Allergic/Immunologic: Negative for susceptible to infections.   Neurological:  Negative for dizziness, numbness, headaches, memory loss and weakness.  Hematological:  Positive for bruising/bleeding tendency. Negative for swollen glands.  Psychiatric/Behavioral:  Positive for sleep disturbance. Negative for depressed mood and confusion. The patient is not nervous/anxious.    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   Osteoarthritis, hand 02/28/2013   Well adult exam 11/14/2012   Hematochezia 11/14/2012   Paresthesia 11/14/2012   Dyslipidemia 11/14/2012   URI, acute 05/31/2011   Vaginitis 05/31/2011   Anxiety 04/17/2010   SHOULDER PAIN  04/17/2010   THROAT PAIN, CHRONIC 03/14/2009   DYSPHAGIA UNSPECIFIED 03/14/2009   CONJUNCTIVITIS 12/05/2008   ALLERGIC RHINITIS 10/08/2008   Nonspecific (abnormal) findings on radiological and other examination of body structure 10/08/2008   CHEST XRAY, ABNORMAL 10/08/2008    Acute bronchitis 09/24/2008   COUGH 09/24/2008    Past Medical History:  Diagnosis Date   Anemia    Anxiety    Arthritis    Cancer ([place])    GERD (gastroesophageal reflux disease)    H. pylori infection    positive IgG serology   History of multiple miscarriages    History of positive PPD    never any symptoms, due to being from [place]? different "vaccine"   Hyperlipidemia    mild    Family History  Problem Relation Age of Onset   Hypertension Mother    Diabetes Mother    Heart disease Mother    Stroke Father 26   Hypertension Father    Heart disease Maternal Grandmother    Heart disease Maternal Grandfather    Stomach cancer Other        grandmother   Healthy Daughter    Healthy Daughter    Colon cancer Neg Hx    Past Surgical History:  Procedure Laterality Date   childbirth   04-17-13   x 2 NVD   ESSURE TUBAL LIGATION     HYSTEROSCOPY WITH RESECTOSCOPE  04-17-13   "fibroids removed"   LAPAROSCOPIC PARTIAL COLECTOMY N/A 04/24/2013   Procedure: LAPAROSCOPIC ASSISTED SIGMOID COLECTOMY;  Surgeon: Odis Hollingshead, MD;  Location: [facility];  Service: General;  Laterality: N/A;   PORTACATH PLACEMENT Right 05/24/2013   Procedure: INSERTION PORT-A-CATH;  Surgeon: Odis Hollingshead, MD;  Location: [facility];  Service: General;  Laterality: Right;   Social History   Social History Narrative   Married, husband Lennette Bihari   #1 small 62 yo in home and cares for her 68 year old mother   Both her and husband have #2 grown children each from prior marriage-her's are in [place] at [facility] in [place], [place] in ADL's and able to drive   Speaks English well   Immunization History  Administered Date(s) Administered   MMR 07/22/2005   Moderna Sars-Covid-2 Vaccination 01/19/2020, 02/15/2020   Td 07/22/2005   Tdap 02/13/2018     Objective: Vital Signs: BP 108/74 (BP Location: Left Arm, Patient Position: Sitting, Cuff Size: Normal)   Pulse 79    Ht 5' 6"  (1.676 m)   Wt 144 lb 9.6 oz (65.6 kg)   LMP  (LMP Unknown)   BMI 23.34 kg/m    Physical Exam Vitals and nursing note reviewed.  Constitutional:      Appearance: She is well-developed.  HENT:     Head: Normocephalic and atraumatic.  Eyes:     Conjunctiva/sclera: Conjunctivae normal.  Cardiovascular:     Rate and Rhythm: Normal rate and regular rhythm.     Heart sounds: Normal heart sounds.  Pulmonary:     Effort: Pulmonary effort is normal.     Breath sounds: Normal breath sounds.  Abdominal:     General: Bowel sounds are normal.     Palpations: Abdomen is soft.  Musculoskeletal:     Cervical back: Normal range of motion.  Lymphadenopathy:     Cervical: No cervical adenopathy.  Skin:    General: Skin is warm and dry.     Capillary Refill: Capillary refill takes less  than 2 seconds.  Neurological:     Mental Status: She is alert and oriented to person, place, and time.  Psychiatric:        Behavior: Behavior normal.     Musculoskeletal Exam: C-spine, thoracic and lumbar spine were in good range of motion.  Shoulder joints, elbow joints, wrist joints, MCPs PIPs and DIPs with good range of motion with no synovitis.  She had bilateral PIP and DIP thickening consistent with osteoarthritis.  Hip joints and knee joints with good range of motion.  There was no tenderness over ankles or MTPs.  CDAI Exam: CDAI Score: -- Patient Global: --; Provider Global: -- Swollen: --; Tender: -- Joint Exam 04/22/2022   No joint exam has been documented for this visit   There is currently no information documented on the homunculus. Go to the Rheumatology activity and complete the homunculus joint exam.  Investigation: No additional findings.  Imaging: No results found.  Recent Labs: Lab Results  Component Value Date   WBC 5.6 11/12/2021   HGB 13.0 11/12/2021   PLT 240.0 11/12/2021   NA 141 11/12/2021   K 3.9 11/12/2021   CL 104 11/12/2021   CO2 29 11/12/2021    GLUCOSE 74 11/12/2021   BUN 11 11/12/2021   CREATININE 0.72 11/12/2021   BILITOT 0.7 11/12/2021   ALKPHOS 60 11/12/2021   AST 25 11/12/2021   ALT 18 11/12/2021   PROT 7.6 11/12/2021   ALBUMIN 4.5 11/12/2021   CALCIUM 10.0 11/12/2021   GFRAA 115 04/23/2021    Speciality Comments: No specialty comments available.  Procedures:  No procedures performed Allergies: Patient has no known allergies.   Assessment / Plan:     Visit Diagnoses: Primary osteoarthritis of both hands-bilateral PIP and DIP thickening was noted.  No synovitis was noted.  Joint protection muscle strengthening was discussed.  Primary osteoarthritis of right knee - Mild to moderate osteoarthritis was noted on the x-rays initially.  She continues to have discomfort in her right knee joint.  No warmth swelling or effusion was noted.  Primary osteoarthritis of both feet - clinical and radiographic findings are consistent with osteoarthritis.  Proper fitting shoes were discussed.  Lumbar radiculitis -patient with history of intermittent lower back pain.  She did not have any discomfort with range of motion on examination.  She had no point tenderness.  Positive ANA (antinuclear antibody) - ANA 1: 160 nucleolar speckled.  History of sicca symptoms.  She had low titer positive anticardiolipin IgM which became negative on repeat testing.  She denies any history of oral ulcers, nasal ulcers, malar rash, photosensitivity, Raynaud's phenomenon or lymphadenopathy.  She gives history of fatigue, dry eyes and increased hair loss.  I will repeat labs today.- Plan: Protein / creatinine ratio, urine, Anti-DNA antibody, double-stranded, ANA, C3 and C4, Sedimentation rate  Other fatigue -she has been experiencing increased fatigue.  Plan: CBC with Differential/Platelet, COMPLETE METABOLIC PANEL WITH GFR, VITAMIN D 25 Hydroxy (Vit-D Deficiency, Fractures), TSH  Hair loss -she complains of increased hair loss.  She is concerned about iron  deficiency.  We will check following labs today.  We will call her back with the results when available.  She has been using over-the-counter Biotene.  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  Gravida 5 para 2 miscarriages 3  Vitamin D deficiency-she is on vitamin D supplement.  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  She had menopause at the age of 10.  We will schedule DEXA scan to evaluate  BMD.  Plan: DG Bone Density  Postmenopausal - Plan: DG Bone Density  Orders: Orders Placed This Encounter  Procedures   DG Bone Density   Protein / creatinine ratio, urine   CBC with Differential/Platelet   COMPLETE METABOLIC PANEL WITH GFR   Anti-DNA antibody, double-stranded   ANA   C3 and C4   VITAMIN D 25 Hydroxy (Vit-D Deficiency, Fractures)   Sedimentation rate   TSH   Iron, TIBC and Ferritin Panel   No orders of the defined types were placed in this encounter.  .  Follow-Up Instructions: Return in about 6 months (around 10/22/2022) for Osteoarthritis.   Bo Merino, MD  Note - This record has been created using Editor, commissioning.  Chart creation errors have been sought, but may not always  have been located. Such creation errors do not reflect on  the standard of medical care.

## 2022-04-22 ENCOUNTER — Encounter: Payer: Self-pay | Admitting: Rheumatology

## 2022-04-22 ENCOUNTER — Ambulatory Visit: Payer: Federal, State, Local not specified - PPO | Admitting: Rheumatology

## 2022-04-22 VITALS — BP 108/74 | HR 79 | Ht 66.0 in | Wt 144.6 lb

## 2022-04-22 DIAGNOSIS — M19041 Primary osteoarthritis, right hand: Secondary | ICD-10-CM

## 2022-04-22 DIAGNOSIS — M5416 Radiculopathy, lumbar region: Secondary | ICD-10-CM

## 2022-04-22 DIAGNOSIS — C187 Malignant neoplasm of sigmoid colon: Secondary | ICD-10-CM

## 2022-04-22 DIAGNOSIS — E559 Vitamin D deficiency, unspecified: Secondary | ICD-10-CM

## 2022-04-22 DIAGNOSIS — G620 Drug-induced polyneuropathy: Secondary | ICD-10-CM

## 2022-04-22 DIAGNOSIS — D500 Iron deficiency anemia secondary to blood loss (chronic): Secondary | ICD-10-CM

## 2022-04-22 DIAGNOSIS — L659 Nonscarring hair loss, unspecified: Secondary | ICD-10-CM

## 2022-04-22 DIAGNOSIS — R5383 Other fatigue: Secondary | ICD-10-CM | POA: Diagnosis not present

## 2022-04-22 DIAGNOSIS — K2970 Gastritis, unspecified, without bleeding: Secondary | ICD-10-CM

## 2022-04-22 DIAGNOSIS — Z13820 Encounter for screening for osteoporosis: Secondary | ICD-10-CM

## 2022-04-22 DIAGNOSIS — M19072 Primary osteoarthritis, left ankle and foot: Secondary | ICD-10-CM

## 2022-04-22 DIAGNOSIS — B351 Tinea unguium: Secondary | ICD-10-CM

## 2022-04-22 DIAGNOSIS — E785 Hyperlipidemia, unspecified: Secondary | ICD-10-CM

## 2022-04-22 DIAGNOSIS — R768 Other specified abnormal immunological findings in serum: Secondary | ICD-10-CM | POA: Diagnosis not present

## 2022-04-22 DIAGNOSIS — Z780 Asymptomatic menopausal state: Secondary | ICD-10-CM

## 2022-04-22 DIAGNOSIS — M19071 Primary osteoarthritis, right ankle and foot: Secondary | ICD-10-CM

## 2022-04-22 DIAGNOSIS — M1711 Unilateral primary osteoarthritis, right knee: Secondary | ICD-10-CM

## 2022-04-22 DIAGNOSIS — Z9289 Personal history of other medical treatment: Secondary | ICD-10-CM

## 2022-04-22 DIAGNOSIS — N96 Recurrent pregnancy loss: Secondary | ICD-10-CM

## 2022-04-22 DIAGNOSIS — K2990 Gastroduodenitis, unspecified, without bleeding: Secondary | ICD-10-CM

## 2022-04-22 DIAGNOSIS — M19042 Primary osteoarthritis, left hand: Secondary | ICD-10-CM

## 2022-04-24 LAB — CBC WITH DIFFERENTIAL/PLATELET
Absolute Monocytes: 557 cells/uL (ref 200–950)
Basophils Absolute: 38 cells/uL (ref 0–200)
Basophils Relative: 0.6 %
Eosinophils Absolute: 102 cells/uL (ref 15–500)
Eosinophils Relative: 1.6 %
HCT: 38.5 % (ref 35.0–45.0)
Hemoglobin: 13.1 g/dL (ref 11.7–15.5)
Lymphs Abs: 1670 cells/uL (ref 850–3900)
MCH: 31.3 pg (ref 27.0–33.0)
MCHC: 34 g/dL (ref 32.0–36.0)
MCV: 92.1 fL (ref 80.0–100.0)
MPV: 11.2 fL (ref 7.5–12.5)
Monocytes Relative: 8.7 %
Neutro Abs: 4032 cells/uL (ref 1500–7800)
Neutrophils Relative %: 63 %
Platelets: 238 10*3/uL (ref 140–400)
RBC: 4.18 10*6/uL (ref 3.80–5.10)
RDW: 12.5 % (ref 11.0–15.0)
Total Lymphocyte: 26.1 %
WBC: 6.4 10*3/uL (ref 3.8–10.8)

## 2022-04-24 LAB — COMPLETE METABOLIC PANEL WITH GFR
AG Ratio: 1.6 (calc) (ref 1.0–2.5)
ALT: 18 U/L (ref 6–29)
AST: 18 U/L (ref 10–35)
Albumin: 4.3 g/dL (ref 3.6–5.1)
Alkaline phosphatase (APISO): 66 U/L (ref 37–153)
BUN: 14 mg/dL (ref 7–25)
CO2: 28 mmol/L (ref 20–32)
Calcium: 9.3 mg/dL (ref 8.6–10.4)
Chloride: 107 mmol/L (ref 98–110)
Creat: 1 mg/dL (ref 0.50–1.03)
Globulin: 2.7 g/dL (calc) (ref 1.9–3.7)
Glucose, Bld: 73 mg/dL (ref 65–99)
Potassium: 3.7 mmol/L (ref 3.5–5.3)
Sodium: 145 mmol/L (ref 135–146)
Total Bilirubin: 0.6 mg/dL (ref 0.2–1.2)
Total Protein: 7 g/dL (ref 6.1–8.1)
eGFR: 66 mL/min/{1.73_m2} (ref >=60)

## 2022-04-24 LAB — PROTEIN / CREATININE RATIO, URINE
Creatinine, Urine: 112 mg/dL (ref 20–275)
Protein/Creat Ratio: 63 mg/g creat (ref 24–184)
Protein/Creatinine Ratio: 0.063 mg/mg creat (ref 0.024–0.184)
Total Protein, Urine: 7 mg/dL (ref 5–24)

## 2022-04-24 LAB — C3 AND C4
C3 Complement: 123 mg/dL (ref 83–193)
C4 Complement: 32 mg/dL (ref 15–57)

## 2022-04-24 LAB — SEDIMENTATION RATE: Sed Rate: 19 mm/h (ref 0–30)

## 2022-04-24 LAB — IRON,TIBC AND FERRITIN PANEL
%SAT: 26 % (calc) (ref 16–45)
Ferritin: 45 ng/mL (ref 16–232)
Iron: 77 ug/dL (ref 45–160)
TIBC: 301 mcg/dL (calc) (ref 250–450)

## 2022-04-24 LAB — ANTI-NUCLEAR AB-TITER (ANA TITER): ANA Titer 1: 1:80 {titer} — ABNORMAL HIGH

## 2022-04-24 LAB — VITAMIN D 25 HYDROXY (VIT D DEFICIENCY, FRACTURES): Vit D, 25-Hydroxy: 30 ng/mL (ref 30–100)

## 2022-04-24 LAB — TSH: TSH: 1.35 mIU/L (ref 0.40–4.50)

## 2022-04-24 LAB — ANTI-DNA ANTIBODY, DOUBLE-STRANDED: ds DNA Ab: 5 IU/mL — ABNORMAL HIGH

## 2022-04-24 LAB — ANA: Anti Nuclear Antibody (ANA): POSITIVE — AB

## 2022-04-25 NOTE — Progress Notes (Signed)
ANA is low titer and stable.  dsDNA is low titer (indeterminate).  CBC, CMP, complements are normal.  Vitamin D is low normal.  Please advise patient to take vitamin D 2000 units daily.  Sed rate is normal.  TSH is normal.  Iron studies are normal.  Please advise patient to get double-stranded DNA in 3 months.

## 2022-04-27 ENCOUNTER — Other Ambulatory Visit: Payer: Self-pay | Admitting: *Deleted

## 2022-04-27 DIAGNOSIS — R768 Other specified abnormal immunological findings in serum: Secondary | ICD-10-CM

## 2022-06-05 DIAGNOSIS — K529 Noninfective gastroenteritis and colitis, unspecified: Secondary | ICD-10-CM | POA: Diagnosis not present

## 2022-06-05 DIAGNOSIS — R3129 Other microscopic hematuria: Secondary | ICD-10-CM | POA: Diagnosis not present

## 2022-06-07 ENCOUNTER — Other Ambulatory Visit: Payer: Self-pay | Admitting: Internal Medicine

## 2022-06-12 DIAGNOSIS — R319 Hematuria, unspecified: Secondary | ICD-10-CM | POA: Diagnosis not present

## 2022-07-08 ENCOUNTER — Encounter: Payer: Self-pay | Admitting: Internal Medicine

## 2022-07-08 ENCOUNTER — Ambulatory Visit: Payer: Federal, State, Local not specified - PPO | Admitting: Internal Medicine

## 2022-07-08 DIAGNOSIS — Z0000 Encounter for general adult medical examination without abnormal findings: Secondary | ICD-10-CM | POA: Diagnosis not present

## 2022-07-08 DIAGNOSIS — R3129 Other microscopic hematuria: Secondary | ICD-10-CM | POA: Diagnosis not present

## 2022-07-08 LAB — CBC WITH DIFFERENTIAL/PLATELET
Basophils Absolute: 0 K/uL (ref 0.0–0.1)
Basophils Relative: 0.7 % (ref 0.0–3.0)
Eosinophils Absolute: 0.1 K/uL (ref 0.0–0.7)
Eosinophils Relative: 2.2 % (ref 0.0–5.0)
HCT: 41.7 % (ref 36.0–46.0)
Hemoglobin: 13.8 g/dL (ref 12.0–15.0)
Lymphocytes Relative: 27.4 % (ref 12.0–46.0)
Lymphs Abs: 1.4 K/uL (ref 0.7–4.0)
MCHC: 33.1 g/dL (ref 30.0–36.0)
MCV: 90.3 fl (ref 78.0–100.0)
Monocytes Absolute: 0.4 K/uL (ref 0.1–1.0)
Monocytes Relative: 7.6 % (ref 3.0–12.0)
Neutro Abs: 3.3 K/uL (ref 1.4–7.7)
Neutrophils Relative %: 62.1 % (ref 43.0–77.0)
Platelets: 236 K/uL (ref 150.0–400.0)
RBC: 4.61 Mil/uL (ref 3.87–5.11)
RDW: 13.4 % (ref 11.5–15.5)
WBC: 5.2 K/uL (ref 4.0–10.5)

## 2022-07-08 LAB — URINALYSIS
Bilirubin Urine: NEGATIVE
Ketones, ur: NEGATIVE
Leukocytes,Ua: NEGATIVE
Nitrite: NEGATIVE
Specific Gravity, Urine: <=1.005 — AB (ref 1.000–1.030)
Total Protein, Urine: NEGATIVE
Urine Glucose: NEGATIVE
Urobilinogen, UA: 0.2 (ref 0.0–1.0)
pH: 7 (ref 5.0–8.0)

## 2022-07-08 LAB — COMPREHENSIVE METABOLIC PANEL WITH GFR
ALT: 16 U/L (ref 0–35)
AST: 19 U/L (ref 0–37)
Albumin: 4.5 g/dL (ref 3.5–5.2)
Alkaline Phosphatase: 69 U/L (ref 39–117)
BUN: 14 mg/dL (ref 6–23)
CO2: 29 meq/L (ref 19–32)
Calcium: 9.8 mg/dL (ref 8.4–10.5)
Chloride: 105 meq/L (ref 96–112)
Creatinine, Ser: 0.75 mg/dL (ref 0.40–1.20)
GFR: 88.37 mL/min
Glucose, Bld: 98 mg/dL (ref 70–99)
Potassium: 4.4 meq/L (ref 3.5–5.1)
Sodium: 143 meq/L (ref 135–145)
Total Bilirubin: 0.6 mg/dL (ref 0.2–1.2)
Total Protein: 7.6 g/dL (ref 6.0–8.3)

## 2022-07-08 LAB — LIPID PANEL
Cholesterol: 274 mg/dL — ABNORMAL HIGH (ref 0–200)
HDL: 64.2 mg/dL
LDL Cholesterol: 195 mg/dL — ABNORMAL HIGH (ref 0–99)
NonHDL: 209.64
Total CHOL/HDL Ratio: 4
Triglycerides: 72 mg/dL (ref 0.0–149.0)
VLDL: 14.4 mg/dL (ref 0.0–40.0)

## 2022-07-08 LAB — TSH: TSH: 1.43 u[IU]/mL (ref 0.35–5.50)

## 2022-07-08 NOTE — Assessment & Plan Note (Signed)
New Repeat UA Urol ref - Dr Claudia Desanctis

## 2022-07-08 NOTE — Assessment & Plan Note (Addendum)
We discussed age appropriate health related issues, including available/recomended screening tests and vaccinations. Labs were ordered to be later reviewed . All questions were answered. We discussed one or more of the following - seat belt use, use of sunscreen/sun exposure exercise, fall risk reduction, second hand smoke exposure, firearm use and storage, seat belt use, a need for adhering to healthy diet and exercise. Needs mammo/PAP, ophth appt - she will schedule Labs were ordered.  All questions were answered. A cardiac CT scan for calcium scoring offered Declined all shots

## 2022-07-08 NOTE — Progress Notes (Signed)
Subjective:  Patient ID: Priscilla Hill, female    DOB: [birth year]  Age: 56 y.o. MRN: [id number]  CC: No chief complaint on file.   HPI Priscilla Hill presents for a well exam C/o recent microhematuria - July 2023  Outpatient Medications Prior to Visit  Medication Sig Dispense Refill   b complex vitamins tablet Take 1 tablet by mouth daily. 100 tablet 3   BIOTIN PO Take by mouth daily.     diclofenac Sodium (VOLTAREN) 1 % GEL APPLY 1  TOPICALLY 4 TIMES DAILY 100 g 0   glucosamine-chondroitin (MAX GLUCOSAMINE CHONDROITIN) 500-400 MG tablet Take 1 tablet by mouth 3 (three) times daily. 100 tablet 3   ibuprofen (ADVIL) 600 MG tablet Take twice a day  prn pain 60 tablet 3   Melatonin 10 MG TABS Take 10 mg by mouth at bedtime as needed.     Omega-3 Fatty Acids (OMEGA 3 PO) Take by mouth daily.     omeprazole (PRILOSEC) 40 MG capsule Take 1 capsule (40 mg total) by mouth daily. (Patient taking differently: Take 40 mg by mouth as needed.) 90 capsule 1   TURMERIC PO Take by mouth daily.     Cholecalciferol (VITAMIN D3) 50 MCG (2000 UT) capsule Take 1 capsule (2,000 Units total) by mouth daily. (Patient not taking: Reported on 04/22/2022) 100 capsule 3   Inulin (FIBER CHOICE) 1.5 g CHEW Take daily as directed (Patient not taking: Reported on 04/22/2022)     No facility-administered medications prior to visit.    ROS: Review of Systems  Constitutional:  Negative for activity change, appetite change, chills, fatigue and unexpected weight change.  HENT:  Negative for congestion, mouth sores and sinus pressure.   Eyes:  Negative for visual disturbance.  Respiratory:  Negative for cough and chest tightness.   Gastrointestinal:  Negative for abdominal pain and nausea.  Genitourinary:  Negative for difficulty urinating, frequency and vaginal pain.  Musculoskeletal:  Negative for back pain and gait problem.  Skin:  Negative for pallor and rash.  Neurological:  Negative for dizziness, tremors,  weakness, numbness and headaches.  Psychiatric/Behavioral:  Negative for confusion and sleep disturbance.     Objective:  BP 110/80 (BP Location: Left Arm, Patient Position: Sitting, Cuff Size: Normal)   Pulse 72   Temp 99 F (37.2 C) (Oral)   Ht '5\' 6"'$  (1.676 m)   Wt 143 lb (64.9 kg)   LMP  (LMP Unknown)   SpO2 97%   BMI 23.08 kg/m   BP Readings from Last 3 Encounters:  07/08/22 110/80  04/22/22 108/74  04/02/22 108/71    Wt Readings from Last 3 Encounters:  07/08/22 143 lb (64.9 kg)  04/22/22 144 lb 9.6 oz (65.6 kg)  04/02/22 143 lb 6.4 oz (65 kg)    Physical Exam Constitutional:      General: She is not in acute distress.    Appearance: Normal appearance. She is well-developed.  HENT:     Head: Normocephalic.     Right Ear: External ear normal.     Left Ear: External ear normal.     Nose: Nose normal.  Eyes:     General:        Right eye: No discharge.        Left eye: No discharge.     Conjunctiva/sclera: Conjunctivae normal.     Pupils: Pupils are equal, round, and reactive to light.  Neck:     Thyroid: No thyromegaly.  Vascular: No JVD.     Trachea: No tracheal deviation.  Cardiovascular:     Rate and Rhythm: Normal rate and regular rhythm.     Heart sounds: Normal heart sounds.  Pulmonary:     Effort: No respiratory distress.     Breath sounds: No stridor. No wheezing.  Abdominal:     General: Bowel sounds are normal. There is no distension.     Palpations: Abdomen is soft. There is no mass.     Tenderness: There is no abdominal tenderness. There is no guarding or rebound.  Musculoskeletal:        General: No tenderness.     Cervical back: Normal range of motion and neck supple. No rigidity.  Lymphadenopathy:     Cervical: No cervical adenopathy.  Skin:    Findings: No erythema or rash.  Neurological:     Cranial Nerves: No cranial nerve deficit.     Motor: No abnormal muscle tone.     Coordination: Coordination normal.     Deep Tendon  Reflexes: Reflexes normal.  Psychiatric:        Behavior: Behavior normal.        Thought Content: Thought content normal.        Judgment: Judgment normal.     Lab Results  Component Value Date   WBC 6.4 04/22/2022   HGB 13.1 04/22/2022   HCT 38.5 04/22/2022   PLT 238 04/22/2022   GLUCOSE 73 04/22/2022   CHOL 243 (H) 07/07/2021   TRIG 92.0 07/07/2021   HDL 56.20 07/07/2021   LDLCALC 169 (H) 07/07/2021   ALT 18 04/22/2022   AST 18 04/22/2022   NA 145 04/22/2022   K 3.7 04/22/2022   CL 107 04/22/2022   CREATININE 1.00 04/22/2022   BUN 14 04/22/2022   CO2 28 04/22/2022   TSH 1.35 04/22/2022   INR 0.94 05/23/2013    US Abdomen Limited RUQ (LIVER/GB)  Result Date: 12/04/2021 CLINICAL DATA:  Right upper quadrant pain for 2 months EXAM: ULTRASOUND ABDOMEN LIMITED RIGHT UPPER QUADRANT COMPARISON:  CT abdomen pelvis 02/21/2018 FINDINGS: Gallbladder: No gallstones or wall thickening visualized. No sonographic Murphy sign noted by sonographer. Common bile duct: Diameter: 3 mm Liver: No focal lesion identified. Within normal limits in parenchymal echogenicity. Portal vein is patent on color Doppler imaging with normal direction of blood flow towards the liver. Other: None. IMPRESSION: No significant sonographic abnormality of the liver or gallbladder. Electronically Signed   By: Miachel Roux M.D.   On: 12/04/2021 10:27    Assessment & Plan:   Problem List Items Addressed This Visit     Microhematuria    New Repeat UA Urol ref - Dr Claudia Desanctis      Relevant Orders   Ambulatory referral to Urology   Well adult exam    We discussed age appropriate health related issues, including available/recomended screening tests and vaccinations. Labs were ordered to be later reviewed . All questions were answered. We discussed one or more of the following - seat belt use, use of sunscreen/sun exposure exercise, fall risk reduction, second hand smoke exposure, firearm use and storage, seat belt use, a  need for adhering to healthy diet and exercise. Needs mammo/PAP, ophth appt - she will schedule Labs were ordered.  All questions were answered. A cardiac CT scan for calcium scoring offered Declined all shots       Relevant Orders   TSH   Urinalysis   CBC with Differential/Platelet   Lipid panel  Comprehensive metabolic panel      No orders of the defined types were placed in this encounter.     Follow-up: Return in about 6 months (around 01/08/2023) for a follow-up visit.  Walker Kehr, MD

## 2022-07-08 NOTE — Patient Instructions (Signed)
Cardiac CT calcium scoring test $99    Computed tomography, more commonly known as a CT or CAT scan, is a diagnostic medical imaging test. Like traditional x-rays, it produces multiple images or pictures of the inside of the body. The cross-sectional images generated during a CT scan can be reformatted in multiple planes. They can even generate three-dimensional images. These images can be viewed on a computer monitor, printed on film or by a 3D printer, or transferred to a CD or DVD. CT images of internal organs, bones, soft tissue and blood vessels provide greater detail than traditional x-rays, particularly of soft tissues and blood vessels. A cardiac CT scan for coronary calcium is a non-invasive way of obtaining information about the presence, location and extent of calcified plaque in the coronary arteries--the vessels that supply oxygen-containing blood to the heart muscle. Calcified plaque results when there is a build-up of fat and other substances under the inner layer of the artery. This material can calcify which signals the presence of atherosclerosis, a disease of the vessel wall, also called coronary artery disease (CAD). People with this disease have an increased risk for heart attacks. In addition, over time, progression of plaque build up (CAD) can narrow the arteries or even close off blood flow to the heart. The result may be chest pain, sometimes called "angina," or a heart attack. Because calcium is a marker of CAD, the amount of calcium detected on a cardiac CT scan is a helpful prognostic tool. The findings on cardiac CT are expressed as a calcium score. Another name for this test is coronary artery calcium scoring.  What are some common uses of the procedure? The goal of cardiac CT scan for calcium scoring is to determine if CAD is present and to what extent, even if there are no symptoms. It is a screening study that may be recommended by a physician for patients with risk  factors for CAD but no clinical symptoms. The major risk factors for CAD are: high blood cholesterol levels  family history of heart attacks  diabetes  high blood pressure  cigarette smoking  overweight or obese  physical inactivity   A negative cardiac CT scan for calcium scoring shows no calcification within the coronary arteries. This suggests that CAD is absent or so minimal it cannot be seen by this technique. The chance of having a heart attack over the next two to five years is very low under these circumstances. A positive test means that CAD is present, regardless of whether or not the patient is experiencing any symptoms. The amount of calcification--expressed as the calcium score--may help to predict the likelihood of a myocardial infarction (heart attack) in the coming years and helps your medical doctor or cardiologist decide whether the patient may need to take preventive medicine or undertake other measures such as diet and exercise to lower the risk for heart attack. The extent of CAD is graded according to your calcium score:  Calcium Score  Presence of CAD (coronary artery disease)  0 No evidence of CAD   1-10 Minimal evidence of CAD  11-100 Mild evidence of CAD  101-400 Moderate evidence of CAD  Over 400 Extensive evidence of CAD   Coronary artery calcium (CAC) score is a strong predictor of incident coronary heart disease (CHD) and provides predictive information beyond traditional risk factors. CAC scoring is reasonable to use in the decision to withhold, postpone, or initiate statin therapy in intermediate-risk or selected borderline-risk asymptomatic adults (age 20-75  years and LDL-C >=70 to <190 mg/dL) who do not have diabetes or established atherosclerotic cardiovascular disease (ASCVD).* In intermediate-risk (10-year ASCVD risk >=7.5% to <20%) adults or selected borderline-risk (10-year ASCVD risk >=5% to <7.5%) adults in whom a CAC score is measured for the  purpose of making a treatment decision the following recommendations have been made:   If CAC=0, it is reasonable to withhold statin therapy and reassess in 5 to 10 years, as long as higher risk conditions are absent (diabetes mellitus, family history of premature CHD in first degree relatives (males <55 years; females <65 years), cigarette smoking, or LDL >=190 mg/dL).   If CAC is 1 to 99, it is reasonable to initiate statin therapy for patients >=19 years of age.   If CAC is >=100 or >=75th percentile, it is reasonable to initiate statin therapy at any age.   Cardiology referral should be considered for patients with CAC scores >=400 or >=75th percentile.   *2018 AHA/ACC/AACVPR/AAPA/ABC/ACPM/ADA/AGS/APhA/ASPC/NLA/PCNA Guideline on the Management of Blood Cholesterol: A Report of the American College of Cardiology/American Heart Association Task Force on Clinical Practice Guidelines. J Am Coll Cardiol. 2019;73(24):3168-3209.

## 2022-08-23 ENCOUNTER — Other Ambulatory Visit: Payer: Self-pay | Admitting: Family Medicine

## 2022-08-23 DIAGNOSIS — Z1231 Encounter for screening mammogram for malignant neoplasm of breast: Secondary | ICD-10-CM

## 2022-10-22 NOTE — Progress Notes (Signed)
Office Visit Note  Patient: Priscilla Hill             Date of Birth: 02-Jul-1966           MRN: [id number]             PCP: Cassandria Anger, MD Referring: Cassandria Anger, MD Visit Date: 11/05/2022 Occupation: _0 @  Subjective:  Right shoulder pain  History of Present Illness: Priscilla Hill is a 56 y.o. female history of osteoarthritis and positive ANA.  She states she has been having pain and discomfort in her right shoulder for the last few months.  She states she has discomfort when she sleeps on the right side of her shoulder.  She continues to have some discomfort in her hands and her knee joints.  She also has lower back pain.  She has been doing stretching exercises for her back.  She continues to have dry mouth and some hair loss.  She denies any history of oral ulcers, nasal ulcers, malar rash, photosensitivity, Raynaud's, lymphadenopathy or inflammatory arthritis.   Activities of Daily Living:  Patient reports morning stiffness for 10 minutes.   Patient Denies nocturnal pain.  Difficulty dressing/grooming: Denies Difficulty climbing stairs: Denies Difficulty getting out of chair: Denies Difficulty using hands for taps, buttons, cutlery, and/or writing: Denies  Review of Systems  Constitutional:  Negative for fatigue.  HENT:  Positive for mouth dryness. Negative for mouth sores.   Eyes:  Negative for dryness.  Respiratory:  Negative for shortness of breath.   Cardiovascular:  Negative for chest pain and palpitations.  Gastrointestinal:  Negative for blood in stool, constipation and diarrhea.  Endocrine: Negative for increased urination.  Genitourinary:  Negative for involuntary urination.  Musculoskeletal:  Positive for joint pain, joint pain and morning stiffness. Negative for gait problem, joint swelling, myalgias, muscle weakness, muscle tenderness and myalgias.  Skin:  Positive for hair loss. Negative for color change, rash and sensitivity to sunlight.   Allergic/Immunologic: Negative for susceptible to infections.  Neurological:  Negative for dizziness and headaches.  Hematological:  Negative for swollen glands.  Psychiatric/Behavioral:  Negative for depressed mood and sleep disturbance. The patient is not nervous/anxious.     PMFS History:  Patient Active Problem List   Diagnosis Date Noted   Microhematuria 07/08/2022   Constipation 11/23/2021   RUQ abdominal pain 11/12/2021   Osteoarthritis 07/03/2020   Insomnia 07/03/2020   Hand pain 01/03/2020   Lumbar radiculitis 06/28/2019   History of malignant neoplasm of colon 08/07/2018   RLQ abdominal pain 02/13/2018   Otitis externa 01/20/2018   Onychomycosis 01/20/2018   Subluxation of extensor carpi ulnaris tendon, right, initial encounter 03/02/2017   Ulnar neuropathy at wrist, right 10/13/2016   Wrist pain, acute, right 10/13/2016   Dizziness 02/28/2016   Impacted cerumen of right ear 01/14/2016   Acute sinus infection 11/12/2015   Gastritis and gastroduodenitis 10/13/2015   Alopecia 02/26/2015   Hot flashes 02/26/2015   Low back pain 11/25/2014   Vitamin D deficiency 10/27/2014   H. 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/28/2013   Osteoarthritis, hand 02/28/2013   Well adult exam 11/14/2012   Hematochezia 11/14/2012   Paresthesia 11/14/2012   Dyslipidemia 11/14/2012   URI, acute 05/31/2011   Vaginitis 05/31/2011  Anxiety 04/17/2010   SHOULDER PAIN 04/17/2010   THROAT PAIN, CHRONIC 03/14/2009   DYSPHAGIA UNSPECIFIED 03/14/2009   CONJUNCTIVITIS 12/05/2008   ALLERGIC RHINITIS 10/08/2008   Nonspecific (abnormal) findings on radiological and other examination of body structure 10/08/2008   CHEST XRAY, ABNORMAL 10/08/2008    Acute bronchitis 09/24/2008   COUGH 09/24/2008    Past Medical History:  Diagnosis Date   Anemia    Anxiety    Arthritis    Cancer ([place])    GERD (gastroesophageal reflux disease)    H. pylori infection    positive IgG serology   History of multiple miscarriages    History of positive PPD    never any symptoms, due to being from [place]? different "vaccine"   Hyperlipidemia    mild    Family History  Problem Relation Age of Onset   Hypertension Mother    Diabetes Mother    Heart disease Mother    Stroke Father 68   Hypertension Father    Heart disease Maternal Grandmother    Heart disease Maternal Grandfather    Stomach cancer Other        grandmother   Healthy Daughter    Healthy Daughter    Colon cancer Neg Hx    Past Surgical History:  Procedure Laterality Date   childbirth   04-17-13   x 2 NVD   ESSURE TUBAL LIGATION     HYSTEROSCOPY WITH RESECTOSCOPE  04-17-13   "fibroids removed"   LAPAROSCOPIC PARTIAL COLECTOMY N/A 04/24/2013   Procedure: LAPAROSCOPIC ASSISTED SIGMOID COLECTOMY;  Surgeon: Odis Hollingshead, MD;  Location: [facility];  Service: General;  Laterality: N/A;   PORTACATH PLACEMENT Right 05/24/2013   Procedure: INSERTION PORT-A-CATH;  Surgeon: Odis Hollingshead, MD;  Location: [facility];  Service: General;  Laterality: Right;   Social History   Social History Narrative   Married, husband Lennette Bihari   #1 small 44 yo in home and cares for her 91 year old mother   Both her and husband have #2 grown children each from prior marriage-her's are in [place] at [facility] in [place], [place] in [place] and able to drive   Speaks English well   Immunization History  Administered Date(s) Administered   MMR 07/22/2005   Moderna Sars-Covid-2 Vaccination 01/19/2020, 02/15/2020   Td 07/22/2005   Tdap 02/13/2018     Objective: Vital Signs: BP 118/76 (BP Location: Left Arm, Patient Position: Sitting, Cuff Size: Normal)   Pulse 78    Resp 14   Ht _0  (1.651 m)   Wt 147 lb 6.4 oz (66.9 kg)   LMP  (LMP Unknown)   BMI 24.53 kg/m    Physical Exam Vitals and nursing note reviewed.  Constitutional:      Appearance: She is well-developed.  HENT:     Head: Normocephalic and atraumatic.  Eyes:     Conjunctiva/sclera: Conjunctivae normal.  Cardiovascular:     Rate and Rhythm: Normal rate and regular rhythm.     Heart sounds: Normal heart sounds.  Pulmonary:     Effort: Pulmonary effort is normal.     Breath sounds: Normal breath sounds.  Abdominal:     General: Bowel sounds are normal.     Palpations: Abdomen is soft.  Musculoskeletal:     Cervical back: Normal range of motion.  Lymphadenopathy:     Cervical: No cervical adenopathy.  Skin:    General: Skin is warm and dry.  Capillary Refill: Capillary refill takes less than 2 seconds.  Neurological:     Mental Status: She is alert and oriented to person, place, and time.  Psychiatric:        Behavior: Behavior normal.      Musculoskeletal Exam: Cervical, thoracic and lumbar spine were in good range of motion.  She has some discomfort range of motion of her lumbar spine.  Shoulder joints with good range of motion.  She has some discomfort to the anterior aspect of her right shoulder joint.  Elbow joints, wrist joints, MCPs PIPs and DIPs been good range of motion with no synovitis.  Hip joints, knee joints with good range of motion.  There was no tenderness over ankles or MTPs.  CDAI Exam: CDAI Score: -- Patient Global: --; Provider Global: -- Swollen: --; Tender: -- Joint Exam 11/05/2022   No joint exam has been documented for this visit   There is currently no information documented on the homunculus. Go to the Rheumatology activity and complete the homunculus joint exam.  Investigation: No additional findings.  Imaging: No results found.  Recent Labs: Lab Results  Component Value Date   WBC 5.2 07/08/2022   HGB 13.8 07/08/2022   PLT 236.0  07/08/2022   NA 143 07/08/2022   K 4.4 07/08/2022   CL 105 07/08/2022   CO2 29 07/08/2022   GLUCOSE 98 07/08/2022   BUN 14 07/08/2022   CREATININE 0.75 07/08/2022   BILITOT 0.6 07/08/2022   ALKPHOS 69 07/08/2022   AST 19 07/08/2022   ALT 16 07/08/2022   PROT 7.6 07/08/2022   ALBUMIN 4.5 07/08/2022   CALCIUM 9.8 07/08/2022   GFRAA 115 04/23/2021    Speciality Comments: No specialty comments available.  Procedures:  No procedures performed Allergies: Patient has no known allergies.   Assessment / Plan:     Visit Diagnoses: Primary osteoarthritis of both hands-she continues to have some pain and stiffness in her bilateral hands.  No synovitis was noted.  Joint protection muscle strengthening was discussed.  Chronic right shoulder pain-she has been having pain and discomfort in her right shoulder joint.  She also gives history of nocturnal pain.  She had good range of motion with discomfort in the anterior aspect of her shoulder.  A handout on shoulder exercises was given.  Use of Voltaren gel was also discussed.  I advised patient to contact me if her shoulder symptoms do not improve.  Primary osteoarthritis of right knee -she continues to have intermittent pain in her right knee.  No warmth swelling or effusion was noted.  Lower extremity exercises were emphasized.  Mild to moderate osteoarthritis was noted on the x-rays initially.  Primary osteoarthritis of both feet - clinical and radiographic findings are consistent with osteoarthritis.  Proper fitting shoes were discussed.  Lumbar radiculitis-she has intermittent lower back pain.  She relates flares to her job.  A handout on back exercises was given.  Core strengthening exercises were discussed.  Positive ANA (antinuclear antibody) - ANA 1: 160 nucleolar speckled.  History of sicca symptoms.  Her ANA has been low titer and stable.  Double-stranded DNA was indeterminate at 1 time.  She continues to have hair loss and dry mouth.   She relates dry mouth to previous treatment for cancer.  Over-the-counter products for sicca symptoms were discussed at length.  She denies any history of oral ulcers, nasal ulcers, malar rash, Raynaud's phenomenon, lymphadenopathy or inflammatory arthritis.  I will recheck autoimmune labs at the  next visit.  Other fatigue-improved.  Hair loss-she continues to have some hair loss.  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  Gravida 5 para 2 miscarriages 3  Vitamin D deficiency-vitamin D was 30 on April 22, 2022.  Continued use of vitamin D was suggested.  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  DEXA was not performed.  Orders: No orders of the defined types were placed in this encounter.  No orders of the defined types were placed in this encounter.    Follow-Up Instructions: Return in about 6 months (around 05/07/2023) for Osteoarthritis.   Bo Merino, MD  Note - This record has been created using Editor, commissioning.  Chart creation errors have been sought, but may not always  have been located. Such creation errors do not reflect on  the standard of medical care.

## 2022-11-05 ENCOUNTER — Encounter: Payer: Self-pay | Admitting: Rheumatology

## 2022-11-05 ENCOUNTER — Ambulatory Visit: Payer: Federal, State, Local not specified - PPO | Attending: Rheumatology | Admitting: Rheumatology

## 2022-11-05 VITALS — BP 118/76 | HR 78 | Resp 14 | Ht 65.0 in | Wt 147.4 lb

## 2022-11-05 DIAGNOSIS — L659 Nonscarring hair loss, unspecified: Secondary | ICD-10-CM

## 2022-11-05 DIAGNOSIS — E785 Hyperlipidemia, unspecified: Secondary | ICD-10-CM

## 2022-11-05 DIAGNOSIS — M1711 Unilateral primary osteoarthritis, right knee: Secondary | ICD-10-CM | POA: Diagnosis not present

## 2022-11-05 DIAGNOSIS — E559 Vitamin D deficiency, unspecified: Secondary | ICD-10-CM

## 2022-11-05 DIAGNOSIS — M19041 Primary osteoarthritis, right hand: Secondary | ICD-10-CM

## 2022-11-05 DIAGNOSIS — K2990 Gastroduodenitis, unspecified, without bleeding: Secondary | ICD-10-CM

## 2022-11-05 DIAGNOSIS — M19042 Primary osteoarthritis, left hand: Secondary | ICD-10-CM

## 2022-11-05 DIAGNOSIS — Z124 Encounter for screening for malignant neoplasm of cervix: Secondary | ICD-10-CM | POA: Diagnosis not present

## 2022-11-05 DIAGNOSIS — K2970 Gastritis, unspecified, without bleeding: Secondary | ICD-10-CM

## 2022-11-05 DIAGNOSIS — M25511 Pain in right shoulder: Secondary | ICD-10-CM | POA: Diagnosis not present

## 2022-11-05 DIAGNOSIS — M5416 Radiculopathy, lumbar region: Secondary | ICD-10-CM

## 2022-11-05 DIAGNOSIS — G8929 Other chronic pain: Secondary | ICD-10-CM

## 2022-11-05 DIAGNOSIS — M19072 Primary osteoarthritis, left ankle and foot: Secondary | ICD-10-CM

## 2022-11-05 DIAGNOSIS — M19071 Primary osteoarthritis, right ankle and foot: Secondary | ICD-10-CM

## 2022-11-05 DIAGNOSIS — R768 Other specified abnormal immunological findings in serum: Secondary | ICD-10-CM

## 2022-11-05 DIAGNOSIS — Z01419 Encounter for gynecological examination (general) (routine) without abnormal findings: Secondary | ICD-10-CM | POA: Diagnosis not present

## 2022-11-05 DIAGNOSIS — Z6823 Body mass index (BMI) 23.0-23.9, adult: Secondary | ICD-10-CM | POA: Diagnosis not present

## 2022-11-05 DIAGNOSIS — C187 Malignant neoplasm of sigmoid colon: Secondary | ICD-10-CM

## 2022-11-05 DIAGNOSIS — Z13820 Encounter for screening for osteoporosis: Secondary | ICD-10-CM

## 2022-11-05 DIAGNOSIS — Z1231 Encounter for screening mammogram for malignant neoplasm of breast: Secondary | ICD-10-CM | POA: Diagnosis not present

## 2022-11-05 DIAGNOSIS — B351 Tinea unguium: Secondary | ICD-10-CM

## 2022-11-05 DIAGNOSIS — N96 Recurrent pregnancy loss: Secondary | ICD-10-CM

## 2022-11-05 DIAGNOSIS — R5383 Other fatigue: Secondary | ICD-10-CM

## 2022-11-05 DIAGNOSIS — G620 Drug-induced polyneuropathy: Secondary | ICD-10-CM

## 2022-11-05 DIAGNOSIS — Z9289 Personal history of other medical treatment: Secondary | ICD-10-CM

## 2022-11-05 DIAGNOSIS — D500 Iron deficiency anemia secondary to blood loss (chronic): Secondary | ICD-10-CM

## 2022-11-05 LAB — HM MAMMOGRAPHY

## 2022-11-05 NOTE — Patient Instructions (Signed)
Shoulder Exercises Ask your health care provider which exercises are safe for you. Do exercises exactly as told by your health care provider and adjust them as directed. It is normal to feel mild stretching, pulling, tightness, or discomfort as you do these exercises. Stop right away if you feel sudden pain or your pain gets worse. Do not begin these exercises until told by your health care provider. Stretching exercises External rotation and abduction This exercise is sometimes called corner stretch. The exercise rotates your arm outward (external rotation) and moves your arm out from your body (abduction). Stand in a doorway with one of your feet slightly in front of the other. This is called a staggered stance. If you cannot reach your forearms to the door frame, stand facing a corner of a room. Choose one of the following positions as told by your health care provider: Place your hands and forearms on the door frame above your head. Place your hands and forearms on the door frame at the height of your head. Place your hands on the door frame at the height of your elbows. Slowly move your weight onto your front foot until you feel a stretch across your chest and in the front of your shoulders. Keep your head and chest upright and keep your abdominal muscles tight. Hold for __________ seconds. To release the stretch, shift your weight to your back foot. Repeat __________ times. Complete this exercise __________ times a day. Extension, standing  Stand and hold a broomstick, a cane, or a similar object behind your back. Your hands should be a little wider than shoulder-width apart. Your palms should face away from your back. Keeping your elbows straight and your shoulder muscles relaxed, move the stick away from your body until you feel a stretch in your shoulders (extension). Avoid shrugging your shoulders while you move the stick. Keep your shoulder blades tucked down toward the middle of your  back. Hold for __________ seconds. Slowly return to the starting position. Repeat __________ times. Complete this exercise __________ times a day. Range-of-motion exercises Pendulum  Stand near a wall or a surface that you can hold onto for balance. Bend at the waist and let your left / right arm hang straight down. Use your other arm to support you. Keep your back straight and do not lock your knees. Relax your left / right arm and shoulder muscles, and move your hips and your trunk so your left / right arm swings freely. Your arm should swing because of the motion of your body, not because you are using your arm or shoulder muscles. Keep moving your hips and trunk so your arm swings in the following directions, as told by your health care provider: Side to side. Forward and backward. In clockwise and counterclockwise circles. Continue each motion for __________ seconds, or for as long as told by your health care provider. Slowly return to the starting position. Repeat __________ times. Complete this exercise __________ times a day. Shoulder flexion, standing  Stand and hold a broomstick, a cane, or a similar object. Place your hands a little more than shoulder-width apart on the object. Your left / right hand should be palm-up, and your other hand should be palm-down. Keep your elbow straight and your shoulder muscles relaxed. Push the stick up with your healthy arm to raise your left / right arm in front of your body, and then over your head until you feel a stretch in your shoulder (flexion). Avoid shrugging your shoulder   while you raise your arm. Keep your shoulder blade tucked down toward the middle of your back. Hold for __________ seconds. Slowly return to the starting position. Repeat __________ times. Complete this exercise __________ times a day. Shoulder abduction, standing  Stand and hold a broomstick, a cane, or a similar object. Place your hands a little more than  shoulder-width apart on the object. Your left / right hand should be palm-up, and your other hand should be palm-down. Keep your elbow straight and your shoulder muscles relaxed. Push the object across your body toward your left / right side. Raise your left / right arm to the side of your body (abduction) until you feel a stretch in your shoulder. Do not raise your arm above shoulder height unless your health care provider tells you to do that. If directed, raise your arm over your head. Avoid shrugging your shoulder while you raise your arm. Keep your shoulder blade tucked down toward the middle of your back. Hold for __________ seconds. Slowly return to the starting position. Repeat __________ times. Complete this exercise __________ times a day. Internal rotation  Place your left / right hand behind your back, palm-up. Use your other hand to dangle an exercise band, a broomstick, or a similar object over your shoulder. Grasp the band with your left / right hand so you are holding on to both ends. Gently pull up on the band until you feel a stretch in the front of your left / right shoulder. The movement of your arm toward the center of your body is called internal rotation. Avoid shrugging your shoulder while you raise your arm. Keep your shoulder blade tucked down toward the middle of your back. Hold for __________ seconds. Release the stretch by letting go of the band and lowering your hands. Repeat __________ times. Complete this exercise __________ times a day. Strengthening exercises External rotation  Sit in a stable chair without armrests. Secure an exercise band to a stable object at elbow height on your left / right side. Place a soft object, such as a folded towel or a small pillow, between your left / right upper arm and your body to move your elbow about 4 inches (10 cm) away from your side. Hold the end of the exercise band so it is tight and there is no slack. Keeping your  elbow pressed against the soft object, slowly move your forearm out, away from your abdomen (external rotation). Keep your body steady so only your forearm moves. Hold for __________ seconds. Slowly return to the starting position. Repeat __________ times. Complete this exercise __________ times a day. Shoulder abduction  Sit in a stable chair without armrests, or stand up. Hold a __________ lb / kg weight in your left / right hand, or hold an exercise band with both hands. Start with your arms straight down and your left / right palm facing in, toward your body. Slowly lift your left / right hand out to your side (abduction). Do not lift your hand above shoulder height unless your health care provider tells you that this is safe. Keep your arms straight. Avoid shrugging your shoulder while you do this movement. Keep your shoulder blade tucked down toward the middle of your back. Hold for __________ seconds. Slowly lower your arm, and return to the starting position. Repeat __________ times. Complete this exercise __________ times a day. Shoulder extension  Sit in a stable chair without armrests, or stand up. Secure an exercise band to a   stable object in front of you so it is at shoulder height. Hold one end of the exercise band in each hand. Straighten your elbows and lift your hands up to shoulder height. Squeeze your shoulder blades together as you pull your hands down to the sides of your thighs (extension). Stop when your hands are straight down by your sides. Do not let your hands go behind your body. Hold for __________ seconds. Slowly return to the starting position. Repeat __________ times. Complete this exercise __________ times a day. Shoulder row  Sit in a stable chair without armrests, or stand up. Secure an exercise band to a stable object in front of you so it is at chest height. Hold one end of the exercise band in each hand. Position your palms so that your thumbs are  facing the ceiling (neutral position). Bend each of your elbows to a 90-degree angle (right angle) and keep your upper arms at your sides. Step back or move the chair back until the band is tight and there is no slack. Slowly pull your elbows back behind you. Hold for __________ seconds. Slowly return to the starting position. Repeat __________ times. Complete this exercise __________ times a day. Shoulder press-ups  Sit in a stable chair that has armrests. Sit upright, with your feet flat on the floor. Put your hands on the armrests so your elbows are bent and your fingers are pointing forward. Your hands should be about even with the sides of your body. Push down on the armrests and use your arms to lift yourself off the chair. Straighten your elbows and lift yourself up as much as you comfortably can. Move your shoulder blades down, and avoid letting your shoulders move up toward your ears. Keep your feet on the ground. As you get stronger, your feet should support less of your body weight as you lift yourself up. Hold for __________ seconds. Slowly lower yourself back into the chair. Repeat __________ times. Complete this exercise __________ times a day. Wall push-ups  Stand so you are facing a stable wall. Your feet should be about one arm-length away from the wall. Lean forward and place your palms on the wall at shoulder height. Keep your feet flat on the floor as you bend your elbows and lean forward toward the wall. Hold for __________ seconds. Straighten your elbows to push yourself back to the starting position. Repeat __________ times. Complete this exercise __________ times a day. This information is not intended to replace advice given to you by your health care provider. Make sure you discuss any questions you have with your health care provider. Document Revised: 12/29/2021 Document Reviewed: 12/29/2021 Elsevier Patient Education  [place]. Back Exercises The  following exercises strengthen the muscles that help to support the trunk (torso) and back. They also help to keep the lower back flexible. Doing these exercises can help to prevent or lessen existing low back pain. If you have back pain or discomfort, try doing these exercises 2-3 times each day or as told by your health care provider. As your pain improves, do them once each day, but increase the number of times that you repeat the steps for each exercise (do more repetitions). To prevent the recurrence of back pain, continue to do these exercises once each day or as told by your health care provider. Do exercises exactly as told by your health care provider and adjust them as directed. It is normal to feel mild stretching, pulling, tightness,  or discomfort as you do these exercises, but you should stop right away if you feel sudden pain or your pain gets worse. Exercises Single knee to chest Repeat these steps 3-5 times for each leg: Lie on your back on a firm bed or the floor with your legs extended. Bring one knee to your chest. Your other leg should stay extended and in contact with the floor. Hold your knee in place by grabbing your knee or thigh with both hands and hold. Pull on your knee until you feel a gentle stretch in your lower back or buttocks. Hold the stretch for 10-30 seconds. Slowly release and straighten your leg.  Pelvic tilt Repeat these steps 5-10 times: Lie on your back on a firm bed or the floor with your legs extended. Bend your knees so they are pointing toward the ceiling and your feet are flat on the floor. Tighten your lower abdominal muscles to press your lower back against the floor. This motion will tilt your pelvis so your tailbone points up toward the ceiling instead of pointing to your feet or the floor. With gentle tension and even breathing, hold this position for 5-10 seconds.  Cat-cow Repeat these steps until your lower back becomes more flexible: Get  into a hands-and-knees position on a firm bed or the floor. Keep your hands under your shoulders, and keep your knees under your hips. You may place padding under your knees for comfort. Let your head hang down toward your chest. Contract your abdominal muscles and point your tailbone toward the floor so your lower back becomes rounded like the back of a cat. Hold this position for 5 seconds. Slowly lift your head, let your abdominal muscles relax, and point your tailbone up toward the ceiling so your back forms a sagging arch like the back of a cow. Hold this position for 5 seconds.  Press-ups Repeat these steps 5-10 times: Lie on your abdomen (face-down) on a firm bed or the floor. Place your palms near your head, about shoulder-width apart. Keeping your back as relaxed as possible and keeping your hips on the floor, slowly straighten your arms to raise the top half of your body and lift your shoulders. Do not use your back muscles to raise your upper torso. You may adjust the placement of your hands to make yourself more comfortable. Hold this position for 5 seconds while you keep your back relaxed. Slowly return to lying flat on the floor.  Bridges Repeat these steps 10 times: Lie on your back on a firm bed or the floor. Bend your knees so they are pointing toward the ceiling and your feet are flat on the floor. Your arms should be flat at your sides, next to your body. Tighten your buttocks muscles and lift your buttocks off the floor until your waist is at almost the same height as your knees. You should feel the muscles working in your buttocks and the back of your thighs. If you do not feel these muscles, slide your feet 1-2 inches (2.5-5 cm) farther away from your buttocks. Hold this position for 3-5 seconds. Slowly lower your hips to the starting position, and allow your buttocks muscles to relax completely. If this exercise is too easy, try doing it with your arms crossed over your  chest. Abdominal crunches Repeat these steps 5-10 times: Lie on your back on a firm bed or the floor with your legs extended. Bend your knees so they are pointing toward the ceiling  and your feet are flat on the floor. Cross your arms over your chest. Tip your chin slightly toward your chest without bending your neck. Tighten your abdominal muscles and slowly raise your torso high enough to lift your shoulder blades a tiny bit off the floor. Avoid raising your torso higher than that because it can put too much stress on your lower back and does not help to strengthen your abdominal muscles. Slowly return to your starting position.  Back lifts Repeat these steps 5-10 times: Lie on your abdomen (face-down) with your arms at your sides, and rest your forehead on the floor. Tighten the muscles in your legs and your buttocks. Slowly lift your chest off the floor while you keep your hips pressed to the floor. Keep the back of your head in line with the curve in your back. Your eyes should be looking at the floor. Hold this position for 3-5 seconds. Slowly return to your starting position.  Contact a health care provider if: Your back pain or discomfort gets much worse when you do an exercise. Your worsening back pain or discomfort does not lessen within 2 hours after you exercise. If you have any of these problems, stop doing these exercises right away. Do not do them again unless your health care provider says that you can. Get help right away if: You develop sudden, severe back pain. If this happens, stop doing the exercises right away. Do not do them again unless your health care provider says that you can. This information is not intended to replace advice given to you by your health care provider. Make sure you discuss any questions you have with your health care provider. Document Revised: 05/05/2021 Document Reviewed: 01/21/2021 Elsevier Patient Education  [place].

## 2022-11-09 ENCOUNTER — Encounter: Payer: Self-pay | Admitting: Internal Medicine

## 2022-11-25 DIAGNOSIS — J09X2 Influenza due to identified novel influenza A virus with other respiratory manifestations: Secondary | ICD-10-CM | POA: Diagnosis not present

## 2022-12-01 DIAGNOSIS — R8279 Other abnormal findings on microbiological examination of urine: Secondary | ICD-10-CM | POA: Diagnosis not present

## 2022-12-01 DIAGNOSIS — R1084 Generalized abdominal pain: Secondary | ICD-10-CM | POA: Diagnosis not present

## 2023-04-01 ENCOUNTER — Inpatient Hospital Stay: Payer: Federal, State, Local not specified - PPO | Attending: Oncology

## 2023-04-01 ENCOUNTER — Inpatient Hospital Stay: Payer: Federal, State, Local not specified - PPO | Admitting: Oncology

## 2023-04-01 VITALS — BP 116/75 | HR 68 | Temp 98.2°F | Resp 18 | Ht 65.0 in | Wt 140.8 lb

## 2023-04-01 DIAGNOSIS — C187 Malignant neoplasm of sigmoid colon: Secondary | ICD-10-CM

## 2023-04-01 DIAGNOSIS — Z85038 Personal history of other malignant neoplasm of large intestine: Secondary | ICD-10-CM | POA: Insufficient documentation

## 2023-04-01 LAB — CEA (ACCESS): CEA (CHCC): 1.53 ng/mL (ref 0.00–5.00)

## 2023-04-01 NOTE — Progress Notes (Signed)
[facility] OFFICE PROGRESS NOTE   Diagnosis: Colon cancer  INTERVAL HISTORY:   Ms. Priscilla Hill returns as scheduled.  She feels well.  No difficulty with bowel or bladder function.  No bleeding.  She has mild numbness in the feet.  This does not interfere with activity.  Objective:  Vital signs in last 24 hours:  Blood pressure 116/75, pulse 68, temperature 98.2 F (36.8 C), temperature source Oral, resp. rate 18, height 5\' 5"  (1.651 m), weight 140 lb 12.8 oz (63.9 kg), SpO2 100 %.    Lymphatics: No cervical, supraclavicular, or inguinal nodes.  Soft mobile 1 cm bilateral axillary nodes Resp: Lungs clear bilaterally Cardio: Regular rate and rhythm GI: No hepatosplenomegaly, no mass, nontender Vascular: No leg edema   Lab Results:  Lab Results  Component Value Date   WBC 5.2 07/08/2022   HGB 13.8 07/08/2022   HCT 41.7 07/08/2022   MCV 90.3 07/08/2022   PLT 236.0 07/08/2022   NEUTROABS 3.3 07/08/2022    CMP  Lab Results  Component Value Date   NA 143 07/08/2022   K 4.4 07/08/2022   CL 105 07/08/2022   CO2 29 07/08/2022   GLUCOSE 98 07/08/2022   BUN 14 07/08/2022   CREATININE 0.75 07/08/2022   CALCIUM 9.8 07/08/2022   PROT 7.6 07/08/2022   ALBUMIN 4.5 07/08/2022   AST 19 07/08/2022   ALT 16 07/08/2022   ALKPHOS 69 07/08/2022   BILITOT 0.6 07/08/2022   GFRNONAA 99 04/23/2021   GFRAA 115 04/23/2021    Lab Results  Component Value Date   CEA1 <1.00 04/03/2021   CEA 1.53 04/01/2023    Medications: I have reviewed the patient's current medications.   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 The tumor returned microsatellite stable with no loss of mismatch repair protein expression. Cycle 1 adjuvant FOLFOX chemotherapy 05/28/2013.   Oxaliplatin was held beginning with cycle 8 due to neuropathy symptoms.   Final cycle of 5-fluorouracil/leucovorin  given on 10/29/2013. 03/04/2014 CEA less than 0.5. 04/02/2014 CT chest/abdomen/pelvis negative for metastatic disease. Colonoscopy 04/17/2014-negative 04/07/2015 CT chest/abdomen/pelvis negative for metastatic disease CTs of the chest, abdomen, and pelvis 03/24/2016-negative for recurrent colon cancer Surveillance colonoscopy 05/20/2017- 8 mm polyp in the ascending colon, 5 mm polyp in the transverse colon (Surgical [P], ascending, transverse, polyps (2) SESSILE SERRATED POLYP (FIVE FRAGMENTS). NO HIGH GRADE DYSPLASIA OR MALIGNANCY. CT abdomen/pelvis 02/21/2018 to evaluate intermittent right lower quadrant pain- no evidence of recurrent or metastatic disease. Colonoscopy 08/01/2020- no polyps History of microcytic anemia.   Port-A-Cath placement 05/24/2013. History of uterine fibroids. Nausea following cycle 1 FOLFOX. Aloxi and prophylactic Decadron were added with cycle 2. She had nausea following cycle 2. Emend was added with cycle 3. Improved. No nausea following cycle 5 or 6. Mild neutropenia secondary chemotherapy. She received Neulasta beginning with cycle 4 FOLFOX   Abdominal discomfort following cycle 4 FOLFOX. Question gastritis related to steroids. She began Prilosec 20 mg daily. History of mild thrombocytopenia secondary chemotherapy. Pruritus and erythema over the palms beginning day 4 following cycle 5 FOLFOX and lasting approximately 3 days. Question hand-foot syndrome related to 5-fluorouracil, question allergic reaction. She was instructed to contact the office if she experiences this again. Oxaliplatin neuropathy affecting fingertips/toes and tongue. Persistent numbness in the feet  Soft mobile left axillary lymph node on exam 10/07/2014. Stable on exam 04/09/2015. Not noted on exam 10/06/2015 or 03/29/2016.  Present on exam  04/03/2018, 04/02/2020, 04/01/2023     Disposition: Priscilla Hill is in clinical remission from colon cancer.  She would like to continue yearly follow-up at the  Cancer center with a CEA .She will return for an office visit in 1 year.  She will continue colonoscopy surveillance with Dr. Adela Lank.  Thornton Papas, MD  04/01/2023  11:25 AM

## 2023-04-04 ENCOUNTER — Telehealth: Payer: Self-pay | Admitting: *Deleted

## 2023-04-04 DIAGNOSIS — C187 Malignant neoplasm of sigmoid colon: Secondary | ICD-10-CM

## 2023-04-04 NOTE — Telephone Encounter (Signed)
Called Priscilla Hill to inform her that her CEA is still normal, but has increased. Dr. Truett Perna suggests recheck in 4 months. She agrees. Scheduling message sent.

## 2023-04-22 NOTE — Progress Notes (Signed)
Office Visit Note  Patient: Priscilla Hill             Date of Birth: 08/24/1966           MRN: [id number]             PCP: Tresa Garter, MD Referring: Tresa Garter, MD Visit Date: 05/06/2023 Occupation: @GUAROCC @  Subjective:  Pain in joints  History of Present Illness: Priscilla Hill is a 57 y.o. female with osteoarthritis and positive ANA.  She states still continues to have some stiffness in her hands, right shoulder her knee joints and lower back.  She has not noticed any joint swelling.  She denies any radiculopathy from her back into her lower extremities.  She still has dry mouth and dry eyes.  There is no history of oral ulcers, nasal ulcers, malar rash, or lymphadenopathy.  She gives history of photosensitivity and chronic hair loss.    Activities of Daily Living:  Patient reports morning stiffness for 30 minutes.   Patient Denies nocturnal pain.  Difficulty dressing/grooming: Denies Difficulty climbing stairs: Denies Difficulty getting out of chair: Denies Difficulty using hands for taps, buttons, cutlery, and/or writing: Denies  Review of Systems  Constitutional:  Positive for fatigue.  HENT:  Positive for mouth dryness. Negative for mouth sores.   Eyes:  Positive for dryness.  Respiratory:  Negative for shortness of breath.   Cardiovascular:  Negative for chest pain and palpitations.  Gastrointestinal:  Negative for blood in stool, constipation and diarrhea.  Endocrine: Negative for increased urination.  Genitourinary:  Negative for involuntary urination.  Musculoskeletal:  Positive for joint pain, joint pain, myalgias, muscle weakness, morning stiffness, muscle tenderness and myalgias. Negative for gait problem and joint swelling.  Skin:  Positive for hair loss and sensitivity to sunlight. Negative for color change and rash.  Allergic/Immunologic: Negative for susceptible to infections.  Neurological:  Negative for dizziness and headaches.   Hematological:  Negative for swollen glands.  Psychiatric/Behavioral:  Positive for sleep disturbance. Negative for depressed mood. The patient is not nervous/anxious.     PMFS History:  Patient Active Problem List   Diagnosis Date Noted   Microhematuria 07/08/2022   Constipation 11/23/2021   RUQ abdominal pain 11/12/2021   Osteoarthritis 07/03/2020   Insomnia 07/03/2020   Hand pain 01/03/2020   Lumbar radiculitis 06/28/2019   History of malignant neoplasm of colon 08/07/2018   RLQ abdominal pain 02/13/2018   Otitis externa 01/20/2018   Onychomycosis 01/20/2018   Subluxation of extensor carpi ulnaris tendon, right, initial encounter 03/02/2017   Ulnar neuropathy at wrist, right 10/13/2016   Wrist pain, acute, right 10/13/2016   Dizziness 02/28/2016   Impacted cerumen of right ear 01/14/2016   Acute sinus infection 11/12/2015   Gastritis and gastroduodenitis 10/13/2015   Alopecia 02/26/2015   Hot flashes 02/26/2015   Low back pain 11/25/2014   Vitamin D deficiency 10/27/2014   H. 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   History of secondary colorectal cancer 04/25/2013   Anemia due to chronic blood loss 04/25/2013   Colon cancer-sigmoid 04/05/2013   Antiphospholipid antibody syndrome (HCC) 02/28/2013   Osteoarthritis, hand 02/28/2013   Well adult exam 11/14/2012   Hematochezia 11/14/2012   Paresthesia 11/14/2012   Dyslipidemia 11/14/2012   URI, acute 05/31/2011   Vaginitis 05/31/2011   Anxiety 04/17/2010   SHOULDER PAIN 04/17/2010   THROAT PAIN, CHRONIC 03/14/2009   DYSPHAGIA UNSPECIFIED 03/14/2009  CONJUNCTIVITIS 12/05/2008   ALLERGIC RHINITIS 10/08/2008   Nonspecific (abnormal) findings on radiological and other examination of body structure 10/08/2008   CHEST XRAY, ABNORMAL 10/08/2008   Acute bronchitis 09/24/2008   COUGH 09/24/2008    Past Medical History:  Diagnosis Date   Anemia     Anxiety    Arthritis    Cancer (HCC)    GERD (gastroesophageal reflux disease)    H. pylori infection    positive IgG serology   History of multiple miscarriages    History of positive PPD    never any symptoms, due to being from [place]? different "vaccine"   Hyperlipidemia    mild    Family History  Problem Relation Age of Onset   Hypertension Mother    Diabetes Mother    Heart disease Mother    Stroke Father 43   Hypertension Father    Heart disease Maternal Grandmother    Heart disease Maternal Grandfather    Stomach cancer Other        grandmother   Healthy Daughter    Healthy Daughter    Colon cancer Neg Hx    Past Surgical History:  Procedure Laterality Date   childbirth   04-17-13   x 2 NVD   ESSURE TUBAL LIGATION     HYSTEROSCOPY WITH RESECTOSCOPE  04-17-13   "fibroids removed"   LAPAROSCOPIC PARTIAL COLECTOMY N/A 04/24/2013   Procedure: LAPAROSCOPIC ASSISTED SIGMOID COLECTOMY;  Surgeon: Adolph Pollack, MD;  Location: [facility];  Service: General;  Laterality: N/A;   PORTACATH PLACEMENT Right 05/24/2013   Procedure: INSERTION PORT-A-CATH;  Surgeon: Adolph Pollack, MD;  Location: [facility];  Service: General;  Laterality: Right;   Social History   Social History Narrative   Married, husband Caryn Bee   #1 small 6 yo in home and cares for her 28 year old mother   Both her and husband have #2 grown children each from prior marriage-her's are in [place]   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  Constitutional:      Appearance: She is well-developed.  HENT:     Head: Normocephalic and atraumatic.  Eyes:     Conjunctiva/sclera: Conjunctivae normal.  Cardiovascular:     Rate and Rhythm: Normal rate and regular rhythm.     Heart sounds: Normal heart sounds.  Pulmonary:     Effort: Pulmonary effort is normal.     Breath sounds: Normal breath sounds.  Abdominal:     General: Bowel sounds are normal.     Palpations: Abdomen is soft.  Musculoskeletal:     Cervical back: Normal range of motion.  Lymphadenopathy:     Cervical: No cervical adenopathy.  Skin:    General: Skin is warm and dry.     Capillary Refill: Capillary refill takes less than 2 seconds.     Comments: No nailbed  capillary changes or sclerodactyly was noted.  She had good capillary refill.  Neurological:     Mental Status: She is alert and oriented to person, place, and time.  Psychiatric:        Behavior: Behavior normal.      Musculoskeletal Exam: Cervical, thoracic and lumbar spine were in good range of motion.  She has some discomfort range of motion of right shoulder joint.  She had tenderness over the subacromial region.  Shoulder joints, elbow joints, wrist joints, MCPs PIPs and DIPs were in good range of motion.  She had bilateral PIP and DIP thickening.  Hip joints and knee joints in good range of motion without any warmth swelling or effusion.  There was no tenderness over ankles or MTPs.  Bilateral bunions were noted.  CDAI Exam: CDAI Score: -- Patient Global: --; Provider Global: -- Swollen: --; Tender: -- Joint Exam 05/06/2023   No joint exam has been documented for this visit   There is currently no information documented on the homunculus. Go to the Rheumatology activity and complete the homunculus joint exam.  Investigation: No additional findings.  Imaging: No results found.  Recent Labs: Lab Results  Component Value Date   WBC  5.2 07/08/2022   HGB 13.8 07/08/2022   PLT 236.0 07/08/2022   NA 143 07/08/2022   K 4.4 07/08/2022   CL 105 07/08/2022   CO2 29 07/08/2022   GLUCOSE 98 07/08/2022   BUN 14 07/08/2022   CREATININE 0.75 07/08/2022   BILITOT 0.6 07/08/2022   ALKPHOS 69 07/08/2022   AST 19 07/08/2022   ALT 16 07/08/2022   PROT 7.6 07/08/2022   ALBUMIN 4.5 07/08/2022   CALCIUM 9.8 07/08/2022   GFRAA 115 04/23/2021    Speciality Comments: No specialty comments available.  Procedures:  No procedures performed Allergies: Patient has no known allergies.   Assessment / Plan:     Visit Diagnoses: Primary osteoarthritis of both hands-she continues to have  pain and stiffness in her hands.  Bilateral PIP and DIP thickening was noted.  Joint protection muscle strengthening was discussed.  Chronic right shoulder pain-she continues to have intermittent discomfort in her right shoulder.  She had tenderness over right subacromial region.  A handout on shoulder joint muscle strengthening exercises was given.  Primary osteoarthritis of right knee -she has intermittent discomfort in the right knee joint.  No warmth swelling or effusion was noted.  Mild to moderate osteoarthritis was noted on the x-rays initially.  Primary osteoarthritis of both feet -she has bilateral bunions.  She has a stiffness in her feet.  No synovitis was noted.  Clinical and radiographic findings are consistent with osteoarthritis.  Wide fitting shoes were advised.  Lumbar radiculitis-she still experiences lower back pain.  She has not had any recent radiculopathy.  She did not get x-rays of her lumbar spine.  A handout on lower back exercises was given.  Positive ANA (antinuclear antibody) - ANA 1: 160 nucleolar speckled.  History of sicca symptoms.  Her ANA has been low titer and stable.  Double-stranded DNA was indeterminate.  She denies any history of oral ulcers, nasal ulcers, malar rash, lymphadenopathy or inflammatory arthritis.   Patient states that she has dry mouth and dry eyes since she had treatment for colon cancer.  She also experiences photosensitivity.  Use of sunscreen was advised.  Patient declined labs today.  Will check labs at the next visit.  She was advised to contact us if she develops any  new symptoms.  Other fatigue-she has noticed some improvement in her fatigue.  Hair loss-has been stable for patient.  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  DEXA was not performed.  Orders: No orders of the defined types were placed in this encounter.  No orders of the defined types were placed in this encounter.    Follow-Up Instructions: Return in about 1 year (around 05/05/2024) for Osteoarthritis.   Pollyann Savoy, MD  Note - This record has been created using Animal nutritionist.  Chart creation errors have been sought, but may not always  have been located. Such creation errors do not reflect on  the standard of medical care.

## 2023-05-06 ENCOUNTER — Encounter: Payer: Self-pay | Admitting: Rheumatology

## 2023-05-06 ENCOUNTER — Ambulatory Visit: Payer: Federal, State, Local not specified - PPO | Attending: Rheumatology | Admitting: Rheumatology

## 2023-05-06 VITALS — BP 110/70 | HR 79 | Resp 15 | Ht 66.0 in | Wt 139.8 lb

## 2023-05-06 DIAGNOSIS — M1711 Unilateral primary osteoarthritis, right knee: Secondary | ICD-10-CM

## 2023-05-06 DIAGNOSIS — R768 Other specified abnormal immunological findings in serum: Secondary | ICD-10-CM

## 2023-05-06 DIAGNOSIS — M5416 Radiculopathy, lumbar region: Secondary | ICD-10-CM

## 2023-05-06 DIAGNOSIS — M19042 Primary osteoarthritis, left hand: Secondary | ICD-10-CM

## 2023-05-06 DIAGNOSIS — R5383 Other fatigue: Secondary | ICD-10-CM

## 2023-05-06 DIAGNOSIS — D500 Iron deficiency anemia secondary to blood loss (chronic): Secondary | ICD-10-CM

## 2023-05-06 DIAGNOSIS — M19041 Primary osteoarthritis, right hand: Secondary | ICD-10-CM | POA: Diagnosis not present

## 2023-05-06 DIAGNOSIS — B351 Tinea unguium: Secondary | ICD-10-CM

## 2023-05-06 DIAGNOSIS — E785 Hyperlipidemia, unspecified: Secondary | ICD-10-CM

## 2023-05-06 DIAGNOSIS — N96 Recurrent pregnancy loss: Secondary | ICD-10-CM

## 2023-05-06 DIAGNOSIS — G620 Drug-induced polyneuropathy: Secondary | ICD-10-CM

## 2023-05-06 DIAGNOSIS — M19071 Primary osteoarthritis, right ankle and foot: Secondary | ICD-10-CM | POA: Diagnosis not present

## 2023-05-06 DIAGNOSIS — Z9289 Personal history of other medical treatment: Secondary | ICD-10-CM

## 2023-05-06 DIAGNOSIS — K2970 Gastritis, unspecified, without bleeding: Secondary | ICD-10-CM

## 2023-05-06 DIAGNOSIS — L659 Nonscarring hair loss, unspecified: Secondary | ICD-10-CM

## 2023-05-06 DIAGNOSIS — E559 Vitamin D deficiency, unspecified: Secondary | ICD-10-CM

## 2023-05-06 DIAGNOSIS — M25511 Pain in right shoulder: Secondary | ICD-10-CM

## 2023-05-06 DIAGNOSIS — Z13820 Encounter for screening for osteoporosis: Secondary | ICD-10-CM

## 2023-05-06 DIAGNOSIS — M19072 Primary osteoarthritis, left ankle and foot: Secondary | ICD-10-CM

## 2023-05-06 DIAGNOSIS — K2990 Gastroduodenitis, unspecified, without bleeding: Secondary | ICD-10-CM

## 2023-05-06 DIAGNOSIS — C187 Malignant neoplasm of sigmoid colon: Secondary | ICD-10-CM

## 2023-05-06 DIAGNOSIS — G8929 Other chronic pain: Secondary | ICD-10-CM

## 2023-05-06 NOTE — Patient Instructions (Signed)
Shoulder Exercises Ask your health care provider which exercises are safe for you. Do exercises exactly as told by your health care provider and adjust them as directed. It is normal to feel mild stretching, pulling, tightness, or discomfort as you do these exercises. Stop right away if you feel sudden pain or your pain gets worse. Do not begin these exercises until told by your health care provider. Stretching exercises External rotation and abduction This exercise is sometimes called corner stretch. The exercise rotates your arm outward (external rotation) and moves your arm out from your body (abduction). Stand in a doorway with one of your feet slightly in front of the other. This is called a staggered stance. If you cannot reach your forearms to the door frame, stand facing a corner of a room. Choose one of the following positions as told by your health care provider: Place your hands and forearms on the door frame above your head. Place your hands and forearms on the door frame at the height of your head. Place your hands on the door frame at the height of your elbows. Slowly move your weight onto your front foot until you feel a stretch across your chest and in the front of your shoulders. Keep your head and chest upright and keep your abdominal muscles tight. Hold for __________ seconds. To release the stretch, shift your weight to your back foot. Repeat __________ times. Complete this exercise __________ times a day. Extension, standing  Stand and hold a broomstick, a cane, or a similar object behind your back. Your hands should be a little wider than shoulder-width apart. Your palms should face away from your back. Keeping your elbows straight and your shoulder muscles relaxed, move the stick away from your body until you feel a stretch in your shoulders (extension). Avoid shrugging your shoulders while you move the stick. Keep your shoulder blades tucked down toward the middle of your  back. Hold for __________ seconds. Slowly return to the starting position. Repeat __________ times. Complete this exercise __________ times a day. Range-of-motion exercises Pendulum  Stand near a wall or a surface that you can hold onto for balance. Bend at the waist and let your left / right arm hang straight down. Use your other arm to support you. Keep your back straight and do not lock your knees. Relax your left / right arm and shoulder muscles, and move your hips and your trunk so your left / right arm swings freely. Your arm should swing because of the motion of your body, not because you are using your arm or shoulder muscles. Keep moving your hips and trunk so your arm swings in the following directions, as told by your health care provider: Side to side. Forward and backward. In clockwise and counterclockwise circles. Continue each motion for __________ seconds, or for as long as told by your health care provider. Slowly return to the starting position. Repeat __________ times. Complete this exercise __________ times a day. Shoulder flexion, standing  Stand and hold a broomstick, a cane, or a similar object. Place your hands a little more than shoulder-width apart on the object. Your left / right hand should be palm-up, and your other hand should be palm-down. Keep your elbow straight and your shoulder muscles relaxed. Push the stick up with your healthy arm to raise your left / right arm in front of your body, and then over your head until you feel a stretch in your shoulder (flexion). Avoid shrugging your shoulder   while you raise your arm. Keep your shoulder blade tucked down toward the middle of your back. Hold for __________ seconds. Slowly return to the starting position. Repeat __________ times. Complete this exercise __________ times a day. Shoulder abduction, standing  Stand and hold a broomstick, a cane, or a similar object. Place your hands a little more than  shoulder-width apart on the object. Your left / right hand should be palm-up, and your other hand should be palm-down. Keep your elbow straight and your shoulder muscles relaxed. Push the object across your body toward your left / right side. Raise your left / right arm to the side of your body (abduction) until you feel a stretch in your shoulder. Do not raise your arm above shoulder height unless your health care provider tells you to do that. If directed, raise your arm over your head. Avoid shrugging your shoulder while you raise your arm. Keep your shoulder blade tucked down toward the middle of your back. Hold for __________ seconds. Slowly return to the starting position. Repeat __________ times. Complete this exercise __________ times a day. Internal rotation  Place your left / right hand behind your back, palm-up. Use your other hand to dangle an exercise band, a broomstick, or a similar object over your shoulder. Grasp the band with your left / right hand so you are holding on to both ends. Gently pull up on the band until you feel a stretch in the front of your left / right shoulder. The movement of your arm toward the center of your body is called internal rotation. Avoid shrugging your shoulder while you raise your arm. Keep your shoulder blade tucked down toward the middle of your back. Hold for __________ seconds. Release the stretch by letting go of the band and lowering your hands. Repeat __________ times. Complete this exercise __________ times a day. Strengthening exercises External rotation  Sit in a stable chair without armrests. Secure an exercise band to a stable object at elbow height on your left / right side. Place a soft object, such as a folded towel or a small pillow, between your left / right upper arm and your body to move your elbow about 4 inches (10 cm) away from your side. Hold the end of the exercise band so it is tight and there is no slack. Keeping your  elbow pressed against the soft object, slowly move your forearm out, away from your abdomen (external rotation). Keep your body steady so only your forearm moves. Hold for __________ seconds. Slowly return to the starting position. Repeat __________ times. Complete this exercise __________ times a day. Shoulder abduction  Sit in a stable chair without armrests, or stand up. Hold a __________ lb / kg weight in your left / right hand, or hold an exercise band with both hands. Start with your arms straight down and your left / right palm facing in, toward your body. Slowly lift your left / right hand out to your side (abduction). Do not lift your hand above shoulder height unless your health care provider tells you that this is safe. Keep your arms straight. Avoid shrugging your shoulder while you do this movement. Keep your shoulder blade tucked down toward the middle of your back. Hold for __________ seconds. Slowly lower your arm, and return to the starting position. Repeat __________ times. Complete this exercise __________ times a day. Shoulder extension  Sit in a stable chair without armrests, or stand up. Secure an exercise band to a   stable object in front of you so it is at shoulder height. Hold one end of the exercise band in each hand. Straighten your elbows and lift your hands up to shoulder height. Squeeze your shoulder blades together as you pull your hands down to the sides of your thighs (extension). Stop when your hands are straight down by your sides. Do not let your hands go behind your body. Hold for __________ seconds. Slowly return to the starting position. Repeat __________ times. Complete this exercise __________ times a day. Shoulder row  Sit in a stable chair without armrests, or stand up. Secure an exercise band to a stable object in front of you so it is at chest height. Hold one end of the exercise band in each hand. Position your palms so that your thumbs are  facing the ceiling (neutral position). Bend each of your elbows to a 90-degree angle (right angle) and keep your upper arms at your sides. Step back or move the chair back until the band is tight and there is no slack. Slowly pull your elbows back behind you. Hold for __________ seconds. Slowly return to the starting position. Repeat __________ times. Complete this exercise __________ times a day. Shoulder press-ups  Sit in a stable chair that has armrests. Sit upright, with your feet flat on the floor. Put your hands on the armrests so your elbows are bent and your fingers are pointing forward. Your hands should be about even with the sides of your body. Push down on the armrests and use your arms to lift yourself off the chair. Straighten your elbows and lift yourself up as much as you comfortably can. Move your shoulder blades down, and avoid letting your shoulders move up toward your ears. Keep your feet on the ground. As you get stronger, your feet should support less of your body weight as you lift yourself up. Hold for __________ seconds. Slowly lower yourself back into the chair. Repeat __________ times. Complete this exercise __________ times a day. Wall push-ups  Stand so you are facing a stable wall. Your feet should be about one arm-length away from the wall. Lean forward and place your palms on the wall at shoulder height. Keep your feet flat on the floor as you bend your elbows and lean forward toward the wall. Hold for __________ seconds. Straighten your elbows to push yourself back to the starting position. Repeat __________ times. Complete this exercise __________ times a day. This information is not intended to replace advice given to you by your health care provider. Make sure you discuss any questions you have with your health care provider. Document Revised: 12/29/2021 Document Reviewed: 12/29/2021 [employer] Patient Education  2024 [employer]  Low Back Sprain or  Strain Rehab Ask your health care provider which exercises are safe for you. Do exercises exactly as told by your health care provider and adjust them as directed. It is normal to feel mild stretching, pulling, tightness, or discomfort as you do these exercises. Stop right away if you feel sudden pain or your pain gets worse. Do not begin these exercises until told by your health care provider. Stretching and range-of-motion exercises These exercises warm up your muscles and joints and improve the movement and flexibility of your back. These exercises also help to relieve pain, numbness, and tingling. Lumbar rotation  Lie on your back on a firm bed or the floor with your knees bent. Straighten your arms out to your sides so each arm forms a  90-degree angle (right angle) with a side of your body. Slowly move (rotate) both of your knees to one side of your body until you feel a stretch in your lower back (lumbar). Try not to let your shoulders lift off the floor. Hold this position for __________ seconds. Tense your abdominal muscles and slowly move your knees back to the starting position. Repeat this exercise on the other side of your body. Repeat __________ times. Complete this exercise __________ times a day. Single knee to chest  Lie on your back on a firm bed or the floor with both legs straight. Bend one of your knees. Use your hands to move your knee up toward your chest until you feel a gentle stretch in your lower back and buttock. Hold your leg in this position by holding on to the front of your knee. Keep your other leg as straight as possible. Hold this position for __________ seconds. Slowly return to the starting position. Repeat with your other leg. Repeat __________ times. Complete this exercise __________ times a day. Prone extension on elbows  Lie on your abdomen on a firm bed or the floor (prone position). Prop yourself up on your elbows. Use your arms to help lift your  chest up until you feel a gentle stretch in your abdomen and your lower back. This will place some of your body weight on your elbows. If this is uncomfortable, try stacking pillows under your chest. Your hips should stay down, against the surface that you are lying on. Keep your hip and back muscles relaxed. Hold this position for __________ seconds. Slowly relax your upper body and return to the starting position. Repeat __________ times. Complete this exercise __________ times a day. Strengthening exercises These exercises build strength and endurance in your back. Endurance is the ability to use your muscles for a long time, even after they get tired. Pelvic tilt This exercise strengthens the muscles that lie deep in the abdomen. Lie on your back on a firm bed or the floor with your legs extended. Bend your knees so they are pointing toward the ceiling and your feet are flat on the floor. Tighten your lower abdominal muscles to press your lower back against the floor. This motion will tilt your pelvis so your tailbone points up toward the ceiling instead of pointing to your feet or the floor. To help with this exercise, you may place a small towel under your lower back and try to push your back into the towel. Hold this position for __________ seconds. Let your muscles relax completely before you repeat this exercise. Repeat __________ times. Complete this exercise __________ times a day. Alternating arm and leg raises  Get on your hands and knees on a firm surface. If you are on a hard floor, you may want to use padding, such as an exercise mat, to cushion your knees. Line up your arms and legs. Your hands should be directly below your shoulders, and your knees should be directly below your hips. Lift your left leg behind you. At the same time, raise your right arm and straighten it in front of you. Do not lift your leg higher than your hip. Do not lift your arm higher than your  shoulder. Keep your abdominal and back muscles tight. Keep your hips facing the ground. Do not arch your back. Keep your balance carefully, and do not hold your breath. Hold this position for __________ seconds. Slowly return to the starting position. Repeat with your  right leg and your left arm. Repeat __________ times. Complete this exercise __________ times a day. Abdominal set with straight leg raise  Lie on your back on a firm bed or the floor. Bend one of your knees and keep your other leg straight. Tense your abdominal muscles and lift your straight leg up, 4-6 inches (10-15 cm) off the ground. Keep your abdominal muscles tight and hold this position for __________ seconds. Do not hold your breath. Do not arch your back. Keep it flat against the ground. Keep your abdominal muscles tense as you slowly lower your leg back to the starting position. Repeat with your other leg. Repeat __________ times. Complete this exercise __________ times a day. Single leg lower with bent knees Lie on your back on a firm bed or the floor. Tense your abdominal muscles and lift your feet off the floor, one foot at a time, so your knees and hips are bent in 90-degree angles (right angles). Your knees should be over your hips and your lower legs should be parallel to the floor. Keeping your abdominal muscles tense and your knee bent, slowly lower one of your legs so your toe touches the ground. Lift your leg back up to return to the starting position. Do not hold your breath. Do not let your back arch. Keep your back flat against the ground. Repeat with your other leg. Repeat __________ times. Complete this exercise __________ times a day. Posture and body mechanics Good posture and healthy body mechanics can help to relieve stress in your body's tissues and joints. Body mechanics refers to the movements and positions of your body while you do your daily activities. Posture is part of body mechanics.  Good posture means: Your spine is in its natural S-curve position (neutral). Your shoulders are pulled back slightly. Your head is not tipped forward (neutral). Follow these guidelines to improve your posture and body mechanics in your everyday activities. Standing  When standing, keep your spine neutral and your feet about hip-width apart. Keep a slight bend in your knees. Your ears, shoulders, and hips should line up. When you do a task in which you stand in one place for a long time, place one foot up on a stable object that is 2-4 inches (5-10 cm) high, such as a footstool. This helps keep your spine neutral. Sitting  When sitting, keep your spine neutral and keep your feet flat on the floor. Use a footrest, if necessary, and keep your thighs parallel to the floor. Avoid rounding your shoulders, and avoid tilting your head forward. When working at a desk or a computer, keep your desk at a height where your hands are slightly lower than your elbows. Slide your chair under your desk so you are close enough to maintain good posture. When working at a computer, place your monitor at a height where you are looking straight ahead and you do not have to tilt your head forward or downward to look at the screen. Resting When lying down and resting, avoid positions that are most painful for you. If you have pain with activities such as sitting, bending, stooping, or squatting, lie in a position in which your body does not bend very much. For example, avoid curling up on your side with your arms and knees near your chest (fetal position). If you have pain with activities such as standing for a long time or reaching with your arms, lie with your spine in a neutral position and bend your  knees slightly. Try the following positions: Lying on your side with a pillow between your knees. Lying on your back with a pillow under your knees. Lifting  When lifting objects, keep your feet at least shoulder-width  apart and tighten your abdominal muscles. Bend your knees and hips and keep your spine neutral. It is important to lift using the strength of your legs, not your back. Do not lock your knees straight out. Always ask for help to lift heavy or awkward objects. This information is not intended to replace advice given to you by your health care provider. Make sure you discuss any questions you have with your health care provider. Document Revised: 01/26/2021 Document Reviewed: 01/26/2021 [employer] Patient Education  2024 [employer].

## 2023-06-22 ENCOUNTER — Encounter (INDEPENDENT_AMBULATORY_CARE_PROVIDER_SITE_OTHER): Payer: Self-pay

## 2023-07-11 ENCOUNTER — Encounter: Payer: Self-pay | Admitting: Internal Medicine

## 2023-07-11 ENCOUNTER — Encounter: Payer: Federal, State, Local not specified - PPO | Admitting: Internal Medicine

## 2023-07-11 ENCOUNTER — Ambulatory Visit: Payer: Federal, State, Local not specified - PPO | Admitting: Internal Medicine

## 2023-07-11 VITALS — BP 118/72 | HR 85 | Temp 98.0°F | Ht 66.0 in | Wt 138.0 lb

## 2023-07-11 DIAGNOSIS — R070 Pain in throat: Secondary | ICD-10-CM

## 2023-07-11 DIAGNOSIS — M25559 Pain in unspecified hip: Secondary | ICD-10-CM | POA: Insufficient documentation

## 2023-07-11 DIAGNOSIS — M25552 Pain in left hip: Secondary | ICD-10-CM

## 2023-07-11 DIAGNOSIS — R053 Chronic cough: Secondary | ICD-10-CM

## 2023-07-11 DIAGNOSIS — Z0000 Encounter for general adult medical examination without abnormal findings: Secondary | ICD-10-CM

## 2023-07-11 DIAGNOSIS — M25551 Pain in right hip: Secondary | ICD-10-CM | POA: Diagnosis not present

## 2023-07-11 DIAGNOSIS — Z85038 Personal history of other malignant neoplasm of large intestine: Secondary | ICD-10-CM

## 2023-07-11 LAB — CBC WITH DIFFERENTIAL/PLATELET
Basophils Absolute: 0 10*3/uL (ref 0.0–0.1)
Basophils Relative: 0.6 % (ref 0.0–3.0)
Eosinophils Absolute: 0.1 10*3/uL (ref 0.0–0.7)
Eosinophils Relative: 1.9 % (ref 0.0–5.0)
HCT: 39.5 % (ref 36.0–46.0)
Hemoglobin: 12.8 g/dL (ref 12.0–15.0)
Lymphocytes Relative: 27.6 % (ref 12.0–46.0)
Lymphs Abs: 1.5 10*3/uL (ref 0.7–4.0)
MCHC: 32.4 g/dL (ref 30.0–36.0)
MCV: 90.4 fl (ref 78.0–100.0)
Monocytes Absolute: 0.5 10*3/uL (ref 0.1–1.0)
Monocytes Relative: 9.4 % (ref 3.0–12.0)
Neutro Abs: 3.2 10*3/uL (ref 1.4–7.7)
Neutrophils Relative %: 60.5 % (ref 43.0–77.0)
Platelets: 234 10*3/uL (ref 150.0–400.0)
RBC: 4.37 Mil/uL (ref 3.87–5.11)
RDW: 12.7 % (ref 11.5–15.5)
WBC: 5.3 10*3/uL (ref 4.0–10.5)

## 2023-07-11 LAB — COMPREHENSIVE METABOLIC PANEL
ALT: 16 U/L (ref 0–35)
AST: 20 U/L (ref 0–37)
Albumin: 4.2 g/dL (ref 3.5–5.2)
Alkaline Phosphatase: 62 U/L (ref 39–117)
BUN: 14 mg/dL (ref 6–23)
CO2: 24 mEq/L (ref 19–32)
Calcium: 9.9 mg/dL (ref 8.4–10.5)
Chloride: 109 mEq/L (ref 96–112)
Creatinine, Ser: 0.65 mg/dL (ref 0.40–1.20)
GFR: 97.03 mL/min (ref >60.00)
Glucose, Bld: 103 mg/dL — ABNORMAL HIGH (ref 70–99)
Potassium: 4 mEq/L (ref 3.5–5.1)
Sodium: 145 mEq/L (ref 135–145)
Total Bilirubin: 0.7 mg/dL (ref 0.2–1.2)
Total Protein: 7.1 g/dL (ref 6.0–8.3)

## 2023-07-11 LAB — LIPID PANEL
Cholesterol: 215 mg/dL — ABNORMAL HIGH (ref 0–200)
HDL: 48.2 mg/dL (ref >39.00)
LDL Cholesterol: 151 mg/dL — ABNORMAL HIGH (ref 0–99)
NonHDL: 167.07
Total CHOL/HDL Ratio: 4
Triglycerides: 80 mg/dL (ref 0.0–149.0)
VLDL: 16 mg/dL (ref 0.0–40.0)

## 2023-07-11 LAB — URINALYSIS
Bilirubin Urine: NEGATIVE
Ketones, ur: NEGATIVE
Leukocytes,Ua: NEGATIVE
Nitrite: NEGATIVE
Specific Gravity, Urine: <=1.005 — AB (ref 1.000–1.030)
Total Protein, Urine: NEGATIVE
Urine Glucose: NEGATIVE
Urobilinogen, UA: 0.2 (ref 0.0–1.0)
pH: 6 (ref 5.0–8.0)

## 2023-07-11 LAB — TSH: TSH: <0.01 u[IU]/mL — ABNORMAL LOW (ref 0.35–5.50)

## 2023-07-11 NOTE — Assessment & Plan Note (Addendum)
Recurrent cough CXR Drink water sips at night Pt denies GERD, allergies ENT visit if not better

## 2023-07-11 NOTE — Assessment & Plan Note (Signed)
Repeat CEA pending Oncology f/u Colon due at 57 yo

## 2023-07-11 NOTE — Assessment & Plan Note (Addendum)
Recurrent Drink water sips at night Pt denies GERD, allergies ENT visit if not better

## 2023-07-11 NOTE — Assessment & Plan Note (Addendum)
  B MSK pain - good ROM, doing yoga RTC if not better

## 2023-07-11 NOTE — Assessment & Plan Note (Signed)
We discussed age appropriate health related issues, including available/recomended screening tests and vaccinations. Labs were ordered to be later reviewed . All questions were answered. We discussed one or more of the following - seat belt use, use of sunscreen/sun exposure exercise, fall risk reduction, second hand smoke exposure, firearm use and storage, seat belt use, a need for adhering to healthy diet and exercise. Needs mammo/PAP, ophth appt - she will schedule Labs were ordered.  All questions were answered. A cardiac CT scan for calcium scoring offered Declined all shots

## 2023-07-11 NOTE — Progress Notes (Signed)
Subjective:  Patient ID: Priscilla Hill, female    DOB: 07-19-1966  Age: 57 y.o. MRN: [id number]  CC: Annual Exam   HPI Priscilla Hill presents for a well exam C/o ST, occasional cough spells x months - rare  Outpatient Medications Prior to Visit  Medication Sig Dispense Refill   b complex vitamins tablet Take 1 tablet by mouth daily. 100 tablet 3   BIOTIN PO Take by mouth daily.     Cholecalciferol (VITAMIN D3) 50 MCG (2000 UT) capsule Take 1 capsule (2,000 Units total) by mouth daily. 100 capsule 3   glucosamine-chondroitin (MAX GLUCOSAMINE CHONDROITIN) 500-400 MG tablet Take 1 tablet by mouth 3 (three) times daily. 100 tablet 3   ibuprofen (ADVIL) 600 MG tablet Take twice a day  prn pain 60 tablet 3   Omega-3 Fatty Acids (OMEGA 3 PO) Take by mouth daily.     omeprazole (PRILOSEC) 40 MG capsule Take 1 capsule (40 mg total) by mouth daily. (Patient taking differently: Take 40 mg by mouth as needed.) 90 capsule 1   TURMERIC PO Take by mouth daily.     No facility-administered medications prior to visit.    ROS: Review of Systems  Constitutional:  Negative for activity change, appetite change, chills, fatigue and unexpected weight change.  HENT:  Negative for congestion, mouth sores and sinus pressure.   Eyes:  Negative for visual disturbance.  Respiratory:  Positive for cough. Negative for chest tightness.   Gastrointestinal:  Negative for abdominal pain and nausea.  Genitourinary:  Negative for difficulty urinating, frequency and vaginal pain.  Musculoskeletal:  Negative for back pain and gait problem.  Skin:  Negative for pallor and rash.  Neurological:  Negative for dizziness, tremors, weakness, numbness and headaches.  Psychiatric/Behavioral:  Negative for confusion and sleep disturbance.     Objective:  BP 118/72 (BP Location: Left Arm, Patient Position: Sitting, Cuff Size: Large)   Pulse 85   Temp 98 F (36.7 C) (Temporal)   Ht 5\' 6"  (1.676 m)   Wt 138 lb (62.6 kg)    LMP  (LMP Unknown)   SpO2 98%   BMI 22.27 kg/m   BP Readings from Last 3 Encounters:  07/11/23 118/72  05/06/23 110/70  04/01/23 116/75    Wt Readings from Last 3 Encounters:  07/11/23 138 lb (62.6 kg)  05/06/23 139 lb 12.8 oz (63.4 kg)  04/01/23 140 lb 12.8 oz (63.9 kg)    Physical Exam Constitutional:      General: She is not in acute distress.    Appearance: She is well-developed.  HENT:     Head: Normocephalic.     Right Ear: External ear normal.     Left Ear: External ear normal.     Nose: Nose normal.  Eyes:     General:        Right eye: No discharge.        Left eye: No discharge.     Conjunctiva/sclera: Conjunctivae normal.     Pupils: Pupils are equal, round, and reactive to light.  Neck:     Thyroid: No thyromegaly.     Vascular: No JVD.     Trachea: No tracheal deviation.  Cardiovascular:     Rate and Rhythm: Normal rate and regular rhythm.     Heart sounds: Normal heart sounds.  Pulmonary:     Effort: No respiratory distress.     Breath sounds: No stridor. No wheezing.  Abdominal:     General:  Bowel sounds are normal. There is no distension.     Palpations: Abdomen is soft. There is no mass.     Tenderness: There is no abdominal tenderness. There is no guarding or rebound.  Musculoskeletal:        General: No tenderness.     Cervical back: Normal range of motion and neck supple. No rigidity.  Lymphadenopathy:     Cervical: No cervical adenopathy.  Skin:    Findings: No erythema or rash.  Neurological:     Cranial Nerves: No cranial nerve deficit.     Motor: No abnormal muscle tone.     Coordination: Coordination normal.     Deep Tendon Reflexes: Reflexes normal.  Psychiatric:        Behavior: Behavior normal.        Thought Content: Thought content normal.        Judgment: Judgment normal.   Thyroid NT - a multiple little enlarged  Lab Results  Component Value Date   WBC 5.2 07/08/2022   HGB 13.8 07/08/2022   HCT 41.7 07/08/2022    PLT 236.0 07/08/2022   GLUCOSE 98 07/08/2022   CHOL 274 (H) 07/08/2022   TRIG 72.0 07/08/2022   HDL 64.20 07/08/2022   LDLCALC 195 (H) 07/08/2022   ALT 16 07/08/2022   AST 19 07/08/2022   NA 143 07/08/2022   K 4.4 07/08/2022   CL 105 07/08/2022   CREATININE 0.75 07/08/2022   BUN 14 07/08/2022   CO2 29 07/08/2022   TSH 1.43 07/08/2022   INR 0.94 05/23/2013    US Abdomen Limited RUQ (LIVER/GB)  Result Date: 12/04/2021 CLINICAL DATA:  Right upper quadrant pain for 2 months EXAM: ULTRASOUND ABDOMEN LIMITED RIGHT UPPER QUADRANT COMPARISON:  CT abdomen pelvis 02/21/2018 FINDINGS: Gallbladder: No gallstones or wall thickening visualized. No sonographic Murphy sign noted by sonographer. Common bile duct: Diameter: 3 mm Liver: No focal lesion identified. Within normal limits in parenchymal echogenicity. Portal vein is patent on color Doppler imaging with normal direction of blood flow towards the liver. Other: None. IMPRESSION: No significant sonographic abnormality of the liver or gallbladder. Electronically Signed   By: Acquanetta Belling M.D.   On: 12/04/2021 10:27    Assessment & Plan:   Problem List Items Addressed This Visit     THROAT PAIN, CHRONIC    Recurrent Drink water sips at night Pt denies GERD, allergies ENT visit if not better      Cough    Recurrent cough CXR Drink water sips at night Pt denies GERD, allergies ENT visit if not better      Relevant Orders   DG Chest 2 View   Well adult exam - Primary    We discussed age appropriate health related issues, including available/recomended screening tests and vaccinations. Labs were ordered to be later reviewed . All questions were answered. We discussed one or more of the following - seat belt use, use of sunscreen/sun exposure exercise, fall risk reduction, second hand smoke exposure, firearm use and storage, seat belt use, a need for adhering to healthy diet and exercise. Needs mammo/PAP, ophth appt - she will  schedule Labs were ordered.  All questions were answered. A cardiac CT scan for calcium scoring offered Declined all shots       Relevant Orders   TSH   Urinalysis   CBC with Differential/Platelet   Lipid panel   Comprehensive metabolic panel   History of colon cancer    Repeat CEA pending Oncology  f/u Colon due at 57 yo      Hip pain     B MSK pain - good ROM, doing yoga RTC if not better         No orders of the defined types were placed in this encounter.     Follow-up: Return in about 1 year (around 07/10/2024) for Wellness Exam.  Sonda Primes, MD

## 2023-07-12 ENCOUNTER — Other Ambulatory Visit: Payer: Self-pay | Admitting: Internal Medicine

## 2023-07-12 ENCOUNTER — Ambulatory Visit (INDEPENDENT_AMBULATORY_CARE_PROVIDER_SITE_OTHER): Payer: Federal, State, Local not specified - PPO

## 2023-07-12 DIAGNOSIS — R7989 Other specified abnormal findings of blood chemistry: Secondary | ICD-10-CM

## 2023-07-12 LAB — T4, FREE: Free T4: 2.07 ng/dL — ABNORMAL HIGH (ref 0.60–1.60)

## 2023-07-12 LAB — T3, FREE: T3, Free: 5.5 pg/mL — ABNORMAL HIGH (ref 2.3–4.2)

## 2023-07-13 ENCOUNTER — Other Ambulatory Visit: Payer: Self-pay | Admitting: Internal Medicine

## 2023-07-13 ENCOUNTER — Encounter: Payer: Self-pay | Admitting: Internal Medicine

## 2023-07-13 DIAGNOSIS — E0590 Thyrotoxicosis, unspecified without thyrotoxic crisis or storm: Secondary | ICD-10-CM

## 2023-07-13 NOTE — Progress Notes (Signed)
Endocrinology ref

## 2023-08-08 ENCOUNTER — Telehealth: Payer: Self-pay

## 2023-08-08 ENCOUNTER — Inpatient Hospital Stay: Payer: Federal, State, Local not specified - PPO | Attending: Oncology

## 2023-08-08 DIAGNOSIS — C187 Malignant neoplasm of sigmoid colon: Secondary | ICD-10-CM | POA: Insufficient documentation

## 2023-08-08 LAB — CEA (ACCESS): CEA (CHCC): <1 ng/mL (ref 0.00–5.00)

## 2023-08-08 NOTE — Telephone Encounter (Signed)
-----   Message from Thornton Papas sent at 08/08/2023  2:50 PM EDT ----- Please call patient to follow-up as scheduled, the CEA is normal, follow-up as scheduled

## 2023-08-08 NOTE — Telephone Encounter (Signed)
Patient gave verbal understanding and had no further questions or concerns  

## 2023-08-23 ENCOUNTER — Ambulatory Visit: Payer: Federal, State, Local not specified - PPO | Admitting: Internal Medicine

## 2023-10-04 ENCOUNTER — Ambulatory Visit: Payer: Federal, State, Local not specified - PPO | Admitting: Endocrinology

## 2023-10-04 ENCOUNTER — Encounter: Payer: Self-pay | Admitting: Endocrinology

## 2023-10-04 VITALS — BP 120/60 | HR 95 | Resp 20 | Ht 66.0 in | Wt 131.2 lb

## 2023-10-04 DIAGNOSIS — E0590 Thyrotoxicosis, unspecified without thyrotoxic crisis or storm: Secondary | ICD-10-CM | POA: Diagnosis not present

## 2023-10-04 LAB — TSH: TSH: <0.01 u[IU]/mL — ABNORMAL LOW (ref 0.35–5.50)

## 2023-10-04 LAB — T3, FREE: T3, Free: 4.9 pg/mL — ABNORMAL HIGH (ref 2.3–4.2)

## 2023-10-04 LAB — T4, FREE: Free T4: 1.9 ng/dL — ABNORMAL HIGH (ref 0.60–1.60)

## 2023-10-04 NOTE — Patient Instructions (Signed)
We will check lab for thyroid including antibody testing. Then will plan next step.   Methimazole Tablets What is this medication? METHIMAZOLE (meth IM a zole) treats high thyroid levels (hyperthyroidism) in your body. It works by decreasing the amount of thyroid hormone your body makes. This medicine may be used for other purposes; ask your health care provider or pharmacist if you have questions. COMMON BRAND NAME(S): Northyx, Tapazole What should I tell my care team before I take this medication? They need to know if you have any of these conditions: Liver disease Low white blood cell levels An unusual or allergic reaction to methimazole, other medications, foods, dyes, or preservatives Pregnant or trying to get pregnant Breastfeeding How should I use this medication? Take this medication by mouth with a glass of water. Follow the directions on the prescription label. You can take this medication with or without food. However, you should always take it the same way to make sure the effects are the same. Take your doses at regular intervals. Do not take your medication more often than directed. Do not stop taking this medication except on the advice of your care team. Talk to your care team about the use of this medication in children. Special care may be needed. While this medication may be prescribed for children for selected conditions, precautions do apply. Overdosage: If you think you have taken too much of this medicine contact a poison control center or emergency room at once. NOTE: This medicine is only for you. Do not share this medicine with others. What if I miss a dose? If you miss a dose, take it as soon as you can. If it is almost time for your next dose, take only that dose. Do not take double or extra doses. What may interact with this medication? Aminophylline Certain medications for blood pressure, heart disease, or irregular heartbeat, such as metoprolol and  propranolol Digoxin Theophylline Warfarin This list may not describe all possible interactions. Give your health care provider a list of all the medicines, herbs, non-prescription drugs, or dietary supplements you use. Also tell them if you smoke, drink alcohol, or use illegal drugs. Some items may interact with your medicine. What should I watch for while using this medication? Visit your care team for regular checks on your progress. Tell your care team if your symptoms do not start to get better or if they get worse. It may be some time before you see the benefit from this medication. You may need blood work done while you are taking this medication. Biotin (vitamin B7) may interfere with your thyroid function test. Stop taking supplements that contain biotin 2 days before your blood work. This medication may increase your risk of getting an infection. Call your care team for advice if you get a fever, chills, sore throat, or other symptoms of a cold or flu. Do not treat yourself. Try to avoid being around people who are sick. Talk to your care team if you may be pregnant. Serious birth defects can occur if you take this medication during pregnancy. If you are going to need surgery or a procedure, tell your care team that you are taking this medication. What side effects may I notice from receiving this medication? Side effects that you should report to your care team as soon as possible: Allergic reactions--skin rash, itching, hives, swelling of the face, lips, tongue, or throat Infection--fever, chills, cough, or sore throat Liver injury--right upper belly pain, loss of appetite, nausea,  light-colored stool, dark yellow or brown urine, yellowing skin or eyes, unusual weakness or fatigue Low thyroid levels (hypothyroidism)--unusual weakness or fatigue, increased sensitivity to cold, constipation, hair loss, dry skin, weight gain, feelings of depression Unusual weakness or fatigue, fever,  headache, skin rash, muscle or joint pain, loss of appetite, pain, tingling, or numbness in the hands or feet Side effects that usually do not require medical attention (report to your care team if they continue or are bothersome): Change in taste Dizziness Hair loss Headache Nausea Upset stomach This list may not describe all possible side effects. Call your doctor for medical advice about side effects. You may report side effects to FDA at 1-800-FDA-1088. Where should I keep my medication? Keep out of the reach of children. Store at room temperature between 20 and 25 degrees C (68 and 77 degrees F). Keep tightly closed. Protect from light. Throw away any unused medication after the expiration date. NOTE: This sheet is a summary. It may not cover all possible information. If you have questions about this medicine, talk to your doctor, pharmacist, or health care provider.  2024 Elsevier/Gold Standard (2023-01-28 00:00:00)

## 2023-10-04 NOTE — Progress Notes (Signed)
Outpatient Endocrinology Note [place] Ahlaya Ende, MD  10/04/23  Patient's Name: Priscilla Hill    DOB: [birth year]    MRN: [id number]  REASON OF VISIT: New consult for hyperthyroidism  REFERRING PROVIDER: Plotnikov, Georgina Quint, MD   PCP: Tresa Garter, MD  HISTORY OF PRESENT ILLNESS:   Priscilla Hill is a 57 y.o. old female with past medical history as listed below is presented for evaluation of hyperthyroidism.   Pertinent Thyroid History: Patient had symptoms of dry cough, throat pain along with not feeling good without symptoms of anxiety, mood change, weight loss, hot flashes, low energy, insomnia which was started around June/July 2024.  She had sore throat, loose bowel movement and hand tremors as well at that time.  She was evaluated by primary care provider in August 2024 and had thyroid function test, on August 19, 20th 2024 with suppressed TSH and elevated free T4 of 2.07 and free T3 of 5.5.  She had normal TSH of 1.43 in August 2023.  Patient is referred to endocrinology for evaluation and management of hyperthyroidism.  -Patient reports she has been doing diet control with gluten-free diet, low-carb and no daily.  Patient reports significant improvement on symptoms except mild neck pain and occasional cough.  She has rare palpitation.  Denies redness or watering of the eyes.  No family history of thyroid disorder.  No history of methimazole use.  No imaging test with IV contrast use.  Labs:   Latest Reference Range & Units 07/08/22 09:11 07/11/23 08:29 07/12/23 14:20  TSH 0.35 - 5.50 uIU/mL 1.43 <0.01 Repeated and verified X2. (L)   Triiodothyronine,Free,Serum 2.3 - 4.2 pg/mL   5.5 (H)  T4,Free(Direct) 0.60 - 1.60 ng/dL   3.55 (H)  (L): Data is abnormally low (H): Data is abnormally high  Interval history 10/04/23 Patient presented for evaluation and management of hyperthyroidism.  She has not been on thyroid medication.  REVIEW OF SYSTEMS:  As per history of present  illness.   PAST MEDICAL HISTORY: Past Medical History:  Diagnosis Date   Anemia    Anxiety    Arthritis    Cancer (HCC)    GERD (gastroesophageal reflux disease)    H. pylori infection    positive IgG serology   History of multiple miscarriages    History of positive PPD    never any symptoms, due to being from [place]? different "vaccine"   Hyperlipidemia    mild    PAST SURGICAL HISTORY: Past Surgical History:  Procedure Laterality Date   childbirth   04-17-13   x 2 NVD   ESSURE TUBAL LIGATION     HYSTEROSCOPY WITH RESECTOSCOPE  04-17-13   "fibroids removed"   LAPAROSCOPIC PARTIAL COLECTOMY N/A 04/24/2013   Procedure: LAPAROSCOPIC ASSISTED SIGMOID COLECTOMY;  Surgeon: Adolph Pollack, MD;  Location: [facility];  Service: General;  Laterality: N/A;   PORTACATH PLACEMENT Right 05/24/2013   Procedure: INSERTION PORT-A-CATH;  Surgeon: Adolph Pollack, MD;  Location: [facility];  Service: General;  Laterality: Right;    ALLERGIES: No Known Allergies  FAMILY HISTORY:  Family History  Problem Relation Age of Onset   Hypertension Mother    Diabetes Mother    Heart disease Mother    Stroke Father 34   Hypertension Father    Heart disease Maternal Grandmother    Heart disease Maternal Grandfather    Stomach cancer Other        grandmother   Healthy Daughter    Healthy  Daughter    Colon cancer Neg Hx     SOCIAL HISTORY: Social History   Socioeconomic History   Marital status: Married    Spouse name: Caryn Bee   Number of children: 2   Years of education: Not on file   Highest education level: Not on file  Occupational History   Occupation: Dietary Event organiser: [email or web address]     Comment: [employer]  Tobacco Use   Smoking status: Never    Passive exposure: Never   Smokeless tobacco: Never  Vaping Use   Vaping status: Never Used  Substance and Sexual Activity   Alcohol use: Yes    Alcohol/week: 0.0 standard drinks of alcohol    Comment: occasionally    Drug use: No   Sexual activity: Yes  Other Topics Concern   Not on file  Social History Narrative   Married, husband Caryn Bee   #1 small 6 yo in home and cares for her 80 year old mother   Both her and husband have #2 grown children each from prior marriage-her's are in [place]   Dietary Production designer, theatre/television/film at [employer] in [place], [place]   Independent in ADL's and able to drive   Speaks English well   Social Determinants of Health   Financial Resource Strain: Not on file  Food Insecurity: Not on file  Transportation Needs: Not on file  Physical Activity: Not on file  Stress: Not on file  Social Connections: Not on file    MEDICATIONS:  Current Outpatient Medications  Medication Sig Dispense Refill   b complex vitamins tablet Take 1 tablet by mouth daily. 100 tablet 3   BIOTIN PO Take by mouth daily.     Cholecalciferol (VITAMIN D3) 50 MCG (2000 UT) capsule Take 1 capsule (2,000 Units total) by mouth daily. 100 capsule 3   glucosamine-chondroitin (MAX GLUCOSAMINE CHONDROITIN) 500-400 MG tablet Take 1 tablet by mouth 3 (three) times daily. 100 tablet 3   ibuprofen (ADVIL) 600 MG tablet Take twice a day  prn pain 60 tablet 3   Omega-3 Fatty Acids (OMEGA 3 PO) Take by mouth daily.     omeprazole (PRILOSEC) 40 MG capsule Take 1 capsule (40 mg total) by mouth daily. (Patient taking differently: Take 40 mg by mouth as needed.) 90 capsule 1   TURMERIC PO Take by mouth daily.     No current facility-administered medications for this visit.    PHYSICAL EXAM: Vitals:   10/04/23 0804  BP: 120/60  Pulse: 95  Resp: 20  SpO2: 98%  Weight: 131 lb 3.2 oz (59.5 kg)  Height: 5\' 6"  (1.676 m)   Body mass index is 21.18 kg/m.  Wt Readings from Last 3 Encounters:  10/04/23 131 lb 3.2 oz (59.5 kg)  07/11/23 138 lb (62.6 kg)  05/06/23 139 lb 12.8 oz (63.4 kg)    General: Well developed, well nourished female in no apparent distress.  HEENT: AT/[place], no external lesions. Hearing intact to  the spoken word Eyes: EOMI. No stare, proptosis or lid lag. Conjunctiva clear and no icterus. No erythema or watering Neck: Trachea midline, neck supple without appreciable thyromegaly or lymphadenopathy and no palpable thyroid nodules Lungs: Clear to auscultation, no wheeze. Respirations not labored Heart: S1S2, Regular in rate and rhythm.  Abdomen: Soft, non tender, non distended Neurologic: Alert, oriented, normal speech, deep tendon biceps reflexes normal,  no gross focal neurological deficit Extremities: No pedal pitting edema, no tremors of outstretched hands Skin: Warm, color  good.  Psychiatric: Does not appear depressed. Some what anxious.  PERTINENT HISTORIC LABORATORY AND IMAGING STUDIES:  All pertinent laboratory results were reviewed. Please see HPI also for further details.   TSH  Date Value Ref Range Status  07/11/2023 <0.01 Repeated and verified X2. (L) 0.35 - 5.50 uIU/mL Final  07/08/2022 1.43 0.35 - 5.50 uIU/mL Final  04/22/2022 1.35 0.40 - 4.50 mIU/L Final    Lab Results  Component Value Date   FREET4 2.07 (H) 07/12/2023   FREET4 0.98 02/26/2015   T3FREE 5.5 (H) 07/12/2023   TSH <0.01 Repeated and verified X2. (L) 07/11/2023   TSH 1.43 07/08/2022   TSH 1.35 04/22/2022    No results found for: "THYROTRECAB"  Lab Results  Component Value Date   TSH <0.01 Repeated and verified X2. (L) 07/11/2023   TSH 1.43 07/08/2022   TSH 1.35 04/22/2022   FREET4 2.07 (H) 07/12/2023   FREET4 0.98 02/26/2015     No results found for: "TSI"   No components found for: "TRAB"    ASSESSMENT / PLAN  1. Hyperthyroidism    -Patient has hyperthyroidism, based on lab in August 2024 and also had symptoms suggestive of hyperthyroidism however has been significantly improving since then. -Etiology of hyperthyroidism unclear at this time.  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   However there are other possibility of autoimmune hyperthyroidism/Graves' disease and hyperfunctioning thyroid nodule. -Improvement on symptoms also consistent with possibility of subacute thyroiditis causing temporary /transient hyperthyroidism, which can sometimes progress to hypothyroidism.  Plan: -I would like to check thyroid function test today. -I would also like to check thyroid autoantibodies including thyroid peroxidase antibody, thyroglobulin antibody for Hashimoto thyroiditis and TRAb and TSI for Graves' disease. -Rest of the plan based on these test results, will consider ultrasound thyroid and nuclear scan if needed based on these test results.   Diagnoses and all orders for this visit:  Hyperthyroidism -     T4, free; Future -     Thyroid peroxidase antibody; Future -     TRAb (TSH Receptor Binding Antibody); Future -     Thyroid stimulating immunoglobulin; Future -     Thyroglobulin antibody; Future -     TSH; Future -     T3, free; Future    DISPOSITION Follow up in clinic in 6 weeks suggested.  All questions answered and patient verbalized understanding of the plan.  [place] Cabell Lazenby, MD [facility] Endocrinology [facility] [address] [place], [address] [place], [place] [address] Phone # [phone]   At least part of this note was generated using voice recognition software. Inadvertent word errors may have occurred, which were not recognized during the proofreading process.

## 2023-10-08 LAB — THYROID STIMULATING IMMUNOGLOBULIN: TSI: 313 %{baseline} — ABNORMAL HIGH (ref <140)

## 2023-10-08 LAB — THYROID PEROXIDASE ANTIBODY: Thyroperoxidase Ab SerPl-aCnc: 71 [IU]/mL — ABNORMAL HIGH (ref <9)

## 2023-10-08 LAB — TRAB (TSH RECEPTOR BINDING ANTIBODY): TRAB: 1.24 IU/L (ref <=2.00)

## 2023-10-08 LAB — THYROGLOBULIN ANTIBODY: Thyroglobulin Ab: 12 [IU]/mL — ABNORMAL HIGH (ref <=1)

## 2023-10-10 ENCOUNTER — Other Ambulatory Visit: Payer: Self-pay

## 2023-10-10 ENCOUNTER — Other Ambulatory Visit: Payer: Self-pay | Admitting: Endocrinology

## 2023-10-10 ENCOUNTER — Telehealth: Payer: Self-pay

## 2023-10-10 DIAGNOSIS — E0590 Thyrotoxicosis, unspecified without thyrotoxic crisis or storm: Secondary | ICD-10-CM

## 2023-10-10 MED ORDER — METHIMAZOLE 5 MG PO TABS: 5.0000 mg | ORAL_TABLET | Freq: Every day | ORAL | 3 refills | Status: DC

## 2023-10-10 NOTE — Telephone Encounter (Signed)
VM left requesting callback to give results of labs and medication addition.

## 2023-10-11 ENCOUNTER — Telehealth: Payer: Self-pay

## 2023-10-11 NOTE — Telephone Encounter (Signed)
-----   Message from [place] Thapa sent at 10/10/2023  8:12 AM EST ----- Please notify patient of labs reviewed she continued to have hypothyroidism with elevated free T4, free T3 and suppressed TSH.  He has positive thyroid autoantibodies including TSI.  It is more likely to be Graves' disease.  I would likely start methimazole 5 mg daily.  Check TSH, free T4, free T3 2 to 3 days prior to follow-up visit with me in January.  I have placed orders.  Please inform the patient and arrange to have lab.

## 2023-10-11 NOTE — Telephone Encounter (Signed)
Patient given results and medication adjustments as directed by MD. No further questions at this time. Patient transferred to front desk to schedule labs.

## 2023-11-18 NOTE — Progress Notes (Signed)
 Office Visit Note  Patient: Priscilla Hill             Date of Birth: [birth year]           MRN: [id number]             PCP: Garald Karlynn LULLA, MD Referring: Garald Karlynn LULLA, MD Visit Date: 12/02/2023 Occupation: @GUAROCC @  Subjective:  Right thumb pain  History of Present Illness: Priscilla Hill is a 57 y.o. female with history of osteoarthritis and positive ANA.  She states for the last 2 months she has been having discomfort in her right thumb.  She has difficulty bending it.  She states her right thumb locks on her at times.  Patient states she tried topical diclofenac  rash but developed a rash on her thumb.  She discontinued the use of diclofenac .  She continues to have some discomfort in her right shoulder.  She had weight loss recently which helped her knee joint pain.  Occasional discomfort in the lower back.  He continues to have dry eyes.  There is no history of oral ulcers, nasal ulcers, malar rash, photosensitivity, Raynaud's or lymphadenopathy.    Activities of Daily Living:  Patient reports morning stiffness for 30 minutes.   Patient Reports nocturnal pain.  Difficulty dressing/grooming: Reports Difficulty climbing stairs: Denies Difficulty getting out of chair: Denies Difficulty using hands for taps, buttons, cutlery, and/or writing: Reports  Review of Systems  Constitutional:  Negative for fatigue.  HENT:  Negative for mouth sores and mouth dryness.   Eyes:  Positive for dryness.  Respiratory:  Negative for shortness of breath.   Cardiovascular:  Negative for chest pain and palpitations.  Gastrointestinal:  Positive for constipation. Negative for blood in stool and diarrhea.  Endocrine: Negative for increased urination.  Genitourinary:  Negative for involuntary urination.  Musculoskeletal:  Positive for joint pain, joint pain and morning stiffness. Negative for gait problem, joint swelling, myalgias, muscle weakness, muscle tenderness and myalgias.  Skin:   Negative for color change, rash, hair loss and sensitivity to sunlight.  Allergic/Immunologic: Negative for susceptible to infections.  Neurological:  Negative for dizziness and headaches.  Hematological:  Negative for swollen glands.  Psychiatric/Behavioral:  Positive for sleep disturbance. Negative for depressed mood. The patient is not nervous/anxious.     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   History of secondary colorectal cancer 04/25/2013   Anemia due to chronic blood loss 04/25/2013   Colon cancer-sigmoid 04/05/2013   Antiphospholipid antibody syndrome (HCC) 02/28/2013   Osteoarthritis, hand 02/28/2013   Well adult exam 11/14/2012   Hematochezia 11/14/2012  Paresthesia 11/14/2012   Dyslipidemia 11/14/2012   URI, acute 05/31/2011   Vaginitis 05/31/2011   Anxiety 04/17/2010   SHOULDER PAIN 04/17/2010   THROAT PAIN, CHRONIC 03/14/2009   Dysphagia 03/14/2009   Conjunctivitis 12/05/2008   Allergic rhinitis 10/08/2008   Nonspecific (abnormal) findings on radiological and other examination of  body structure 10/08/2008   CHEST XRAY, ABNORMAL 10/08/2008   Acute bronchitis 09/24/2008   Cough 09/24/2008    Past Medical History:  Diagnosis Date   Anemia    Anxiety    Arthritis    Cancer (HCC)    GERD (gastroesophageal reflux disease)    H. pylori infection    positive IgG serology   History of multiple miscarriages    History of positive PPD    never any symptoms, due to being from [place]? different vaccine   Hyperlipidemia    mild   Hyperthyroidism     Family History  Problem Relation Age of Onset   Hypertension Mother    Diabetes Mother    Heart disease Mother    Stroke Father 90   Hypertension Father    Heart disease Maternal Grandmother    Heart disease Maternal Grandfather    Stomach cancer Other        grandmother   Healthy Daughter    Healthy Daughter    Colon cancer Neg Hx    Past Surgical History:  Procedure Laterality Date   childbirth   04-17-13   x 2 NVD   ESSURE TUBAL LIGATION     HYSTEROSCOPY WITH RESECTOSCOPE  04-17-13   fibroids removed   LAPAROSCOPIC PARTIAL COLECTOMY N/A 04/24/2013   Procedure: LAPAROSCOPIC ASSISTED SIGMOID COLECTOMY;  Surgeon: Krystal JINNY Russell, MD;  Location: [facility];  Service: General;  Laterality: N/A;   PORTACATH PLACEMENT Right 05/24/2013   Procedure: INSERTION PORT-A-CATH;  Surgeon: Krystal JINNY Russell, MD;  Location: [facility];  Service: General;  Laterality: Right;   Social History   Social History Narrative   Married, husband Franky   #1 small 6 yo in home and cares for her 78 year old mother   Both her and husband have #2 grown children each from prior marriage-her's are in [place]   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  Constitutional:      Appearance: She is well-developed.  HENT:     Head: Normocephalic and atraumatic.  Eyes:     Conjunctiva/sclera: Conjunctivae normal.  Cardiovascular:     Rate and Rhythm: Normal rate and regular rhythm.     Heart sounds: Normal heart sounds.  Pulmonary:     Effort: Pulmonary effort is normal.     Breath sounds: Normal breath sounds.  Abdominal:     General: Bowel sounds are normal.     Palpations: Abdomen is soft.  Musculoskeletal:     Cervical back: Normal range of motion.  Lymphadenopathy:  Cervical: No cervical adenopathy.  Skin:    General: Skin is warm and dry.     Capillary Refill: Capillary refill takes less than 2 seconds.  Neurological:     Mental Status: She is alert and oriented to person, place, and time.  Psychiatric:        Behavior: Behavior normal.      Musculoskeletal Exam: Cervical, thoracic and lumbar spine were in good range of motion.  Shoulder joints, elbow joints, wrist joints, MCPs PIPs and DIPs with good range of motion.  She has some tenderness over right CMC right first MCP and right first PIP joint.  Mild PIP and DIP thickening was noted.  Hip joints and knee joints in good range of motion.  No warmth swelling or effusion was noted.  There was no tenderness over ankles or MTPs.  CDAI Exam: CDAI Score: -- Patient Global: --; Provider Global: -- Swollen: --; Tender: -- Joint Exam 12/02/2023   No joint exam has been documented for this visit   There is currently no information documented on the homunculus. Go to the Rheumatology activity and complete the homunculus joint exam.  Investigation: No additional findings.  Imaging: No results found.  Recent Labs: Lab Results  Component Value Date    WBC 5.3 07/11/2023   HGB 12.8 07/11/2023   PLT 234.0 07/11/2023   NA 145 07/11/2023   K 4.0 07/11/2023   CL 109 07/11/2023   CO2 24 07/11/2023   GLUCOSE 103 (H) 07/11/2023   BUN 14 07/11/2023   CREATININE 0.65 07/11/2023   BILITOT 0.7 07/11/2023   ALKPHOS 62 07/11/2023   AST 20 07/11/2023   ALT 16 07/11/2023   PROT 7.1 07/11/2023   ALBUMIN 4.2 07/11/2023   CALCIUM  9.9 07/11/2023   GFRAA 115 04/23/2021    Speciality Comments: No specialty comments available.  Procedures:  No procedures performed Allergies: Patient has no known allergies.   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  She states recently she has been having pain and discomfort in right thumb.  She describes discomfort over right CMC right first MCP and right first PIP joints.  No synovitis was noted.  She states she tried diclofenac  gel but got an allergic response to it.  She also got a brace which she describes as thumb spica.  I discussed the option of cortisone injection if her symptoms get worse.  I will also refer her to occupational therapy.  A handout on hand exercises was given.  Joint protection was discussed.  Chronic right shoulder pain-she has intermittent discomfort.  She had good range of motion without discomfort today.  Primary osteoarthritis of right knee-she gives history of intermittent discomfort in her knee joint.  No warmth swelling or effusion was noted.  Patient reports improvement in her knee joint pain with weight loss.  Primary osteoarthritis of both feet-she denies any discomfort in her feet.  Lumbar radiculitis-she has off-and-on discomfort.  She had good mobility in her spine today to.  Positive ANA (antinuclear antibody) - ANA 1: 160 nucleolar speckled.  History of sicca symptoms.  Her ANA has been low titer and stable.  Double-stranded DNA was indeterminate.  No clinical features of  autoimmune disease was noted today.  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    Follow-Up Instructions: Return in about 6 months (around 05/31/2024) for Osteoarthritis.   Maya Nash, MD  Note - This record has been created using Animal nutritionist.  Chart creation errors have been sought, but may not always  have been located. Such creation errors do not reflect on  the standard of medical care.

## 2023-12-02 ENCOUNTER — Encounter: Payer: Self-pay | Admitting: Rheumatology

## 2023-12-02 ENCOUNTER — Ambulatory Visit: Payer: 59 | Attending: Rheumatology | Admitting: Rheumatology

## 2023-12-02 VITALS — BP 109/74 | HR 89 | Resp 14 | Ht 66.0 in | Wt 129.0 lb

## 2023-12-02 DIAGNOSIS — M1711 Unilateral primary osteoarthritis, right knee: Secondary | ICD-10-CM

## 2023-12-02 DIAGNOSIS — M19041 Primary osteoarthritis, right hand: Secondary | ICD-10-CM | POA: Diagnosis not present

## 2023-12-02 DIAGNOSIS — B351 Tinea unguium: Secondary | ICD-10-CM

## 2023-12-02 DIAGNOSIS — M19071 Primary osteoarthritis, right ankle and foot: Secondary | ICD-10-CM

## 2023-12-02 DIAGNOSIS — C187 Malignant neoplasm of sigmoid colon: Secondary | ICD-10-CM

## 2023-12-02 DIAGNOSIS — R7689 Other specified abnormal immunological findings in serum: Secondary | ICD-10-CM

## 2023-12-02 DIAGNOSIS — M5416 Radiculopathy, lumbar region: Secondary | ICD-10-CM

## 2023-12-02 DIAGNOSIS — R5383 Other fatigue: Secondary | ICD-10-CM

## 2023-12-02 DIAGNOSIS — M19072 Primary osteoarthritis, left ankle and foot: Secondary | ICD-10-CM

## 2023-12-02 DIAGNOSIS — L659 Nonscarring hair loss, unspecified: Secondary | ICD-10-CM

## 2023-12-02 DIAGNOSIS — Z9289 Personal history of other medical treatment: Secondary | ICD-10-CM

## 2023-12-02 DIAGNOSIS — D500 Iron deficiency anemia secondary to blood loss (chronic): Secondary | ICD-10-CM

## 2023-12-02 DIAGNOSIS — E785 Hyperlipidemia, unspecified: Secondary | ICD-10-CM

## 2023-12-02 DIAGNOSIS — K2970 Gastritis, unspecified, without bleeding: Secondary | ICD-10-CM

## 2023-12-02 DIAGNOSIS — K2990 Gastroduodenitis, unspecified, without bleeding: Secondary | ICD-10-CM

## 2023-12-02 DIAGNOSIS — R768 Other specified abnormal immunological findings in serum: Secondary | ICD-10-CM

## 2023-12-02 DIAGNOSIS — G8929 Other chronic pain: Secondary | ICD-10-CM

## 2023-12-02 DIAGNOSIS — G620 Drug-induced polyneuropathy: Secondary | ICD-10-CM

## 2023-12-02 DIAGNOSIS — E559 Vitamin D deficiency, unspecified: Secondary | ICD-10-CM

## 2023-12-02 DIAGNOSIS — N96 Recurrent pregnancy loss: Secondary | ICD-10-CM

## 2023-12-02 DIAGNOSIS — M25511 Pain in right shoulder: Secondary | ICD-10-CM

## 2023-12-02 DIAGNOSIS — M19042 Primary osteoarthritis, left hand: Secondary | ICD-10-CM

## 2023-12-02 NOTE — Patient Instructions (Signed)

## 2023-12-05 ENCOUNTER — Ambulatory Visit: Payer: 59 | Admitting: Endocrinology

## 2023-12-05 ENCOUNTER — Encounter: Payer: Self-pay | Admitting: Endocrinology

## 2023-12-05 VITALS — BP 110/70 | HR 83 | Ht 66.0 in | Wt 130.0 lb

## 2023-12-05 DIAGNOSIS — E0590 Thyrotoxicosis, unspecified without thyrotoxic crisis or storm: Secondary | ICD-10-CM

## 2023-12-05 NOTE — Progress Notes (Addendum)
 Outpatient Endocrinology Note [place] Lingenfelter, MD  12/05/23  Patient's Name: Priscilla Hill    DOB: 04/26/1966    MRN: [id number]  REASON OF VISIT: Follow up for hyperthyroidism / Graves disease  REFERRING PROVIDER: Garald Karlynn LULLA, MD   PCP: Garald Karlynn LULLA, MD  HISTORY OF PRESENT ILLNESS:   Priscilla Hill is a 58 y.o. old female with past medical history as listed below is presented for follow-up of hyperthyroidism /? Graves' disease.   Pertinent Thyroid  History: Patient had symptoms of dry cough, throat pain along with not feeling good without symptoms of anxiety, mood change, weight loss, hot flashes, low energy, insomnia which was started around June/July 2024.  She had sore throat, loose bowel movement and hand tremors as well at that time.  She was evaluated by primary care provider in August 2024 and had thyroid  function test, on August 19, 20th 2024 with suppressed TSH and elevated free T4 of 2.07 and free T3 of 5.5.  She had normal TSH of 1.43 in August 2023, and referred to endocrinology.  Patient was initially evaluated in November 2024, she continued to have elevated thyroid  hormone levels along with positive TSI, normal TRAB, mildly elevated anti-TPO and thyroglobulin antibody overall possibley consistent with autoimmune hyperthyroidism/Graves' disease.    Patient was planned for methimazole  5 mg daily in November 2024.  Patient did not start.  No family history of thyroid  disorder.   Labs:   Latest Reference Range & Units 10/04/23 08:42  Thyroglobulin Ab < or = 1 IU/mL 12 (H)  Thyroperoxidase Ab SerPl-aCnc <9 IU/mL 71 (H)  THYROID  STIMULATING IMMUNOGLOBULIN  Rpt !  TSI <140 % baseline 313 (H)  (H): Data is abnormally high !: Data is abnormal Rpt: View report in Results Review for more information   Latest Reference Range & Units 10/04/23 08:42  TRAB <=2.00 IU/L 1.24    Interval history  Patient did not start methimazole  as planned in November visit.  She  was waiting if her problem resolves on its own.  She complains of neck discomfort, irritation requiring cough and occasionally voice change.  Rare dysphagia.  Denies palpitation or heat intolerance.  Overall feeling good and normal energy.  No cold intolerance.  No change in bowel habit with diarrhea or constipation.   REVIEW OF SYSTEMS:  As per history of present illness.   PAST MEDICAL HISTORY: Past Medical History:  Diagnosis Date   Anemia    Anxiety    Arthritis    Cancer (HCC)    GERD (gastroesophageal reflux disease)    H. pylori infection    positive IgG serology   History of multiple miscarriages    History of positive PPD    never any symptoms, due to being from [place]? different vaccine   Hyperlipidemia    mild   Hyperthyroidism     PAST SURGICAL HISTORY: Past Surgical History:  Procedure Laterality Date   childbirth   04-17-13   x 2 NVD   ESSURE TUBAL LIGATION     HYSTEROSCOPY WITH RESECTOSCOPE  04-17-13   fibroids removed   LAPAROSCOPIC PARTIAL COLECTOMY N/A 04/24/2013   Procedure: LAPAROSCOPIC ASSISTED SIGMOID COLECTOMY;  Surgeon: Krystal JINNY Russell, MD;  Location: [facility];  Service: General;  Laterality: N/A;   PORTACATH PLACEMENT Right 05/24/2013   Procedure: INSERTION PORT-A-CATH;  Surgeon: Krystal JINNY Russell, MD;  Location: [facility];  Service: General;  Laterality: Right;    ALLERGIES: No Known Allergies  FAMILY HISTORY:  Family History  Problem Relation Age of Onset   Hypertension Mother    Diabetes Mother    Heart disease Mother    Stroke Father 55   Hypertension Father    Heart disease Maternal Grandmother    Heart disease Maternal Grandfather    Stomach cancer Other        grandmother   Healthy Daughter    Healthy Daughter    Colon cancer Neg Hx     SOCIAL HISTORY: Social History   Socioeconomic History   Marital status: Married    Spouse name: Franky   Number of children: 2   Years of education: Not on file   Highest education level: Not  on file  Occupational History   Occupation: Dietary Event Organiser: [email or web address]     Comment: [employer]  Tobacco Use   Smoking status: Never    Passive exposure: Never   Smokeless tobacco: Never  Vaping Use   Vaping status: Never Used  Substance and Sexual Activity   Alcohol use: Yes    Alcohol/week: 0.0 standard drinks of alcohol    Comment: occasionally   Drug use: No   Sexual activity: Yes  Other Topics Concern   Not on file  Social History Narrative   Married, husband Franky   #1 small 6 yo in home and cares for her 22 year old mother   Both her and husband have #2 grown children each from prior marriage-her's are in [place]   Dietary production designer, theatre/television/film at [employer] in [place], [place]   Independent in ADL's and able to drive   Speaks English well   Social Drivers of Health   Financial Resource Strain: Not on file  Food Insecurity: Not on file  Transportation Needs: Not on file  Physical Activity: Not on file  Stress: Not on file  Social Connections: Not on file    MEDICATIONS:  Current Outpatient Medications  Medication Sig Dispense Refill   b complex vitamins tablet Take 1 tablet by mouth daily. 100 tablet 3   Cholecalciferol  (VITAMIN D3) 50 MCG (2000 UT) capsule Take 1 capsule (2,000 Units total) by mouth daily. 100 capsule 3   ibuprofen  (ADVIL ) 600 MG tablet Take twice a day  prn pain 60 tablet 3   Omega-3 Fatty Acids (OMEGA 3 PO) Take by mouth daily.     Probiotic Product (PROBIOTIC PO) Take by mouth.     TURMERIC PO Take by mouth daily.     BIOTIN PO Take by mouth daily. (Patient not taking: Reported on 12/05/2023)     glucosamine-chondroitin (MAX GLUCOSAMINE CHONDROITIN) 500-400 MG tablet Take 1 tablet by mouth 3 (three) times daily. (Patient not taking: Reported on 12/05/2023) 100 tablet 3   methimazole  (TAPAZOLE ) 5 MG tablet Take 1 tablet (5 mg total) by mouth daily. (Patient not taking: Reported on 12/05/2023) 90 tablet 3   omeprazole   (PRILOSEC) 40 MG capsule Take 1 capsule (40 mg total) by mouth daily. (Patient not taking: Reported on 12/05/2023) 90 capsule 1   No current facility-administered medications for this visit.    PHYSICAL EXAM: Vitals:   12/05/23 1532  BP: 110/70  Pulse: 83  SpO2: (!) 83%  Weight: 130 lb (59 kg)  Height: 5' 6 (1.676 m)    Body mass index is 20.98 kg/m.  Wt Readings from Last 3 Encounters:  12/05/23 130 lb (59 kg)  12/02/23 129 lb (58.5 kg)  10/04/23 131 lb 3.2 oz (59.5 kg)    General:  Well developed, well nourished female in no apparent distress.  HEENT: AT/[place], no external lesions. Hearing intact to the spoken word Eyes: EOMI. No stare, proptosis or lid lag. Conjunctiva clear and no icterus. No erythema or watering Neck: Trachea midline, neck supple without appreciable thyromegaly or lymphadenopathy and no palpable thyroid  nodules Lungs: Clear to auscultation, no wheeze. Respirations not labored Heart: S1S2, Regular in rate and rhythm.  Abdomen: Soft, non tender, non distended Neurologic: Alert, oriented, normal speech, deep tendon biceps reflexes normal,  no gross focal neurological deficit Extremities: No pedal pitting edema, no tremors of outstretched hands Skin: Warm, color good.  Psychiatric: Does not appear depressed.Priscilla Hill  PERTINENT HISTORIC LABORATORY AND IMAGING STUDIES:  All pertinent laboratory results were reviewed. Please see HPI also for further details.   TSH  Date Value Ref Range Status  10/04/2023 <0.01 (L) 0.35 - 5.50 uIU/mL Final  07/11/2023 <0.01 Repeated and verified X2. (L) 0.35 - 5.50 uIU/mL Final  07/08/2022 1.43 0.35 - 5.50 uIU/mL Final    Lab Results  Component Value Date   FREET4 1.90 (H) 10/04/2023   FREET4 2.07 (H) 07/12/2023   FREET4 0.98 02/26/2015   T3FREE 4.9 (H) 10/04/2023   T3FREE 5.5 (H) 07/12/2023   TSH <0.01 (L) 10/04/2023   TSH <0.01 Repeated and verified X2. (L) 07/11/2023   TSH 1.43 07/08/2022    No results found for:  THYROTRECAB  Lab Results  Component Value Date   TSH <0.01 (L) 10/04/2023   TSH <0.01 Repeated and verified X2. (L) 07/11/2023   TSH 1.43 07/08/2022   FREET4 1.90 (H) 10/04/2023   FREET4 2.07 (H) 07/12/2023   FREET4 0.98 02/26/2015     Lab Results  Component Value Date   TSI 313 (H) 10/04/2023     No components found for: TRAB    ASSESSMENT / PLAN  1. Hyperthyroidism    -Patient has hyperthyroidism starting in August 2024.  Patient had positive TSI, anti-TPO antibody, thyroglobulin antibody and normal TRAb.  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  Patient was planned to start on methimazole  5 mg daily in the last visit in November.  Patient did not start. -Discussed about compliance with medications.  Discussed about potential side effect of methimazole .  Discussed that if her thyroid  levels are still elevated she needs to take methimazole . -Patient is clinically euthyroid. -She has complains of neck discomfort and occasional dysphagia.  Plan: -Will check thyroid  function test TSH, free T4, free T3. -Will plan for thyroid  medication based on the test results today. -Check ultrasound thyroid  due to having hyperthyroidism and neck related symptoms.   Priscilla Hill was seen today for hyperthyroidism.  Diagnoses and all orders for this visit:  Hyperthyroidism -     T4, free -     T3, free -     TSH -     US  THYROID ; Future   She continues to have elevated thyroid  hormone levels, she needs to take methimazole  5 mg daily.  Will recheck lab in follow-up visit.   Latest Reference Range & Units 12/05/23 16:13  TSH 0.40 - 4.50 mIU/L 0.01 (L)  Triiodothyronine,Free,Serum 2.3 - 4.2 pg/mL 7.9 (H)  T4,Free(Direct) 0.8 - 1.8 ng/dL 2.4 (H)  (L): Data is abnormally low (H): Data is abnormally high  DISPOSITION Follow up in clinic in 2 months suggested.  All questions answered and patient verbalized understanding of the plan.  Monea Pesantez,  MD [facility] Endocrinology [facility] [address] [place], [address] [place],  [place] [address] Phone # [phone]   At least part of this note was generated using voice recognition software. Inadvertent word errors may have occurred, which were not recognized during the proofreading process.

## 2023-12-05 NOTE — Patient Instructions (Signed)
 Lab today.  Ultrasound thyroid planned.

## 2023-12-06 ENCOUNTER — Telehealth: Payer: Self-pay

## 2023-12-06 LAB — T3, FREE: T3, Free: 7.9 pg/mL — ABNORMAL HIGH (ref 2.3–4.2)

## 2023-12-06 LAB — T4, FREE: Free T4: 2.4 ng/dL — ABNORMAL HIGH (ref 0.8–1.8)

## 2023-12-06 LAB — TSH: TSH: 0.01 m[IU]/L — ABNORMAL LOW (ref 0.40–4.50)

## 2023-12-06 MED ORDER — METHIMAZOLE 5 MG PO TABS: 5.0000 mg | ORAL_TABLET | Freq: Every day | ORAL | 3 refills | Status: DC

## 2023-12-06 NOTE — Addendum Note (Signed)
 Addended by: Mylia Pondexter, [place] on: 12/06/2023 03:19 PM   Modules accepted: Orders

## 2023-12-06 NOTE — Telephone Encounter (Signed)
-----   Message from [place] Thapa sent at 12/06/2023  3:19 PM EST ----- Please notify patient of She continues to have elevated thyroid hormone levels, she needs to take methimazole 5 mg daily.  Will recheck lab in follow-up visit.

## 2023-12-06 NOTE — Telephone Encounter (Signed)
 Patient given results and medication changes as directed by MD. No further questions at this time.

## 2023-12-08 ENCOUNTER — Ambulatory Visit
Admission: RE | Admit: 2023-12-08 | Discharge: 2023-12-08 | Disposition: A | Discharge status: Home / Self Care | Payer: 59 | Source: Ambulatory Visit | Attending: Endocrinology | Admitting: Endocrinology

## 2023-12-08 DIAGNOSIS — E0590 Thyrotoxicosis, unspecified without thyrotoxic crisis or storm: Secondary | ICD-10-CM

## 2023-12-13 ENCOUNTER — Encounter: Payer: Self-pay | Admitting: Endocrinology

## 2023-12-14 ENCOUNTER — Telehealth: Payer: Self-pay

## 2023-12-14 NOTE — Telephone Encounter (Signed)
-----   Message from Priscilla Hill sent at 12/13/2023 11:50 AM EST ----- Please notify patient of ultrasound thyroid reviewed overall heterogenous thyroid parenchyma, with ill-defined hypoechoic area likely pseudonodules which means false nodules, can be related with thyroiditis as well.  No biopsy indicated.  Can consider to repeat ultrasound thyroid in the future to monitor.  At this time, will treat for hyperthyroidism with methimazole.

## 2023-12-14 NOTE — Telephone Encounter (Signed)
 Patient given results and medication changes as directed by MD. No further questions at this time.

## 2023-12-19 NOTE — Therapy (Signed)
OUTPATIENT OCCUPATIONAL THERAPY ORTHO EVALUATION  Patient Name: Priscilla Hill MRN: [id number] DOB:May 26, 1966, 58 y.o., female Today's Date: 12/21/2023  PCP: Jacinta Shoe, MD REFERRING PROVIDER:  Pollyann Savoy, MD    END OF SESSION:  OT End of Session - 12/21/23 0801     Visit Number 1    Number of Visits 10    Date for OT Re-Evaluation 02/03/24    Authorization Type [employer]    OT Start Time 0801    OT Stop Time 0847    OT Time Calculation (min) 46 min    Activity Tolerance Patient tolerated treatment well;Patient limited by fatigue;Patient limited by pain;No increased pain    Behavior During Therapy WFL for tasks assessed/performed             Past Medical History:  Diagnosis Date   Anemia    Anxiety    Arthritis    Cancer (HCC)    GERD (gastroesophageal reflux disease)    H. pylori infection    positive IgG serology   History of multiple miscarriages    History of positive PPD    never any symptoms, due to being from [place]? different "vaccine"   Hyperlipidemia    mild   Hyperthyroidism    Past Surgical History:  Procedure Laterality Date   childbirth   04-17-13   x 2 NVD   ESSURE TUBAL LIGATION     HYSTEROSCOPY WITH RESECTOSCOPE  04-17-13   "fibroids removed"   LAPAROSCOPIC PARTIAL COLECTOMY N/A 04/24/2013   Procedure: LAPAROSCOPIC ASSISTED SIGMOID COLECTOMY;  Surgeon: Adolph Pollack, MD;  Location: [facility];  Service: General;  Laterality: N/A;   PORTACATH PLACEMENT Right 05/24/2013   Procedure: INSERTION PORT-A-CATH;  Surgeon: Adolph Pollack, MD;  Location: [facility];  Service: General;  Laterality: Right;   Patient Active Problem List   Diagnosis Date Noted   Hyperthyroidism 07/13/2023   Hip pain 07/11/2023   Microhematuria 07/08/2022   Constipation 11/23/2021   RUQ abdominal pain 11/12/2021   Osteoarthritis 07/03/2020   Insomnia 07/03/2020   Hand pain 01/03/2020   Lumbar radiculitis 06/28/2019   History of colon cancer 08/07/2018   RLQ  abdominal pain 02/13/2018   Otitis externa 01/20/2018   Onychomycosis 01/20/2018   Subluxation of extensor carpi ulnaris tendon, right, initial encounter 03/02/2017   Ulnar neuropathy at wrist, right 10/13/2016   Wrist pain, acute, right 10/13/2016   Dizziness 02/28/2016   Acute sinus infection 11/12/2015   Gastritis and gastroduodenitis 10/13/2015   Alopecia 02/26/2015   Hot flashes 02/26/2015   Low back pain 11/25/2014   Vitamin D deficiency 10/27/2014   H. 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   History of secondary colorectal cancer 04/25/2013   Anemia due to chronic blood loss 04/25/2013   Colon cancer-sigmoid 04/05/2013   Antiphospholipid antibody syndrome (HCC) 02/28/2013   Osteoarthritis, hand 02/28/2013   Well adult exam 11/14/2012   Hematochezia 11/14/2012   Paresthesia 11/14/2012   Dyslipidemia 11/14/2012   URI, acute 05/31/2011   Vaginitis 05/31/2011   Anxiety 04/17/2010   SHOULDER PAIN 04/17/2010   THROAT PAIN, CHRONIC 03/14/2009   Dysphagia 03/14/2009   Conjunctivitis 12/05/2008   Allergic rhinitis 10/08/2008   Nonspecific (abnormal) findings on radiological and other examination of body structure 10/08/2008   CHEST XRAY, ABNORMAL 10/08/2008   Acute bronchitis 09/24/2008   Cough 09/24/2008    ONSET DATE: Chronic since October 2024, and worsening since  REFERRING DIAG: M19.041,M19.042 (ICD-10-CM) -  Primary osteoarthritis of both hands   THERAPY DIAG:  Muscle weakness (generalized)  Other lack of coordination  Pain in right hand  Paresthesia of skin  Pain in joint of left hand  Rationale for Evaluation and Treatment: Rehabilitation  SUBJECTIVE:   SUBJECTIVE STATEMENT: 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  She states that repetitive work tasks hurt her hands and she has tried a prefabricated thumb spica brace.  She is not  wearing it today.  She also states some "shocking" pains through the thumb and wrist at times and a history of "tendinitis" in her right wrist at times though this does not seem to be bothering her today.  She currently has no stretching or exercise regimen to help with her hands.   PERTINENT HISTORY: Per MD note: "She states recently she has been having pain and discomfort in right thumb. She describes discomfort over right CMC right first MCP and right first PIP joints. No synovitis was noted. She states she tried diclofenac gel but got an allergic response to it. She also got a brace which she describes as thumb spica. I discussed the option of cortisone injection if her symptoms get worse. I will also refer her to occupational therapy. "   Hx of colon cancer 10 years ago  PRECAUTIONS: None ; RED FLAGS: None   WEIGHT BEARING RESTRICTIONS: No  PAIN:  Are you having pain? Yes: NPRS scale: 1-2/10 at rest and at work in the past week up to 7-8/10 Pain location: Rt thumb, base of thumb, also numb feeling Pain description: Aching and sometimes shocking Aggravating factors: Weightbearing and repetitive motions Relieving factors: Rest, heat modalities  FALLS: Has patient fallen in last 6 months? No  LIVING ENVIRONMENT: Lives with: lives with their family Lives in: House/apartment Has following equipment at home: Prefabricated thumb spica brace  PLOF: Independent  PATIENT GOALS: Reduce pain and increase function with the right dominant hand  NEXT MD VISIT: As needed    OBJECTIVE: (All objective assessments below are from initial evaluation on: 12/21/23 unless otherwise specified.)   HAND DOMINANCE: Right   ADLs: Overall ADLs: States decreased ability to grab, hold household objects, pain and difficulty to open containers, perform FMS tasks (manipulate fasteners on clothing)   FUNCTIONAL OUTCOME MEASURES: Eval: Quick DASH 70% impairment today  (Higher % Score  =  More Impairment)      UPPER EXTREMITY ROM     Shoulder to Wrist AROM Right eval Left eval  Wrist flexion 74 74  Wrist extension 68 70  Wrist ulnar deviation    Wrist radial deviation    Functional dart thrower's motion (F-DTM) in ulnar flexion 66   F-DTM in radial extension  70   (Blank rows = not tested)   Hand AROM Right eval Left eval  Full Fist Ability (or Gap to Distal Palmar Crease) full full  Thumb Opposition  (Kapandji Scale)  7/10 10/10  Thumb MCP (0-60) 0-54   Thumb IP (0-80) 0-7   Thumb Radial Abduction Span 52*  66*  Thumb Palmar Abduction Span     (Blank rows = not tested)   UPPER EXTREMITY MMT:     MMT Right 12/21/23 Left 12/21/23  thumb flexion 4/5 tender 5/5  thumb extension 4/5 5/5  Wrist flexion 5/5 5/5  Wrist extension 5/5 5/5  Wrist ulnar deviation    Wrist radial deviation    (Blank rows = not tested)  HAND FUNCTION: Eval: Observed weakness in affected Rt hand.  Grip strength Right: 28.6 lbs, Left: 34.8 lbs  Rt tip pinch: 3#; Lt tip pinch 8#  COORDINATION: Eval: Observed coordination impairments with affected Rt hand.  Details will be tested in the next session 9 Hole Peg Test Right: TBD sec, Left: TBD sec (TBD sec is [facility])   SENSATION: Eval:  Light touch intact today, though states diminished in Rt thumb volar, dorsal  EDEMA:   Eval:  Mildly swollen in Rt thumb IP J   COGNITION: Eval: Overall cognitive status: WFL for evaluation today   OBSERVATIONS:   Eval: Interestingly, she is not very tender to the [facility] joint but more so to the IP joint of the right thumb and has obvious deformity.  She states feeling "clicking" but none is palpated at the MCP joint (as would be typical with trigger thumb).  No significant clicking was palpated at the IP joint either.  Due to the mechanism of her injury and the nature of her complaints, she presents as [facility] joint arthritis in the right thumb worse than the left thumb as well as some nerve irritation (radial/median).     TODAY'S TREATMENT:  Post-evaluation treatment:   For safety/self-care and protection of her joints, she was given a list of "do's and don'ts" to help manage thumb arthritis.  This included pictures of good postures at the thumb and bad postures at the hand and thumb.  And included ideas like using a sliding Ziploc versus 1 that needs to be pinched.  Also using scissors versus struggling to open a bag.  We discussed modifying activities instead of doing repetitively painful activities, and also using different types of supportive bracing to support her joints.  Lastly, OT educates her on a home exercise program that includes nonpainful, nonrepetitive stretches as bolded below.  Each 1 was demonstrated to her and she performs back without any pain or complaints.  Exercises - Seated Wrist Flexion Stretch  - 3 x daily - 3 reps - 15 hold - Wrist Prayer Stretch  - 3 x daily - 3 reps - 15 sec hold - Stretch Thumb DOWNWARD  - 3 x daily - 3 reps - 15 sec hold - Thumb Webspace Stretch  - 3 x daily - 3 reps - 15 sec hold - Towel Roll Grip with Forearm in Neutral  - 3 x daily - 5 reps - 10 sec hold   PATIENT EDUCATION: Education details: See tx section above for details  Person educated: Patient Education method: Verbal Instruction, Teach back, Handouts  Education comprehension: States and demonstrates understanding, Additional Education required    HOME EXERCISE PROGRAM: Access Code: [email or web address] URL: [email or web address] Date: 12/21/2023 Prepared by: Fannie Knee    GOALS: Goals reviewed with patient? Yes   SHORT TERM GOALS: (STG required if POC>30 days) Target Date: 12/30/2023  Pt will obtain protective, custom orthotic. Goal status: TBD/PRN  2.  Pt will demo/state understanding of initial HEP to improve pain levels and prerequisite motion. Goal status: INITIAL   LONG TERM GOALS: Target Date: 02/03/2024  Pt will improve functional ability by decreased impairment  per Quick DASH assessment from 70% impairment to 30% impairment or better, for better quality of life. Goal status: INITIAL  2.  Pt will improve grip strength in right dominant hand from painful 28.6 lbs to at least 35 lbs for functional use at home and in IADLs. Goal status: INITIAL  3.  Pt will improve A/ROM in right thumb  total composite flexion from 61 degrees to at least 85 degrees, to have functional motion for tasks like reach and grasp.  Goal status: INITIAL  4.  Pt will improve strength in right thumb flexion/extension from tender 4/5 MMT to at least nontender 4+/5 MMT to have increased functional ability to carry out selfcare and higher-level homecare tasks with less difficulty. Goal status: INITIAL  5.  Pt will improve coordination skills in right dominant hand, as seen by within functional limit score on nine-hole peg testing to have increased functional ability to carry out fine motor tasks (fasteners, etc.) and more complex, coordinated IADLs (meal prep, sports, etc.).  Goal status: INITIAL  6.  Pt will decrease pain at worst from 7-8/10 to 4/10 or better to have better sleep and occupational participation in daily roles. Goal status: INITIAL   ASSESSMENT:  CLINICAL IMPRESSION: Patient is a 58 y.o. female who was seen today for occupational therapy evaluation for bilateral thumb arthritis and pain, right worse than left, with accompanying decreased functional ability.  This presents mostly as CMC joint arthritis with some intermittent paresthesia complaints.  She does not present to be systemically inflamed today, and it seems more habit and work based.  She will benefit from outpatient occupational therapy to decrease symptoms and increase quality of life  PERFORMANCE DEFICITS: in functional skills including ADLs, IADLs, coordination, dexterity, sensation, ROM, strength, pain, fascial restrictions, muscle spasms, flexibility, Fine motor control, body mechanics, endurance,  decreased knowledge of precautions, and UE functional use, cognitive skills including problem solving and safety awareness, and psychosocial skills including coping strategies, environmental adaptation, and habits.   IMPAIRMENTS: are limiting patient from ADLs, IADLs, work, and leisure.   COMORBIDITIES: may have co-morbidities  that affects occupational performance. Patient will benefit from skilled OT to address above impairments and improve overall function.  MODIFICATION OR ASSISTANCE TO COMPLETE EVALUATION: No modification of tasks or assist necessary to complete an evaluation.  OT OCCUPATIONAL PROFILE AND HISTORY: Problem focused assessment: Including review of records relating to presenting problem.  CLINICAL DECISION MAKING: Moderate - several treatment options, min-mod task modification necessary  REHAB POTENTIAL: Excellent  EVALUATION COMPLEXITY: Low      PLAN:  OT FREQUENCY: 1-2x/week  OT DURATION: 6 weeks through 02/03/2024 and up to 10 visits as needed  PLANNED INTERVENTIONS: 97168 OT Re-evaluation, 97535 self care/ADL training, 40981 therapeutic exercise, 97530 therapeutic activity, 97112 neuromuscular re-education, 97140 manual therapy, 97035 ultrasound, 97018 paraffin, 19147 fluidotherapy, 97010 moist heat, 97010 cryotherapy, 97034 contrast bath, 97760 Orthotics management and training, 82956 Splinting (initial encounter), [id number] Subsequent splinting/medication, Dry needling, energy conservation, coping strategies training, patient/family education, and DME and/or AE instructions  RECOMMENDED OTHER SERVICES: None now  CONSULTED AND AGREED WITH PLAN OF CARE: Patient  PLAN FOR NEXT SESSION:   Check initial HEP and recommendations, get initial coordination measures, take sensation manage measures as needed, upgrade to nerve glides as appropriate, use manual therapy and modalities as helpful.   Fannie Knee, OTR/L, CHT 12/21/2023, 1:37 PM

## 2023-12-21 ENCOUNTER — Encounter: Payer: Self-pay | Admitting: Rehabilitative and Restorative Service Providers"

## 2023-12-21 ENCOUNTER — Ambulatory Visit (INDEPENDENT_AMBULATORY_CARE_PROVIDER_SITE_OTHER): Payer: 59 | Admitting: Rehabilitative and Restorative Service Providers"

## 2023-12-21 DIAGNOSIS — R202 Paresthesia of skin: Secondary | ICD-10-CM

## 2023-12-21 DIAGNOSIS — M6281 Muscle weakness (generalized): Secondary | ICD-10-CM | POA: Diagnosis not present

## 2023-12-21 DIAGNOSIS — M79641 Pain in right hand: Secondary | ICD-10-CM

## 2023-12-21 DIAGNOSIS — R278 Other lack of coordination: Secondary | ICD-10-CM | POA: Diagnosis not present

## 2023-12-21 DIAGNOSIS — M25542 Pain in joints of left hand: Secondary | ICD-10-CM

## 2023-12-21 NOTE — Patient Instructions (Signed)

## 2023-12-28 ENCOUNTER — Encounter: Payer: 59 | Admitting: Rehabilitative and Restorative Service Providers"

## 2024-01-02 NOTE — Therapy (Signed)
OUTPATIENT OCCUPATIONAL THERAPY  TREATMENT NOTE  Patient Name: Priscilla Hill MRN: [id number] DOB:[birth year], 58 y.o., female Today's Date: 01/05/2024  PCP: Jacinta Shoe, MD REFERRING PROVIDER:  Pollyann Savoy, MD    END OF SESSION:  OT End of Session - 01/05/24 0802     Visit Number 2    Number of Visits 10    Date for OT Re-Evaluation 02/03/24    Authorization Type [employer]    OT Start Time 0802    OT Stop Time 0846    OT Time Calculation (min) 44 min    Equipment Utilized During Treatment rubber bands    Activity Tolerance Patient tolerated treatment well;Patient limited by fatigue;Patient limited by pain;No increased pain    Behavior During Therapy WFL for tasks assessed/performed              Past Medical History:  Diagnosis Date   Anemia    Anxiety    Arthritis    Cancer (HCC)    GERD (gastroesophageal reflux disease)    H. pylori infection    positive IgG serology   History of multiple miscarriages    History of positive PPD    never any symptoms, due to being from [place]? different "vaccine"   Hyperlipidemia    mild   Hyperthyroidism    Past Surgical History:  Procedure Laterality Date   childbirth   04-17-13   x 2 NVD   ESSURE TUBAL LIGATION     HYSTEROSCOPY WITH RESECTOSCOPE  04-17-13   "fibroids removed"   LAPAROSCOPIC PARTIAL COLECTOMY N/A 04/24/2013   Procedure: LAPAROSCOPIC ASSISTED SIGMOID COLECTOMY;  Surgeon: Adolph Pollack, MD;  Location: [facility];  Service: General;  Laterality: N/A;   PORTACATH PLACEMENT Right 05/24/2013   Procedure: INSERTION PORT-A-CATH;  Surgeon: Adolph Pollack, MD;  Location: [facility];  Service: General;  Laterality: Right;   Patient Active Problem List   Diagnosis Date Noted   Hyperthyroidism 07/13/2023   Hip pain 07/11/2023   Microhematuria 07/08/2022   Constipation 11/23/2021   RUQ abdominal pain 11/12/2021   Osteoarthritis 07/03/2020   Insomnia 07/03/2020   Hand pain 01/03/2020   Lumbar radiculitis  06/28/2019   History of colon cancer 08/07/2018   RLQ abdominal pain 02/13/2018   Otitis externa 01/20/2018   Onychomycosis 01/20/2018   Subluxation of extensor carpi ulnaris tendon, right, initial encounter 03/02/2017   Ulnar neuropathy at wrist, right 10/13/2016   Wrist pain, acute, right 10/13/2016   Dizziness 02/28/2016   Acute sinus infection 11/12/2015   Gastritis and gastroduodenitis 10/13/2015   Alopecia 02/26/2015   Hot flashes 02/26/2015   Low back pain 11/25/2014   Vitamin D deficiency 10/27/2014   H. 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   History of secondary colorectal cancer 04/25/2013   Anemia due to chronic blood loss 04/25/2013   Colon cancer-sigmoid 04/05/2013   Antiphospholipid antibody syndrome (HCC) 02/28/2013   Osteoarthritis, hand 02/28/2013   Well adult exam 11/14/2012   Hematochezia 11/14/2012   Paresthesia 11/14/2012   Dyslipidemia 11/14/2012   URI, acute 05/31/2011   Vaginitis 05/31/2011   Anxiety 04/17/2010   SHOULDER PAIN 04/17/2010   THROAT PAIN, CHRONIC 03/14/2009   Dysphagia 03/14/2009   Conjunctivitis 12/05/2008   Allergic rhinitis 10/08/2008   Nonspecific (abnormal) findings on radiological and other examination of body structure 10/08/2008   CHEST XRAY, ABNORMAL 10/08/2008   Acute bronchitis 09/24/2008   Cough 09/24/2008    ONSET DATE: Chronic  since October 2024, and worsening since  REFERRING DIAG: M19.041,M19.042 (ICD-10-CM) - Primary osteoarthritis of both hands   THERAPY DIAG:  Muscle weakness (generalized)  Other lack of coordination  Pain in right hand  Paresthesia of skin  Pain in joint of left hand  Rationale for Evaluation and Treatment: Rehabilitation  PERTINENT HISTORY: Per MD note: "She states recently she has been having pain and discomfort in right thumb. She describes discomfort over right CMC right first MCP and right first PIP joints. No  synovitis was noted. She states she tried diclofenac gel but got an allergic response to it. She also got a brace which she describes as thumb spica. I discussed the option of cortisone injection if her symptoms get worse. I will also refer her to occupational therapy. 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  She states that repetitive work tasks hurt her hands and she has tried a prefabricated thumb spica brace.  She is not wearing it today.  She also states some "shocking" pains through the thumb and wrist at times and a history of "tendinitis" in her right wrist at times though this does not seem to be bothering her today.  She currently has no stretching or exercise regimen to help with her hands.  PRECAUTIONS: None ; RED FLAGS: None   WEIGHT BEARING RESTRICTIONS: No   SUBJECTIVE:   SUBJECTIVE STATEMENT: She states HEP going well, no pain at rest now.  Still has a popping and locking sensation in the right thumb.   PAIN:  Are you having pain? Yes: NPRS scale: 0/10 at rest and at work in the past week up to 7/10 Pain location: Rt thumb, base of thumb, also numb feeling Pain description: Aching and sometimes shocking Aggravating factors: Weightbearing and repetitive motions Relieving factors: Rest, heat modalities   PATIENT GOALS: Reduce pain and increase function with the right dominant hand  NEXT MD VISIT: As needed    OBJECTIVE: (All objective assessments below are from initial evaluation on: 12/21/23 unless otherwise specified.)   HAND DOMINANCE: Right   ADLs: Overall ADLs: States decreased ability to grab, hold household objects, pain and difficulty to open containers, perform FMS tasks (manipulate fasteners on clothing)   FUNCTIONAL OUTCOME MEASURES: Eval: Quick DASH 70% impairment today  (Higher % Score  =  More Impairment)     UPPER EXTREMITY ROM     Shoulder to Wrist AROM  Right eval Left eval Rt / Lt  01/05/24  Wrist flexion 74 74 67 / 78  Wrist extension 68 70 70 / 68  Wrist ulnar deviation   46 / 44  Wrist radial deviation   24 / 18  Functional dart thrower's motion (F-DTM) in ulnar flexion 66    F-DTM in radial extension  70    (Blank rows = not tested)   Hand AROM Right eval Left eval Rt  Full Fist Ability (or Gap to Distal Palmar Crease) full full   Thumb Opposition  (Kapandji Scale)  7/10 10/10   Thumb MCP (0-60) 0-54  0- 39*  Thumb IP (0-80) 0-7  0-35  Thumb Radial Abduction Span 52*  66*   Thumb Palmar Abduction Span      (Blank rows = not tested)   UPPER EXTREMITY MMT:     MMT Right 12/21/23 Left 12/21/23  thumb flexion 4/5 tender 5/5  thumb extension 4/5  5/5  Wrist flexion 5/5 5/5  Wrist extension 5/5 5/5  Wrist ulnar deviation    Wrist radial deviation    (Blank rows = not tested)  HAND FUNCTION: 01/05/24: Grip strength Right: 29 lbs, Left: 38 lbs  Rt tip pinch: 3#; Lt tip pinch 8#  Eval: Observed weakness in affected Rt hand.  Grip strength Right: 28.6 lbs, Left: 34.8 lbs  Rt tip pinch: 3#; Lt tip pinch 8#  COORDINATION: Eval: Observed coordination impairments with affected Rt hand.  Details will be tested in the next session 9 Hole Peg Test Right: TBD sec, Left: TBD sec (TBD sec is [facility])   SENSATION: Eval:  Light touch intact today, though states diminished in Rt thumb volar, dorsal  EDEMA:   Eval:  Mildly swollen in Rt thumb IP J   OBSERVATIONS:   Eval: Interestingly, she is not very tender to the [facility] joint but more so to the IP joint of the right thumb and has obvious deformity.  She states feeling "clicking" but none is palpated at the MCP joint (as would be typical with trigger thumb).  No significant clicking was palpated at the IP joint either.  Due to the mechanism of her injury and the nature of her complaints, she presents as [facility] joint arthritis in the right thumb worse than the left thumb as well as some  nerve irritation (radial/median).    TODAY'S TREATMENT:  01/05/24: She starts with active range of motion for new measures which shows improvements in the left hand and wrist and mixed progress in the right hand and thumb.  Her home exercises were reviewed and upgraded to include the bolded dynamic thumb stabilization activities as below.  These will help stabilize the [facility] joint and she tolerated these well.  As she continues to have concerning popping and locking with motion at the IP joint of the right thumb, OT is more suspicious for trigger thumb despite MP joint not being very tender to touch.  There are some palpable bands of tight tissues more laterally to the MP joint but do feel tension.  She was educated on trigger thumb management, using a Band-Aid around the IP joint to limit motion there-she was also educated on her HEP and how it applies to trigger thumb.  She should also do a light massage around the MP joint to mobilize these tissues every day.  She should avoid the locking and popping using the Band-Aid but continue passive stretching as these things are not aggressive or painful to her.  She states understanding all directions and that this can take 4 to 6 weeks to fully resolve.  She will return in 4 weeks to see if popping and locking and also thumb arthritis is better, and if she still has some significant problems OT will refer her to an orthopedic doctor they can perhaps give an injection for trigger thumb.   Exercises - Seated Wrist Flexion Stretch  - 3 x daily - 3 reps - 15 hold - Wrist Prayer Stretch  - 3 x daily - 3 reps - 15 sec hold - Stretch Thumb DOWNWARD  - 3 x daily - 3 reps - 15 sec hold - Thumb Webspace Stretch  - 3 x daily - 3 reps - 15 sec hold - Spread Index Finger Apart  - 3 x daily - 4-5 x weekly - 5 reps - 5 sec hold - C-Strength (try using rubber band)   - 3 x daily - 4-5 x weekly -  5 reps - 5 sec hold    PATIENT EDUCATION: Education details: See tx section  above for details  Person educated: Patient Education method: Verbal Instruction, Teach back, Handouts  Education comprehension: States and demonstrates understanding, Additional Education required    HOME EXERCISE PROGRAM: Access Code: [email or web address] URL: [email or web address] Date: 12/21/2023 Prepared by: Fannie Knee    GOALS: Goals reviewed with patient? Yes   SHORT TERM GOALS: (STG required if POC>30 days) Target Date: 12/30/2023  Pt will obtain protective, custom orthotic. Goal status: TBD/PRN  2.  Pt will demo/state understanding of initial HEP to improve pain levels and prerequisite motion. Goal status: 01/05/2024: Met   LONG TERM GOALS: Target Date: 02/03/2024  Pt will improve functional ability by decreased impairment per Quick DASH assessment from 70% impairment to 30% impairment or better, for better quality of life. Goal status: INITIAL  2.  Pt will improve grip strength in right dominant hand from painful 28.6 lbs to at least 35 lbs for functional use at home and in IADLs. Goal status: INITIAL  3.  Pt will improve A/ROM in right thumb total composite flexion from 61 degrees to at least 85 degrees, to have functional motion for tasks like reach and grasp.  Goal status: INITIAL  4.  Pt will improve strength in right thumb flexion/extension from tender 4/5 MMT to at least nontender 4+/5 MMT to have increased functional ability to carry out selfcare and higher-level homecare tasks with less difficulty. Goal status: INITIAL  5.  Pt will improve coordination skills in right dominant hand, as seen by within functional limit score on nine-hole peg testing to have increased functional ability to carry out fine motor tasks (fasteners, etc.) and more complex, coordinated IADLs (meal prep, sports, etc.).  Goal status: INITIAL  6.  Pt will decrease pain at worst from 7-8/10 to 4/10 or better to have better sleep and occupational participation in daily  roles. Goal status: INITIAL   ASSESSMENT:  CLINICAL IMPRESSION: 01/04/23: Today her arthritis symptoms are better and her pain is generally better.  She did present more like a trigger thumb today in the right hand with some palpable popping and locking with IP joint motion.  This was addressed, and she will self manage this issue for 4 weeks returning to see if she is doing better.  We will consider a consult for injection for trigger thumb after 4 weeks if symptoms are not significantly improved.   PLAN:  OT FREQUENCY: 1-2x/week  OT DURATION: 6 weeks through 02/03/2024 and up to 10 visits as needed  PLANNED INTERVENTIONS: 97168 OT Re-evaluation, 97535 self care/ADL training, 14782 therapeutic exercise, 97530 therapeutic activity, 97112 neuromuscular re-education, 97140 manual therapy, 97035 ultrasound, 97018 paraffin, 95621 fluidotherapy, 97010 moist heat, 97010 cryotherapy, 97034 contrast bath, 97760 Orthotics management and training, 30865 Splinting (initial encounter), [id number] Subsequent splinting/medication, Dry needling, energy conservation, coping strategies training, patient/family education, and DME and/or AE instructions  CONSULTED AND AGREED WITH PLAN OF CARE: Patient  PLAN FOR NEXT SESSION:   Follow-up in about 4 weeks to determine if a consult for injection is necessary for trigger thumb.  Arthritis should be managed better in another 4 weeks.   Fannie Knee, OTR/L, CHT 01/05/2024, 10:14 AM

## 2024-01-05 ENCOUNTER — Ambulatory Visit (INDEPENDENT_AMBULATORY_CARE_PROVIDER_SITE_OTHER): Payer: 59 | Admitting: Rehabilitative and Restorative Service Providers"

## 2024-01-05 ENCOUNTER — Encounter: Payer: Self-pay | Admitting: Rehabilitative and Restorative Service Providers"

## 2024-01-05 DIAGNOSIS — R278 Other lack of coordination: Secondary | ICD-10-CM | POA: Diagnosis not present

## 2024-01-05 DIAGNOSIS — M6281 Muscle weakness (generalized): Secondary | ICD-10-CM

## 2024-01-05 DIAGNOSIS — R202 Paresthesia of skin: Secondary | ICD-10-CM

## 2024-01-05 DIAGNOSIS — M79641 Pain in right hand: Secondary | ICD-10-CM

## 2024-01-05 DIAGNOSIS — M25542 Pain in joints of left hand: Secondary | ICD-10-CM

## 2024-01-11 ENCOUNTER — Encounter: Payer: 59 | Admitting: Rehabilitative and Restorative Service Providers"

## 2024-01-18 ENCOUNTER — Encounter: Payer: 59 | Admitting: Rehabilitative and Restorative Service Providers"

## 2024-01-25 ENCOUNTER — Encounter: Payer: 59 | Admitting: Rehabilitative and Restorative Service Providers"

## 2024-02-01 NOTE — Therapy (Signed)
 OUTPATIENT OCCUPATIONAL THERAPY TREATMENT &  DISCHARGE   NOTE  Patient Name: Priscilla Hill MRN: [id number] DOB:1966/04/24, 58 y.o., female Today's Date: 02/03/2024  PCP: Jacinta Shoe, MD REFERRING PROVIDER:  Pollyann Savoy, MD         OCCUPATIONAL THERAPY DISCHARGE SUMMARY  Visits from Start of Care: 3  Current functional level related to goals / functional outcomes: 02/03/24: She has met half of her long-term goals, but still has some triggering and discomfort in the right thumb.  No significant plaints in the left thumb now and no paresthesia complaints now.  OT feels like therapy has been maximized at this point and she was given options and referral for possible injection or surgery to the right thumb trigger thumb if needed and if she cannot manage this conservatively in the future.  She is somewhat happy with her status as it is much improved from baseline.  Education / Equipment: Pt has all needed materials and education. Pt understands how to continue on with self-management. See tx notes for more details.   Patient agrees to discharge due to max benefits received from outpatient occupational therapy / hand therapy at this time.   Fannie Knee, OTR/L, CHT 02/03/24       END OF SESSION:  OT End of Session - 02/03/24 0807     Visit Number 3    Number of Visits 10    Date for OT Re-Evaluation 02/03/24    Authorization Type [employer]    OT Start Time 0807    OT Stop Time 0838    OT Time Calculation (min) 31 min    Equipment Utilized During Treatment --    Activity Tolerance Patient tolerated treatment well;Patient limited by fatigue;Patient limited by pain;No increased pain    Behavior During Therapy WFL for tasks assessed/performed             Past Medical History:  Diagnosis Date   Anemia    Anxiety    Arthritis    Cancer (HCC)    GERD (gastroesophageal reflux disease)    H. pylori infection    positive IgG serology   History of multiple  miscarriages    History of positive PPD    never any symptoms, due to being from [place]? different "vaccine"   Hyperlipidemia    mild   Hyperthyroidism    Past Surgical History:  Procedure Laterality Date   childbirth   04-17-13   x 2 NVD   ESSURE TUBAL LIGATION     HYSTEROSCOPY WITH RESECTOSCOPE  04-17-13   "fibroids removed"   LAPAROSCOPIC PARTIAL COLECTOMY N/A 04/24/2013   Procedure: LAPAROSCOPIC ASSISTED SIGMOID COLECTOMY;  Surgeon: Adolph Pollack, MD;  Location: [facility];  Service: General;  Laterality: N/A;   PORTACATH PLACEMENT Right 05/24/2013   Procedure: INSERTION PORT-A-CATH;  Surgeon: Adolph Pollack, MD;  Location: [facility];  Service: General;  Laterality: Right;   Patient Active Problem List   Diagnosis Date Noted   Hyperthyroidism 07/13/2023   Hip pain 07/11/2023   Microhematuria 07/08/2022   Constipation 11/23/2021   RUQ abdominal pain 11/12/2021   Osteoarthritis 07/03/2020   Insomnia 07/03/2020   Hand pain 01/03/2020   Lumbar radiculitis 06/28/2019   History of colon cancer 08/07/2018   RLQ abdominal pain 02/13/2018   Otitis externa 01/20/2018   Onychomycosis 01/20/2018   Subluxation of extensor carpi ulnaris tendon, right, initial encounter 03/02/2017   Ulnar neuropathy at wrist, right 10/13/2016   Wrist pain, acute, right 10/13/2016  Dizziness 02/28/2016   Acute sinus infection 11/12/2015   Gastritis and gastroduodenitis 10/13/2015   Alopecia 02/26/2015   Hot flashes 02/26/2015   Low back pain 11/25/2014   Vitamin D deficiency 10/27/2014   H. 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   History of secondary colorectal cancer 04/25/2013   Anemia due to chronic blood loss 04/25/2013   Colon cancer-sigmoid 04/05/2013   Antiphospholipid antibody syndrome (HCC) 02/28/2013   Osteoarthritis, hand 02/28/2013   Well adult exam 11/14/2012   Hematochezia 11/14/2012   Paresthesia 11/14/2012    Dyslipidemia 11/14/2012   URI, acute 05/31/2011   Vaginitis 05/31/2011   Anxiety 04/17/2010   SHOULDER PAIN 04/17/2010   THROAT PAIN, CHRONIC 03/14/2009   Dysphagia 03/14/2009   Conjunctivitis 12/05/2008   Allergic rhinitis 10/08/2008   Nonspecific (abnormal) findings on radiological and other examination of body structure 10/08/2008   CHEST XRAY, ABNORMAL 10/08/2008   Acute bronchitis 09/24/2008   Cough 09/24/2008    ONSET DATE: Chronic since October 2024, and worsening since  REFERRING DIAG: M19.041,M19.042 (ICD-10-CM) - Primary osteoarthritis of both hands   THERAPY DIAG:  Muscle weakness (generalized)  Other lack of coordination  Pain in right hand  Paresthesia of skin  Pain in joint of left hand  Rationale for Evaluation and Treatment: Rehabilitation  PERTINENT HISTORY: Per MD note: "She states recently she has been having pain and discomfort in right thumb. She describes discomfort over right CMC right first MCP and right first PIP joints. No synovitis was noted. She states she tried diclofenac gel but got an allergic response to it. She also got a brace which she describes as thumb spica. I discussed the option of cortisone injection if her symptoms get worse. I will also refer her to occupational therapy. 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  She states that repetitive work tasks hurt her hands and she has tried a prefabricated thumb spica brace.  She is not wearing it today.  She also states some "shocking" pains through the thumb and wrist at times and a history of "tendinitis" in her right wrist at times though this does not seem to be bothering her today.  She currently has no stretching or exercise regimen to help with her hands.  PRECAUTIONS: None ; RED FLAGS: None   WEIGHT BEARING RESTRICTIONS: No   SUBJECTIVE:   SUBJECTIVE STATEMENT: She has been doing a month of  self-management. She returns and states still some triggering but much better symptoms now.  She understands her HEP and management strategies.  PAIN:  Are you having pain? Yes: NPRS scale: 1-2/10 at rest and at work in the past week up to  "moderate" pain Pain location: Rt thumb, base of thumb, also numb feeling Pain description: Aching and sometimes shocking Aggravating factors: Weightbearing and repetitive motions Relieving factors: Rest, heat modalities   PATIENT GOALS: Reduce pain and increase function with the right dominant hand  NEXT MD VISIT: As needed    OBJECTIVE: (All objective assessments below are from initial evaluation on: 12/21/23 unless otherwise specified.)   HAND DOMINANCE: Right   ADLs: Overall ADLs: States decreased ability to grab, hold household objects, pain and difficulty to open containers, perform FMS tasks (manipulate fasteners on clothing)   FUNCTIONAL OUTCOME MEASURES: 02/03/24: 31.8% problems   Eval: Quick DASH 70% impairment today  (  Higher % Score  =  More Impairment)     UPPER EXTREMITY ROM     Shoulder to Wrist AROM Right eval Left eval Rt / Lt  01/05/24 Rt/ Lt 02/03/24  Wrist flexion 74 74 67 / 78 78  /  76  Wrist extension 68 70 70 / 68 76  / 76   Wrist ulnar deviation   46 / 44 39   /  36  Wrist radial deviation   24 / 18 15   /  19   Functional dart thrower's motion (F-DTM) in ulnar flexion 66     F-DTM in radial extension  70     (Blank rows = not tested)   Hand AROM Right eval Left eval Rt 01/04/24 Rt 02/03/24  Full Fist Ability (or Gap to Distal Palmar Crease) full full    Thumb Opposition  (Kapandji Scale)  7/10 10/10    Thumb MCP (0-60) 0-54  0- 39* 0 - 52  Thumb IP (0-80) 0-7  0-35 0 - 20  Thumb Radial Abduction Span 52*  66*    Thumb Palmar Abduction Span       (Blank rows = not tested)   UPPER EXTREMITY MMT:     MMT Right 12/21/23 Left 12/21/23  thumb flexion 4/5 tender 5/5  thumb extension 4/5 5/5  Wrist  flexion 5/5 5/5  Wrist extension 5/5 5/5  Wrist ulnar deviation    Wrist radial deviation    (Blank rows = not tested)  HAND FUNCTION: 02/03/24: Grip strength Right: 37 lbs, Left: 34 lbs  Rt tip pinch: 7#; Lt tip pinch 9#  01/05/24: Grip strength Right: 29 lbs, Left: 38 lbs  Rt tip pinch: 3#; Lt tip pinch 8#  Eval: Observed weakness in affected Rt hand.  Grip strength Right: 28.6 lbs, Left: 34.8 lbs  Rt tip pinch: 3#; Lt tip pinch 8#  OBSERVATIONS:   02/03/24:   now not significantly TTP, though slight.  Still triggering with IP J motion, but band-aide and modifications do prevent this.    TODAY'S TREATMENT:  02/03/24: Pt performs AROM, gripping, and strength with bil hands/thumbs against therapist's resistance for exercise/activities as well as new measures today. OT also discusses home and functional tasks with the pt and reviews goals. Using the complied data, OT also reviews home exercises and provides final recommendations as below. Pt states understanding and tolerates upgrades well.    Exercises reviewed today  - Seated Wrist Flexion Stretch  - 3 x daily - 3 reps - 15 hold - Wrist Prayer Stretch  - 3 x daily - 3 reps - 15 sec hold - Stretch Thumb DOWNWARD  - 3 x daily - 3 reps - 15 sec hold - Thumb Webspace Stretch  - 3 x daily - 3 reps - 15 sec hold - Spread Index Finger Apart  - 3 x daily - 4-5 x weekly - 5 reps - 5 sec hold - C-Strength (try using rubber band)   - 3 x daily - 4-5 x weekly - 5 reps - 5 sec hold    PATIENT EDUCATION: Education details: See tx section above for details  Person educated: Patient Education method: Verbal Instruction, Teach back, Handouts  Education comprehension: States and demonstrates understanding, Additional Education required    HOME EXERCISE PROGRAM: Access Code: [email or web address] URL: [email or web address] Date: 12/21/2023 Prepared by: Fannie Knee    GOALS: Goals reviewed with patient? Yes   SHORT TERM GOALS: (STG  required if POC>30 days)  Target Date: 12/30/2023  Pt will obtain protective, custom orthotic. Goal status: 02/03/24: NA/ DC  2.  Pt will demo/state understanding of initial HEP to improve pain levels and prerequisite motion. Goal status: 01/05/2024: Met   LONG TERM GOALS: Target Date: 02/03/2024  Pt will improve functional ability by decreased impairment per Quick DASH assessment from 70% impairment to 30% impairment or better, for better quality of life. Goal status: 02/03/24: Considered met- now 31%   2.  Pt will improve grip strength in right dominant hand from painful 28.6 lbs to at least 35 lbs for functional use at home and in IADLs. Goal status: 02/03/24: MET  3.  Pt will improve A/ROM in right thumb total composite flexion from 61 degrees to at least 85 degrees, to have functional motion for tasks like reach and grasp.  Goal status: 02/03/24: Not met, but improved now 72*   4.  Pt will improve strength in right thumb flexion/extension from tender 4/5 MMT to at least nontender 4+/5 MMT to have increased functional ability to carry out selfcare and higher-level homecare tasks with less difficulty. Goal status: 02/03/24: NT DC today but better pinch strength now   5.  Pt will improve coordination skills in right dominant hand, as seen by within functional limit score on nine-hole peg testing to have increased functional ability to carry out fine motor tasks (fasteners, etc.) and more complex, coordinated IADLs (meal prep, sports, etc.).  Goal status: 02/03/24: NT but no significant FMS complaints today   6.  Pt will decrease pain at worst from 7-8/10 to 4/10 or better to have better sleep and occupational participation in daily roles. Goal status: 02/03/24: partially met pain down from "severe" to "moderate" in past week    ASSESSMENT:  CLINICAL IMPRESSION: 02/03/24: She has met half of her long-term goals, but still has some triggering and discomfort in the right thumb.  No significant  plaints in the left thumb now and no paresthesia complaints now.  OT feels like therapy has been maximized at this point and she was given options and referral for possible injection or surgery to the right thumb trigger thumb if needed and if she cannot manage this conservatively in the future.  She is somewhat happy with her status as it is much improved from baseline.  PLAN:  OT FREQUENCY: D/C  OT DURATION: D/C  PLANNED INTERVENTIONS: 97168 OT Re-evaluation, 97535 self care/ADL training, 13086 therapeutic exercise, 97530 therapeutic activity, 97112 neuromuscular re-education, 97140 manual therapy, 97035 ultrasound, 97018 paraffin, 57846 fluidotherapy, 97010 moist heat, 97010 cryotherapy, 97034 contrast bath, 97760 Orthotics management and training, 96295 Splinting (initial encounter), [id number] Subsequent splinting/medication, Dry needling, energy conservation, coping strategies training, patient/family education, and DME and/or AE instructions  CONSULTED AND AGREED WITH PLAN OF CARE: Patient  PLAN FOR NEXT SESSION:   D/C   Fannie Knee, OTR/L, CHT 02/03/2024, 8:47 AM

## 2024-02-02 ENCOUNTER — Ambulatory Visit: Payer: 59 | Admitting: Endocrinology

## 2024-02-02 ENCOUNTER — Encounter: Payer: Self-pay | Admitting: Endocrinology

## 2024-02-02 VITALS — BP 100/60 | HR 76 | Resp 20 | Ht 66.0 in | Wt 130.4 lb

## 2024-02-02 DIAGNOSIS — E0590 Thyrotoxicosis, unspecified without thyrotoxic crisis or storm: Secondary | ICD-10-CM | POA: Diagnosis not present

## 2024-02-02 DIAGNOSIS — E0500 Thyrotoxicosis with diffuse goiter without thyrotoxic crisis or storm: Secondary | ICD-10-CM

## 2024-02-02 DIAGNOSIS — E041 Nontoxic single thyroid nodule: Secondary | ICD-10-CM | POA: Diagnosis not present

## 2024-02-02 LAB — T3, FREE: T3, Free: 3.2 pg/mL (ref 2.3–4.2)

## 2024-02-02 LAB — T4, FREE: Free T4: 1.1 ng/dL (ref 0.8–1.8)

## 2024-02-02 LAB — TSH: TSH: 0.01 m[IU]/L — ABNORMAL LOW (ref 0.40–4.50)

## 2024-02-02 NOTE — Addendum Note (Signed)
 Addended by: Lene Mckay, [place] on: 02/02/2024 11:42 PM   Modules accepted: Orders

## 2024-02-02 NOTE — Progress Notes (Signed)
 Outpatient Endocrinology Note Priscilla Angelina Venard, MD  02/02/24  Patient's Name: Priscilla Hill    DOB: [birth year]    MRN: [id number]  REASON OF VISIT: Follow up for hyperthyroidism / Graves disease  REFERRING PROVIDER: Tresa Garter, MD   PCP: Tresa Garter, MD  HISTORY OF PRESENT ILLNESS:   Priscilla Hill is a 58 y.o. old female with past medical history as listed below is presented for follow-up of hyperthyroidism / Graves' disease.   Pertinent Thyroid History: Patient had symptoms of dry cough, throat pain along with not feeling good without symptoms of anxiety, mood change, weight loss, hot flashes, low energy, insomnia which was started around June/July 2024.  She had sore throat, loose bowel movement and hand tremors as well at that time.  She was evaluated by primary care provider in August 2024 and had thyroid function test, on August 19, 20th 2024 with suppressed TSH and elevated free T4 of 2.07 and free T3 of 5.5.  She had normal TSH of 1.43 in August 2023, and referred to endocrinology.  Patient was initially evaluated in November 2024, she continued to have elevated thyroid hormone levels along with positive TSI, normal TRAB, mildly elevated anti-TPO and thyroglobulin antibody overall possibley consistent with autoimmune hyperthyroidism/Graves' disease.    Patient was planned for methimazole 5 mg daily in November 2024.  Patient did not start.  Patient continued to have elevated thyroid hormone levels consistent with hyperthyroidism, worsening and methimazole 5 mg daily was restarted in January 2025.  No family history of thyroid disorder.   Labs:   Latest Reference Range & Units 10/04/23 08:42  Thyroglobulin Ab < or = 1 IU/mL 12 (H)  Thyroperoxidase Ab SerPl-aCnc <9 IU/mL 71 (H)  THYROID STIMULATING IMMUNOGLOBULIN  Rpt !  TSI <140 % baseline 313 (H)  (H): Data is abnormally high !: Data is abnormal Rpt: View report in Results Review for more information    Latest Reference Range & Units 10/04/23 08:42  TRAB <=2.00 IU/L 1.24    # General 16, 2025 ultrasound thyroid : overall heterogenous thyroid parenchyma, with ill-defined hypoechoic area likely pseudonodules.    Interval history  Patient has been taking methimazole 5 mg daily.  She reports significant better on the neck symptoms.  No more neck discomfort.  States sometimes get dry eyes.  Denies palpitation or heat intolerance.  No constipation or cold intolerance.  No change in bowel habit.  Overall feeling good.  No recent illness.  No stomach issues.  No other complaints today.  REVIEW OF SYSTEMS:  As per history of present illness.   PAST MEDICAL HISTORY: Past Medical History:  Diagnosis Date   Anemia    Anxiety    Arthritis    Cancer (HCC)    GERD (gastroesophageal reflux disease)    H. pylori infection    positive IgG serology   History of multiple miscarriages    History of positive PPD    never any symptoms, due to being from [place]? different "vaccine"   Hyperlipidemia    mild   Hyperthyroidism     PAST SURGICAL HISTORY: Past Surgical History:  Procedure Laterality Date   childbirth   04-17-13   x 2 NVD   ESSURE TUBAL LIGATION     HYSTEROSCOPY WITH RESECTOSCOPE  04-17-13   "fibroids removed"   LAPAROSCOPIC PARTIAL COLECTOMY N/A 04/24/2013   Procedure: LAPAROSCOPIC ASSISTED SIGMOID COLECTOMY;  Surgeon: Adolph Pollack, MD;  Location: [facility];  Service: General;  Laterality:  N/A;   PORTACATH PLACEMENT Right 05/24/2013   Procedure: INSERTION PORT-A-CATH;  Surgeon: Adolph Pollack, MD;  Location: [facility];  Service: General;  Laterality: Right;    ALLERGIES: No Known Allergies  FAMILY HISTORY:  Family History  Problem Relation Age of Onset   Hypertension Mother    Diabetes Mother    Heart disease Mother    Stroke Father 59   Hypertension Father    Heart disease Maternal Grandmother    Heart disease Maternal Grandfather    Stomach cancer Other        grandmother    Healthy Daughter    Healthy Daughter    Colon cancer Neg Hx     SOCIAL HISTORY: Social History   Socioeconomic History   Marital status: Married    Spouse name: Caryn Bee   Number of children: 2   Years of education: Not on file   Highest education level: Not on file  Occupational History   Occupation: Dietary Event organiser: [email or web address]     Comment: [employer]  Tobacco Use   Smoking status: Never    Passive exposure: Never   Smokeless tobacco: Never  Vaping Use   Vaping status: Never Used  Substance and Sexual Activity   Alcohol use: Yes    Alcohol/week: 0.0 standard drinks of alcohol    Comment: occasionally   Drug use: No   Sexual activity: Yes  Other Topics Concern   Not on file  Social History Narrative   Married, husband Caryn Bee   #1 small 6 yo in home and cares for her 17 year old mother   Both her and husband have #2 grown children each from prior marriage-her's are in [place]   Dietary Production designer, theatre/television/film at [employer] in [place], [place]   Independent in ADL's and able to drive   Speaks English well   Social Drivers of Health   Financial Resource Strain: Not on file  Food Insecurity: Not on file  Transportation Needs: Not on file  Physical Activity: Not on file  Stress: Not on file  Social Connections: Not on file    MEDICATIONS:  Current Outpatient Medications  Medication Sig Dispense Refill   b complex vitamins tablet Take 1 tablet by mouth daily. 100 tablet 3   BIOTIN PO Take by mouth daily.     Cholecalciferol (VITAMIN D3) 50 MCG (2000 UT) capsule Take 1 capsule (2,000 Units total) by mouth daily. 100 capsule 3   glucosamine-chondroitin (MAX GLUCOSAMINE CHONDROITIN) 500-400 MG tablet Take 1 tablet by mouth 3 (three) times daily. 100 tablet 3   ibuprofen (ADVIL) 600 MG tablet Take twice a day  prn pain 60 tablet 3   methimazole (TAPAZOLE) 5 MG tablet Take 1 tablet (5 mg total) by mouth daily. 90 tablet 3   Omega-3 Fatty Acids (OMEGA 3  PO) Take by mouth daily.     omeprazole (PRILOSEC) 40 MG capsule Take 1 capsule (40 mg total) by mouth daily. 90 capsule 1   Probiotic Product (PROBIOTIC PO) Take by mouth.     TURMERIC PO Take by mouth daily.     No current facility-administered medications for this visit.    PHYSICAL EXAM: Vitals:   02/02/24 0808  BP: 100/60  Pulse: 76  Resp: 20  SpO2: 98%  Weight: 130 lb 6.4 oz (59.1 kg)  Height: 5\' 6"  (1.676 m)     Body mass index is 21.05 kg/m.  Wt Readings from Last 3 Encounters:  02/02/24 130 lb 6.4 oz (59.1 kg)  12/05/23 130 lb (59 kg)  12/02/23 129 lb (58.5 kg)    General: Well developed, well nourished female in no apparent distress.  HEENT: AT/[place], no external lesions. Hearing intact to the spoken word Eyes: EOMI. No stare, proptosis or lid lag. Conjunctiva clear and no icterus. No erythema or watering Neck: Trachea midline, neck supple without appreciable thyromegaly or lymphadenopathy and no palpable thyroid nodules Lungs: Clear to auscultation, no wheeze. Respirations not labored Heart: S1S2, Regular in rate and rhythm.  Abdomen: Soft, non tender, non distended Neurologic: Alert, oriented, normal speech, deep tendon biceps reflexes normal,  no gross focal neurological deficit Extremities: No pedal pitting edema, no tremors of outstretched hands Skin: Warm, color good.  Psychiatric: Does not appear depressed.Marland Kitchen  PERTINENT HISTORIC LABORATORY AND IMAGING STUDIES:  All pertinent laboratory results were reviewed. Please see HPI also for further details.   TSH  Date Value Ref Range Status  12/05/2023 0.01 (L) 0.40 - 4.50 mIU/L Final  10/04/2023 <0.01 (L) 0.35 - 5.50 uIU/mL Final  07/11/2023 <0.01 Repeated and verified X2. (L) 0.35 - 5.50 uIU/mL Final    Lab Results  Component Value Date   FREET4 2.4 (H) 12/05/2023   FREET4 1.90 (H) 10/04/2023   FREET4 2.07 (H) 07/12/2023   T3FREE 7.9 (H) 12/05/2023   T3FREE 4.9 (H) 10/04/2023   T3FREE 5.5 (H) 07/12/2023    TSH 0.01 (L) 12/05/2023   TSH <0.01 (L) 10/04/2023   TSH <0.01 Repeated and verified X2. (L) 07/11/2023    No results found for: "THYROTRECAB"  Lab Results  Component Value Date   TSH 0.01 (L) 12/05/2023   TSH <0.01 (L) 10/04/2023   TSH <0.01 Repeated and verified X2. (L) 07/11/2023   FREET4 2.4 (H) 12/05/2023   FREET4 1.90 (H) 10/04/2023   FREET4 2.07 (H) 07/12/2023     Lab Results  Component Value Date   TSI 313 (H) 10/04/2023     No components found for: "TRAB"    ASSESSMENT / PLAN  1. Hyperthyroidism   2. Graves disease   3. Thyroid nodule     -Patient has hyperthyroidism starting in August 2024.  Patient had positive TSI, anti-TPO antibody, thyroglobulin antibody and normal TRAb.  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   -Patient has been taking methimazole 5 mg daily since January 2025. -Ultrasound thyroid in July 2025 with ill-defined nodule of the area likely pseudonodules, will consider repeat ultrasound in the future.  Plan: -Will check thyroid function test TSH, free T4, free T3.  Diagnoses and all orders for this visit:  Hyperthyroidism -     T4, free -     T3, free -     TSH  Graves disease  Thyroid nodule    DISPOSITION Follow up in clinic in 3 months suggested.  Labs today and prior to follow-up visit.  All questions answered and patient verbalized understanding of the plan.  Priscilla Russ Looper, MD [facility] Endocrinology [facility] [address] [place], [address] [place], [place] [address] Phone # [phone]   At least part of this note was generated using voice recognition software. Inadvertent word errors may have occurred, which were not recognized during the proofreading process.

## 2024-02-03 ENCOUNTER — Encounter: Payer: Self-pay | Admitting: Rehabilitative and Restorative Service Providers"

## 2024-02-03 ENCOUNTER — Ambulatory Visit: Payer: 59 | Admitting: Rehabilitative and Restorative Service Providers"

## 2024-02-03 DIAGNOSIS — R278 Other lack of coordination: Secondary | ICD-10-CM

## 2024-02-03 DIAGNOSIS — M79641 Pain in right hand: Secondary | ICD-10-CM | POA: Diagnosis not present

## 2024-02-03 DIAGNOSIS — M25542 Pain in joints of left hand: Secondary | ICD-10-CM

## 2024-02-03 DIAGNOSIS — M6281 Muscle weakness (generalized): Secondary | ICD-10-CM

## 2024-02-03 DIAGNOSIS — R202 Paresthesia of skin: Secondary | ICD-10-CM

## 2024-04-03 ENCOUNTER — Other Ambulatory Visit: Payer: Federal, State, Local not specified - PPO

## 2024-04-03 ENCOUNTER — Inpatient Hospital Stay: Payer: Federal, State, Local not specified - PPO | Attending: Oncology | Admitting: Oncology

## 2024-04-03 ENCOUNTER — Inpatient Hospital Stay: Payer: Self-pay

## 2024-04-03 VITALS — BP 114/76 | HR 100 | Temp 98.2°F | Resp 18 | Ht 66.0 in | Wt 130.7 lb

## 2024-04-03 DIAGNOSIS — Z86018 Personal history of other benign neoplasm: Secondary | ICD-10-CM | POA: Diagnosis not present

## 2024-04-03 DIAGNOSIS — Z9221 Personal history of antineoplastic chemotherapy: Secondary | ICD-10-CM | POA: Insufficient documentation

## 2024-04-03 DIAGNOSIS — Z85038 Personal history of other malignant neoplasm of large intestine: Secondary | ICD-10-CM | POA: Diagnosis present

## 2024-04-03 DIAGNOSIS — C187 Malignant neoplasm of sigmoid colon: Secondary | ICD-10-CM

## 2024-04-03 DIAGNOSIS — D509 Iron deficiency anemia, unspecified: Secondary | ICD-10-CM | POA: Insufficient documentation

## 2024-04-03 DIAGNOSIS — Z9049 Acquired absence of other specified parts of digestive tract: Secondary | ICD-10-CM | POA: Diagnosis not present

## 2024-04-03 LAB — CEA (ACCESS): CEA (CHCC): 1.01 ng/mL (ref 0.00–5.00)

## 2024-04-03 NOTE — Progress Notes (Signed)
 [facility] OFFICE PROGRESS NOTE   Diagnosis: Colon cancer  INTERVAL HISTORY:   Priscilla Hill returns as scheduled.  She feels well.  She was diagnosed with hyperthyroidism last year.  She is taking methimazole .  She reports constipation for the past several months.  She has a bowel movement daily.  The stool is firm.  She has chronic "hemorrhoid "bleeding when she has a firm stool.  This has not changed.  She notes intermittent discomfort in the right lower abdomen for the past few months.  She reports intentional weight loss with a change in her diet. No dyspnea. Objective:  Vital signs in last 24 hours:  Blood pressure 114/76, pulse 100, temperature 98.2 F (36.8 C), temperature source Temporal, resp. rate 18, height 5\' 6"  (1.676 m), weight 130 lb 11.2 oz (59.3 kg), SpO2 100%.    Lymphatics: No cervical, supraclavicular, or inguinal nodes.  Soft mobile bilateral axillary nodes versus prominent fat pads Resp: Lungs clear bilaterally with a decrease in breath sounds at the right compared to the left chest, no respiratory distress, no dullness to percussion Cardio: Regular rate and rhythm GI: Nontender, no mass, soft, no hepatomegaly, no apparent ascites Vascular: No leg edema  Lab Results:  Lab Results  Component Value Date   WBC 5.3 07/11/2023   HGB 12.8 07/11/2023   HCT 39.5 07/11/2023   MCV 90.4 07/11/2023   PLT 234.0 07/11/2023   NEUTROABS 3.2 07/11/2023    CMP  Lab Results  Component Value Date   NA 145 07/11/2023   K 4.0 07/11/2023   CL 109 07/11/2023   CO2 24 07/11/2023   GLUCOSE 103 (H) 07/11/2023   BUN 14 07/11/2023   CREATININE 0.65 07/11/2023   CALCIUM  9.9 07/11/2023   PROT 7.1 07/11/2023   ALBUMIN 4.2 07/11/2023   AST 20 07/11/2023   ALT 16 07/11/2023   ALKPHOS 62 07/11/2023   BILITOT 0.7 07/11/2023   GFRNONAA 99 04/23/2021   GFRAA 115 04/23/2021    Lab Results  Component Value Date   CEA1 <1.00 04/03/2021   CEA 1.01 04/03/2024    Medications: I have reviewed the patient's current medications.   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 The tumor returned microsatellite stable with no loss of mismatch repair protein expression. Cycle 1 adjuvant FOLFOX chemotherapy 05/28/2013.   Oxaliplatin  was held beginning with cycle 8 due to neuropathy symptoms.   Final cycle of 5-fluorouracil /leucovorin  given on 10/29/2013. 03/04/2014 CEA less than 0.5. 04/02/2014 CT chest/abdomen/pelvis negative for metastatic disease. Colonoscopy 04/17/2014-negative 04/07/2015 CT chest/abdomen/pelvis negative for metastatic disease CTs of the chest, abdomen, and pelvis 03/24/2016-negative for recurrent colon cancer Surveillance colonoscopy 05/20/2017- 8 mm polyp in the ascending colon, 5 mm polyp in the transverse colon (Surgical [P], ascending, transverse, polyps (2) SESSILE SERRATED POLYP (FIVE FRAGMENTS). NO HIGH GRADE DYSPLASIA OR MALIGNANCY. CT abdomen/pelvis 02/21/2018 to evaluate intermittent right lower quadrant pain- no evidence of recurrent or metastatic disease. Colonoscopy 08/01/2020- no polyps History of microcytic anemia.   Port-A-Cath placement 05/24/2013. History of uterine fibroids. Nausea following cycle 1 FOLFOX. Aloxi  and prophylactic Decadron  were added with cycle 2. She had nausea following cycle 2. Emend was added with cycle 3. Improved. No nausea following cycle 5 or 6. Mild neutropenia secondary chemotherapy. She received Neulasta  beginning with cycle 4 FOLFOX   Abdominal discomfort following cycle 4 FOLFOX. Question gastritis related to steroids. She began Prilosec 20 mg daily. History of mild  thrombocytopenia secondary chemotherapy. Pruritus and erythema over the palms beginning day 4 following cycle 5 FOLFOX and lasting approximately 3 days. Question hand-foot syndrome related to 5-fluorouracil , question allergic reaction.  She was instructed to contact the office if she experiences this again. Oxaliplatin  neuropathy affecting fingertips/toes and tongue. Persistent numbness in the feet  Soft mobile left axillary lymph node on exam 10/07/2014. Stable on exam 04/09/2015. Not noted on exam 10/06/2015 or 03/29/2016.  Present on exam 04/03/2018, 04/02/2020, 04/01/2023 Hyperthyroidism      Disposition: Priscilla Hill remains in clinical remission from colon cancer.  She would like to continue follow-up at the Cancer center.  She will return for an office visit in 1 year.  She continues colonoscopy surveillance with Dr. General Kenner.  The etiology of the constipation is unclear.  I recommended she follow-up with Dr. General Kenner if the constipation and right lower abdominal pain persist, and to evaluate persistent hemorrhoid bleeding.    Coni Deep, MD  04/03/2024  9:21 AM

## 2024-04-04 ENCOUNTER — Telehealth: Payer: Self-pay | Admitting: Oncology

## 2024-04-04 NOTE — Telephone Encounter (Signed)
 Patient has been scheduled for follow-up visit per 04/03/24 LOS.  LVM notifying pt of appt details, provided my direct number to pt if appt changes need to be made.

## 2024-04-25 NOTE — Progress Notes (Deleted)
 Office Visit Note  Patient: Priscilla Hill             Date of Birth: 1966-06-26           MRN: [id number]             PCP: Genia Kettering, MD Referring: Genia Kettering, MD Visit Date: 05/09/2024 Occupation: @GUAROCC @  Subjective:  No chief complaint on file.   History of Present Illness: Priscilla Hill is a 57 y.o. female ***     Activities of Daily Living:  Patient reports morning stiffness for *** {minute/hour:19697}.   Patient {ACTIONS;DENIES/REPORTS:21021675::"Denies"} nocturnal pain.  Difficulty dressing/grooming: {ACTIONS;DENIES/REPORTS:21021675::"Denies"} Difficulty climbing stairs: {ACTIONS;DENIES/REPORTS:21021675::"Denies"} Difficulty getting out of chair: {ACTIONS;DENIES/REPORTS:21021675::"Denies"} Difficulty using hands for taps, buttons, cutlery, and/or writing: {ACTIONS;DENIES/REPORTS:21021675::"Denies"}  No Rheumatology ROS completed.   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   Well adult exam 11/14/2012   Hematochezia 11/14/2012   Paresthesia 11/14/2012   Dyslipidemia 11/14/2012   URI, acute 05/31/2011   Vaginitis 05/31/2011   Anxiety 04/17/2010   SHOULDER PAIN 04/17/2010   THROAT PAIN, CHRONIC 03/14/2009   Dysphagia 03/14/2009   Conjunctivitis 12/05/2008   Allergic rhinitis 10/08/2008   Nonspecific (abnormal) findings on radiological and other examination of body structure 10/08/2008   CHEST XRAY, ABNORMAL 10/08/2008   Acute bronchitis 09/24/2008   Cough 09/24/2008    Past Medical History:  Diagnosis Date   Anemia    Anxiety    Arthritis    Cancer (HCC)    GERD (gastroesophageal reflux disease)    H. pylori infection    positive IgG serology   History of multiple miscarriages    History of positive PPD    never any symptoms, due to being from [place]? different "vaccine"   Hyperlipidemia    mild   Hyperthyroidism     Family History  Problem Relation Age of Onset   Hypertension Mother    Diabetes Mother    Heart disease Mother    Stroke Father 65   Hypertension Father    Heart disease Maternal Grandmother    Heart disease Maternal Grandfather    Stomach cancer Other        grandmother   Healthy Daughter    Healthy Daughter    Colon cancer Neg Hx    Past Surgical History:  Procedure Laterality Date   childbirth   04-17-13   x 2 NVD  ESSURE TUBAL LIGATION     HYSTEROSCOPY WITH RESECTOSCOPE  04-17-13   "fibroids removed"   LAPAROSCOPIC PARTIAL COLECTOMY N/A 04/24/2013   Procedure: LAPAROSCOPIC ASSISTED SIGMOID COLECTOMY;  Surgeon: Harlee Lichtenstein, MD;  Location: [facility];  Service: General;  Laterality: N/A;   PORTACATH PLACEMENT Right 05/24/2013   Procedure: INSERTION PORT-A-CATH;  Surgeon: Harlee Lichtenstein, MD;  Location: [facility];  Service: General;  Laterality: Right;   Social History   Social  History Narrative   Married, husband Ernestina Headland   #1 small 6 yo in home and cares for her 71 year old mother   Both her and husband have #2 grown children each from prior marriage-her's are in [place]   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 Go to the Rheumatology activity and complete the homunculus joint exam.  Investigation: No additional findings.  Imaging: No results found.  Recent Labs: Lab Results  Component Value Date   WBC 5.3 07/11/2023   HGB 12.8 07/11/2023   PLT 234.0 07/11/2023   NA 145 07/11/2023   K 4.0 07/11/2023   CL 109 07/11/2023   CO2 24 07/11/2023   GLUCOSE 103 (H) 07/11/2023   BUN 14 07/11/2023   CREATININE 0.65 07/11/2023   BILITOT 0.7 07/11/2023   ALKPHOS 62 07/11/2023   AST 20 07/11/2023   ALT 16 07/11/2023   PROT 7.1 07/11/2023   ALBUMIN 4.2 07/11/2023   CALCIUM  9.9 07/11/2023   GFRAA 115 04/23/2021    Speciality Comments: No specialty comments available.  Procedures:  No procedures performed Allergies: Patient has no known allergies.   Assessment / Plan:     Visit Diagnoses: No diagnosis found.  Orders: No orders of the defined types were placed in this encounter.  No orders of the defined types were placed in this encounter.   Face-to-face time spent with patient was *** minutes. Greater than 50% of time was spent in counseling and coordination of  care.  Follow-Up Instructions: No follow-ups on file.   Dee Farber, CMA  Note - This record has been created using Animal nutritionist.  Chart creation errors have been sought, but may not always  have been located. Such creation errors do not reflect on  the standard of medical care.

## 2024-05-04 ENCOUNTER — Other Ambulatory Visit

## 2024-05-05 LAB — T3, FREE: T3, Free: 2.7 pg/mL (ref 2.3–4.2)

## 2024-05-05 LAB — TSH: TSH: 2.29 m[IU]/L (ref 0.40–4.50)

## 2024-05-05 LAB — T4, FREE: Free T4: 1 ng/dL (ref 0.8–1.8)

## 2024-05-07 ENCOUNTER — Ambulatory Visit: Payer: Self-pay | Admitting: Endocrinology

## 2024-05-09 ENCOUNTER — Ambulatory Visit (INDEPENDENT_AMBULATORY_CARE_PROVIDER_SITE_OTHER): Admitting: Endocrinology

## 2024-05-09 ENCOUNTER — Ambulatory Visit: Payer: Federal, State, Local not specified - PPO | Admitting: Rheumatology

## 2024-05-09 ENCOUNTER — Encounter: Payer: Self-pay | Admitting: Endocrinology

## 2024-05-09 VITALS — BP 104/60 | HR 72 | Resp 20 | Ht 66.0 in | Wt 133.4 lb

## 2024-05-09 DIAGNOSIS — E0590 Thyrotoxicosis, unspecified without thyrotoxic crisis or storm: Secondary | ICD-10-CM

## 2024-05-09 DIAGNOSIS — D500 Iron deficiency anemia secondary to blood loss (chronic): Secondary | ICD-10-CM

## 2024-05-09 DIAGNOSIS — E0500 Thyrotoxicosis with diffuse goiter without thyrotoxic crisis or storm: Secondary | ICD-10-CM | POA: Diagnosis not present

## 2024-05-09 DIAGNOSIS — N96 Recurrent pregnancy loss: Secondary | ICD-10-CM

## 2024-05-09 DIAGNOSIS — E559 Vitamin D deficiency, unspecified: Secondary | ICD-10-CM

## 2024-05-09 DIAGNOSIS — K2970 Gastritis, unspecified, without bleeding: Secondary | ICD-10-CM

## 2024-05-09 DIAGNOSIS — G620 Drug-induced polyneuropathy: Secondary | ICD-10-CM

## 2024-05-09 DIAGNOSIS — G8929 Other chronic pain: Secondary | ICD-10-CM

## 2024-05-09 DIAGNOSIS — R768 Other specified abnormal immunological findings in serum: Secondary | ICD-10-CM

## 2024-05-09 DIAGNOSIS — L659 Nonscarring hair loss, unspecified: Secondary | ICD-10-CM

## 2024-05-09 DIAGNOSIS — M1711 Unilateral primary osteoarthritis, right knee: Secondary | ICD-10-CM

## 2024-05-09 DIAGNOSIS — R5383 Other fatigue: Secondary | ICD-10-CM

## 2024-05-09 DIAGNOSIS — M19071 Primary osteoarthritis, right ankle and foot: Secondary | ICD-10-CM

## 2024-05-09 DIAGNOSIS — Z9289 Personal history of other medical treatment: Secondary | ICD-10-CM

## 2024-05-09 DIAGNOSIS — M5416 Radiculopathy, lumbar region: Secondary | ICD-10-CM

## 2024-05-09 DIAGNOSIS — E785 Hyperlipidemia, unspecified: Secondary | ICD-10-CM

## 2024-05-09 DIAGNOSIS — M19041 Primary osteoarthritis, right hand: Secondary | ICD-10-CM

## 2024-05-09 DIAGNOSIS — C187 Malignant neoplasm of sigmoid colon: Secondary | ICD-10-CM

## 2024-05-09 DIAGNOSIS — B351 Tinea unguium: Secondary | ICD-10-CM

## 2024-05-09 NOTE — Progress Notes (Signed)
 Outpatient Endocrinology Note [place] Essynce Munsch, MD  05/09/24  Patient's Name: Priscilla Hill    DOB: 1966/06/24    MRN: [id number]  REASON OF VISIT: Follow up for hyperthyroidism / Graves disease  REFERRING PROVIDER: Genia Kettering, MD   PCP: Genia Kettering, MD  HISTORY OF PRESENT ILLNESS:   Priscilla Hill is a 58 y.o. old female with past medical history as listed below is presented for follow-up of hyperthyroidism / Graves' disease.   Pertinent Thyroid  History: Patient had symptoms of dry cough, throat pain along with not feeling good without symptoms of anxiety, mood change, weight loss, hot flashes, low energy, insomnia which was started around June/July 2024.  She had sore throat, loose bowel movement and hand tremors as well at that time.  She was evaluated by primary care provider in August 2024 and had thyroid  function test, on August 19, 20th 2024 with suppressed TSH and elevated free T4 of 2.07 and free T3 of 5.5.  She had normal TSH of 1.43 in August 2023, and referred to endocrinology.  Patient was initially evaluated in November 2024, she continued to have elevated thyroid  hormone levels along with positive TSI, normal TRAB, mildly elevated anti-TPO and thyroglobulin antibody overall possibley consistent with autoimmune hyperthyroidism/Graves' disease.    Patient was planned for methimazole  5 mg daily in November 2024.  Patient did not start.  Patient continued to have elevated thyroid  hormone levels consistent with hyperthyroidism, worsening and methimazole  5 mg daily was restarted in January 2025.  No family history of thyroid  disorder.   Labs:   Latest Reference Range & Units 10/04/23 08:42  Thyroglobulin Ab < or = 1 IU/mL 12 (H)  Thyroperoxidase Ab SerPl-aCnc <9 IU/mL 71 (H)  THYROID  STIMULATING IMMUNOGLOBULIN  Rpt !  TSI <140 % baseline 313 (H)  (H): Data is abnormally high !: Data is abnormal Rpt: View report in Results Review for more information    Latest Reference Range & Units 10/04/23 08:42  TRAB <=2.00 IU/L 1.24    # December 08, 2023 ultrasound thyroid  : overall heterogenous thyroid  parenchyma, with ill-defined hypoechoic area likely pseudonodules.      Interval history  Patient has been taking methimazole  5 mg daily.  She complains of occasional constipation otherwise no new symptoms.  No palpitation or heat intolerance.  Occasionally feels neck discomfort.  She reports dry eyes, no watering or redness.  No recent illness or fever or any stomach upset.  Patient thyroid  function test has finally normalized with normal TSH of 2.29.     Latest Reference Range & Units 05/04/24 15:43  TSH 0.40 - 4.50 mIU/L 2.29  Triiodothyronine,Free,Serum 2.3 - 4.2 pg/mL 2.7  T4,Free(Direct) 0.8 - 1.8 ng/dL 1.0    REVIEW OF SYSTEMS:  As per history of present illness.   PAST MEDICAL HISTORY: Past Medical History:  Diagnosis Date   Anemia    Anxiety    Arthritis    Cancer (HCC)    GERD (gastroesophageal reflux disease)    H. pylori infection    positive IgG serology   History of multiple miscarriages    History of positive PPD    never any symptoms, due to being from [place]? different vaccine   Hyperlipidemia    mild   Hyperthyroidism     PAST SURGICAL HISTORY: Past Surgical History:  Procedure Laterality Date   childbirth   04-17-13   x 2 NVD   ESSURE TUBAL LIGATION     HYSTEROSCOPY WITH RESECTOSCOPE  04-17-13   fibroids removed   LAPAROSCOPIC PARTIAL COLECTOMY N/A 04/24/2013   Procedure: LAPAROSCOPIC ASSISTED SIGMOID COLECTOMY;  Surgeon: Harlee Lichtenstein, MD;  Location: [facility];  Service: General;  Laterality: N/A;   PORTACATH PLACEMENT Right 05/24/2013   Procedure: INSERTION PORT-A-CATH;  Surgeon: Harlee Lichtenstein, MD;  Location: [facility];  Service: General;  Laterality: Right;    ALLERGIES: No Known Allergies  FAMILY HISTORY:  Family History  Problem Relation Age of Onset   Hypertension Mother    Diabetes Mother     Heart disease Mother    Stroke Father 40   Hypertension Father    Heart disease Maternal Grandmother    Heart disease Maternal Grandfather    Stomach cancer Other        grandmother   Healthy Daughter    Healthy Daughter    Colon cancer Neg Hx     SOCIAL HISTORY: Social History   Socioeconomic History   Marital status: Married    Spouse name: Ernestina Headland   Number of children: 2   Years of education: Not on file   Highest education level: Not on file  Occupational History   Occupation: Dietary Event organiser: [email or web address]     Comment: [employer]  Tobacco Use   Smoking status: Never    Passive exposure: Never   Smokeless tobacco: Never  Vaping Use   Vaping status: Never Used  Substance and Sexual Activity   Alcohol use: Yes    Alcohol/week: 0.0 standard drinks of alcohol    Comment: occasionally   Drug use: No   Sexual activity: Yes  Other Topics Concern   Not on file  Social History Narrative   Married, husband Ernestina Headland   #1 small 6 yo in home and cares for her 84 year old mother   Both her and husband have #2 grown children each from prior marriage-her's are in [place]   Dietary Production designer, theatre/television/film at [employer] in [place], [place]   Independent in ADL's and able to drive   Speaks English well   Social Drivers of Health   Financial Resource Strain: Not on file  Food Insecurity: Not on file  Transportation Needs: Not on file  Physical Activity: Not on file  Stress: Not on file  Social Connections: Not on file    MEDICATIONS:  Current Outpatient Medications  Medication Sig Dispense Refill   b complex vitamins tablet Take 1 tablet by mouth daily. 100 tablet 3   glucosamine-chondroitin (MAX GLUCOSAMINE CHONDROITIN) 500-400 MG tablet Take 1 tablet by mouth 3 (three) times daily. 100 tablet 3   methimazole  (TAPAZOLE ) 5 MG tablet Take 1 tablet (5 mg total) by mouth daily. 90 tablet 3   Omega-3 Fatty Acids (OMEGA 3 PO) Take by mouth daily.     omeprazole   (PRILOSEC) 40 MG capsule Take 1 capsule (40 mg total) by mouth daily. 90 capsule 1   OVER THE COUNTER MEDICATION Take 1 tablet by mouth daily. Vitamin D3 + K2 (10 units) per patient     TURMERIC PO Take by mouth daily.     No current facility-administered medications for this visit.    PHYSICAL EXAM: Vitals:   05/09/24 0808  BP: 104/60  Pulse: 72  Resp: 20  SpO2: 98%  Weight: 133 lb 6.4 oz (60.5 kg)  Height: 5' 6 (1.676 m)     Body mass index is 21.53 kg/m.  Wt Readings from Last 3 Encounters:  05/09/24 133 lb 6.4  oz (60.5 kg)  04/03/24 130 lb 11.2 oz (59.3 kg)  02/02/24 130 lb 6.4 oz (59.1 kg)    General: Well developed, well nourished female in no apparent distress.  HEENT: AT/[place], no external lesions. Hearing intact to the spoken word Eyes: EOMI. No stare, proptosis or lid lag. Conjunctiva clear and no icterus. No erythema or watering Neck: Trachea midline, neck supple without appreciable thyromegaly or lymphadenopathy and no palpable thyroid  nodules Lungs: Clear to auscultation, no wheeze. Respirations not labored Heart: S1S2, Regular in rate and rhythm.  Abdomen: Soft, non tender, non distended Neurologic: Alert, oriented, normal speech, deep tendon biceps reflexes normal,  no gross focal neurological deficit Extremities: No pedal pitting edema, no tremors of outstretched hands Skin: Warm, color good.  Psychiatric: Does not appear depressed.Aaron Aas  PERTINENT HISTORIC LABORATORY AND IMAGING STUDIES:  All pertinent laboratory results were reviewed. Please see HPI also for further details.   TSH  Date Value Ref Range Status  05/04/2024 2.29 0.40 - 4.50 mIU/L Final  02/02/2024 0.01 (L) 0.40 - 4.50 mIU/L Final  12/05/2023 0.01 (L) 0.40 - 4.50 mIU/L Final    Lab Results  Component Value Date   FREET4 1.0 05/04/2024   FREET4 1.1 02/02/2024   FREET4 2.4 (H) 12/05/2023   T3FREE 2.7 05/04/2024   T3FREE 3.2 02/02/2024   T3FREE 7.9 (H) 12/05/2023   TSH 2.29 05/04/2024    TSH 0.01 (L) 02/02/2024   TSH 0.01 (L) 12/05/2023    No results found for: THYROTRECAB  Lab Results  Component Value Date   TSH 2.29 05/04/2024   TSH 0.01 (L) 02/02/2024   TSH 0.01 (L) 12/05/2023   FREET4 1.0 05/04/2024   FREET4 1.1 02/02/2024   FREET4 2.4 (H) 12/05/2023     Lab Results  Component Value Date   TSI 313 (H) 10/04/2023     No components found for: TRAB    ASSESSMENT / PLAN  1. Hyperthyroidism   2. Graves disease      -Patient has hyperthyroidism starting in August 2024.  Patient had positive TSI, anti-TPO antibody, thyroglobulin antibody and normal TRAb.  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   -Patient has been taking methimazole  5 mg daily since January 2025. -Ultrasound thyroid  in July 2025 with ill-defined nodule of the area likely pseudonodules, will consider repeat ultrasound in the future.  Plan: -Recent thyroid  function test has been normal.  Will continue current dose of methimazole  5 mg daily. -Will check thyroid  function test TSH, free T4, free T3 prior to follow-up visit in 3 months.  Diagnoses and all orders for this visit:  Hyperthyroidism  Graves disease -     T3, free -     T4, free -     TSH     DISPOSITION Follow up in clinic in 3 months suggested.  Labs prior to follow-up visit.  All questions answered and patient verbalized understanding of the plan.  [place] Aemilia Dedrick, MD [facility] Endocrinology [facility] [address] [place], [address] [place], [place] [address] Phone # [phone]   At least part of this note was generated using voice recognition software. Inadvertent word errors may have occurred, which were not recognized during the proofreading process.

## 2024-06-08 ENCOUNTER — Ambulatory Visit: Payer: 59 | Admitting: Rheumatology

## 2024-07-12 ENCOUNTER — Ambulatory Visit (INDEPENDENT_AMBULATORY_CARE_PROVIDER_SITE_OTHER): Payer: Federal, State, Local not specified - PPO | Admitting: Internal Medicine

## 2024-07-12 ENCOUNTER — Encounter: Payer: Self-pay | Admitting: Internal Medicine

## 2024-07-12 VITALS — BP 104/62 | HR 77 | Temp 99.4°F | Ht 66.0 in | Wt 130.2 lb

## 2024-07-12 DIAGNOSIS — E039 Hypothyroidism, unspecified: Secondary | ICD-10-CM | POA: Insufficient documentation

## 2024-07-12 DIAGNOSIS — Z8589 Personal history of malignant neoplasm of other organs and systems: Secondary | ICD-10-CM

## 2024-07-12 DIAGNOSIS — D6861 Antiphospholipid syndrome: Secondary | ICD-10-CM

## 2024-07-12 DIAGNOSIS — E559 Vitamin D deficiency, unspecified: Secondary | ICD-10-CM

## 2024-07-12 DIAGNOSIS — Z13220 Encounter for screening for lipoid disorders: Secondary | ICD-10-CM | POA: Diagnosis not present

## 2024-07-12 DIAGNOSIS — Z85038 Personal history of other malignant neoplasm of large intestine: Secondary | ICD-10-CM | POA: Diagnosis not present

## 2024-07-12 DIAGNOSIS — Z0000 Encounter for general adult medical examination without abnormal findings: Secondary | ICD-10-CM | POA: Diagnosis not present

## 2024-07-12 DIAGNOSIS — E0590 Thyrotoxicosis, unspecified without thyrotoxic crisis or storm: Secondary | ICD-10-CM | POA: Diagnosis not present

## 2024-07-12 DIAGNOSIS — K5900 Constipation, unspecified: Secondary | ICD-10-CM

## 2024-07-12 LAB — CBC WITH DIFFERENTIAL/PLATELET
Basophils Absolute: 0 K/uL (ref 0.0–0.1)
Basophils Relative: 0.8 % (ref 0.0–3.0)
Eosinophils Absolute: 0.1 K/uL (ref 0.0–0.7)
Eosinophils Relative: 1.7 % (ref 0.0–5.0)
HCT: 40.4 % (ref 36.0–46.0)
Hemoglobin: 13.4 g/dL (ref 12.0–15.0)
Lymphocytes Relative: 29.2 % (ref 12.0–46.0)
Lymphs Abs: 1.5 K/uL (ref 0.7–4.0)
MCHC: 33.3 g/dL (ref 30.0–36.0)
MCV: 90.9 fl (ref 78.0–100.0)
Monocytes Absolute: 0.3 K/uL (ref 0.1–1.0)
Monocytes Relative: 6.7 % (ref 3.0–12.0)
Neutro Abs: 3.1 K/uL (ref 1.4–7.7)
Neutrophils Relative %: 61.6 % (ref 43.0–77.0)
Platelets: 244 K/uL (ref 150.0–400.0)
RBC: 4.45 Mil/uL (ref 3.87–5.11)
RDW: 13.2 % (ref 11.5–15.5)
WBC: 5 K/uL (ref 4.0–10.5)

## 2024-07-12 LAB — MICROALBUMIN / CREATININE URINE RATIO
Creatinine,U: 37.5 mg/dL
Microalb Creat Ratio: UNDETERMINED mg/g (ref 0.0–30.0)
Microalb, Ur: <0.7 mg/dL

## 2024-07-12 LAB — COMPREHENSIVE METABOLIC PANEL WITH GFR
ALT: 20 U/L (ref 0–35)
AST: 21 U/L (ref 0–37)
Albumin: 4.4 g/dL (ref 3.5–5.2)
Alkaline Phosphatase: 72 U/L (ref 39–117)
BUN: 9 mg/dL (ref 6–23)
CO2: 30 meq/L (ref 19–32)
Calcium: 9.1 mg/dL (ref 8.4–10.5)
Chloride: 104 meq/L (ref 96–112)
Creatinine, Ser: 0.77 mg/dL (ref 0.40–1.20)
GFR: 84.42 mL/min (ref >60.00)
Glucose, Bld: 97 mg/dL (ref 70–99)
Potassium: 3.5 meq/L (ref 3.5–5.1)
Sodium: 142 meq/L (ref 135–145)
Total Bilirubin: 0.8 mg/dL (ref 0.2–1.2)
Total Protein: 7.3 g/dL (ref 6.0–8.3)

## 2024-07-12 LAB — LIPID PANEL
Cholesterol: 254 mg/dL — ABNORMAL HIGH (ref 0–200)
HDL: 72.8 mg/dL (ref >39.00)
LDL Cholesterol: 170 mg/dL — ABNORMAL HIGH (ref 0–99)
NonHDL: 180.72
Total CHOL/HDL Ratio: 3
Triglycerides: 56 mg/dL (ref 0.0–149.0)
VLDL: 11.2 mg/dL (ref 0.0–40.0)

## 2024-07-12 LAB — URINALYSIS
Bilirubin Urine: NEGATIVE
Ketones, ur: NEGATIVE
Leukocytes,Ua: NEGATIVE
Nitrite: NEGATIVE
Specific Gravity, Urine: <=1.005 — AB (ref 1.000–1.030)
Total Protein, Urine: NEGATIVE
Urine Glucose: NEGATIVE
Urobilinogen, UA: 0.2 (ref 0.0–1.0)
pH: 6 (ref 5.0–8.0)

## 2024-07-12 LAB — VITAMIN D 25 HYDROXY (VIT D DEFICIENCY, FRACTURES): VITD: 64.76 ng/mL (ref 30.00–100.00)

## 2024-07-12 LAB — VITAMIN B12: Vitamin B-12: 582 pg/mL (ref 211–911)

## 2024-07-12 NOTE — Assessment & Plan Note (Signed)
 Markers w/DrSherrill

## 2024-07-12 NOTE — Patient Instructions (Addendum)
 Use Colace or Pericolace daily   USEFUL THINGS FOR ARTHRITIS and musculoskeletal pains:    A rice sock heating pad refers to a homemade heating pad created by filling a sock with uncooked rice, which can be heated in a microwave to provide a warm compress for sore muscles, pain relief, or other applications; essentially, it's a simple way to generate heat using readily available materials.  Key points about rice sock heat: How to make it: Fill a clean sock (preferably a tube sock) about 2/3 full with uncooked rice, tie a knot at the top to secure the rice inside.  Heating it up: Place the rice sock in the microwave and heat in short intervals (usually around 30 seconds at a time) until it reaches the desired warmth.  Important considerations: Check temperature before applying: Always test the temperature of the rice sock before applying it to your skin to avoid burns.  Use a towel to protect skin: Wrap the rice sock in a thin towel to distribute the heat evenly and protect your skin.  Uses: Muscle aches and pains  Menstrual cramps  Neck pain  Arthritis discomfort   SILICONE PADS: Use them to open jars and bottles    BRIX JAR OPENER: Use them to open jars    NITRILE COATED GARDEN GLOVES: Use down to open jars, lift boxes, etc.   THUMB BRACE: Use it for thumb arthritis flareup pain     BLUE EMU CREAM: Use it 2-3 times a day on painful areas

## 2024-07-12 NOTE — Assessment & Plan Note (Signed)
ASA for travel 

## 2024-07-12 NOTE — Assessment & Plan Note (Addendum)
 Endocrinology -- seeing Dr Mercie - labs w/him On Methimizole

## 2024-07-12 NOTE — Assessment & Plan Note (Signed)
We discussed age appropriate health related issues, including available/recomended screening tests and vaccinations. Labs were ordered to be later reviewed . All questions were answered. We discussed one or more of the following - seat belt use, use of sunscreen/sun exposure exercise, fall risk reduction, second hand smoke exposure, firearm use and storage, seat belt use, a need for adhering to healthy diet and exercise. Needs mammo/PAP, ophth appt - she will schedule Labs were ordered.  All questions were answered. A cardiac CT scan for calcium scoring offered Declined all shots

## 2024-07-12 NOTE — Assessment & Plan Note (Signed)
 Use Colace or Pericolace daily

## 2024-07-12 NOTE — Assessment & Plan Note (Signed)
 Vit D

## 2024-07-12 NOTE — Progress Notes (Signed)
 Subjective:  Patient ID: Priscilla Hill, female    DOB: 07/11/1966  Age: 58 y.o. MRN: [id number]  CC: Annual Exam (Patient here for physical. No other concerns)   HPI Priscilla Hill presents for well exam C/o hand OA pains  Outpatient Medications Prior to Visit  Medication Sig Dispense Refill   b complex vitamins tablet Take 1 tablet by mouth daily. 100 tablet 3   methimazole  (TAPAZOLE ) 5 MG tablet Take 1 tablet (5 mg total) by mouth daily. 90 tablet 3   Omega-3 Fatty Acids (OMEGA 3 PO) Take by mouth daily.     omeprazole  (PRILOSEC) 40 MG capsule Take 1 capsule (40 mg total) by mouth daily. (Patient taking differently: Take 40 mg by mouth as needed.) 90 capsule 1   OVER THE COUNTER MEDICATION Take 1 tablet by mouth daily. Vitamin D3 + K2 (10 units) per patient     TURMERIC PO Take by mouth daily.     glucosamine-chondroitin (MAX GLUCOSAMINE CHONDROITIN) 500-400 MG tablet Take 1 tablet by mouth 3 (three) times daily. (Patient not taking: Reported on 07/12/2024) 100 tablet 3   No facility-administered medications prior to visit.    ROS: Review of Systems  Constitutional:  Negative for activity change, appetite change, chills, fatigue and unexpected weight change.  HENT:  Negative for congestion, mouth sores and sinus pressure.   Eyes:  Negative for visual disturbance.  Respiratory:  Negative for cough and chest tightness.   Gastrointestinal:  Negative for abdominal pain and nausea.  Genitourinary:  Negative for difficulty urinating, frequency and vaginal pain.  Musculoskeletal:  Negative for back pain and gait problem.  Skin:  Negative for pallor and rash.  Neurological:  Negative for dizziness, tremors, weakness, numbness and headaches.  Psychiatric/Behavioral:  Negative for confusion, sleep disturbance and suicidal ideas.     Objective:  BP 104/62 (BP Location: Left Arm, Patient Position: Sitting)   Pulse 77   Temp 99.4 F (37.4 C) (Oral)   Ht 5' 6 (1.676 m)   Wt 130 lb 3.2  oz (59.1 kg)   LMP  (LMP Unknown)   SpO2 97%   BMI 21.01 kg/m   BP Readings from Last 3 Encounters:  07/12/24 104/62  05/09/24 104/60  04/03/24 114/76    Wt Readings from Last 3 Encounters:  07/12/24 130 lb 3.2 oz (59.1 kg)  05/09/24 133 lb 6.4 oz (60.5 kg)  04/03/24 130 lb 11.2 oz (59.3 kg)    Physical Exam Constitutional:      General: She is not in acute distress.    Appearance: She is well-developed.  HENT:     Head: Normocephalic.     Right Ear: External ear normal.     Left Ear: External ear normal.     Nose: Nose normal.  Eyes:     General:        Right eye: No discharge.        Left eye: No discharge.     Conjunctiva/sclera: Conjunctivae normal.     Pupils: Pupils are equal, round, and reactive to light.  Neck:     Thyroid : No thyromegaly.     Vascular: No JVD.     Trachea: No tracheal deviation.  Cardiovascular:     Rate and Rhythm: Normal rate and regular rhythm.     Heart sounds: Normal heart sounds.  Pulmonary:     Effort: No respiratory distress.     Breath sounds: No stridor. No wheezing.  Abdominal:  General: Bowel sounds are normal. There is no distension.     Palpations: Abdomen is soft. There is no mass.     Tenderness: There is no abdominal tenderness. There is no guarding or rebound.  Musculoskeletal:        General: No tenderness.     Cervical back: Normal range of motion and neck supple. No rigidity.  Lymphadenopathy:     Cervical: No cervical adenopathy.  Skin:    Findings: No erythema or rash.  Neurological:     Cranial Nerves: No cranial nerve deficit.     Motor: No abnormal muscle tone.     Coordination: Coordination normal.     Deep Tendon Reflexes: Reflexes normal.  Psychiatric:        Behavior: Behavior normal.        Thought Content: Thought content normal.        Judgment: Judgment normal.     Lab Results  Component Value Date   WBC 5.3 07/11/2023   HGB 12.8 07/11/2023   HCT 39.5 07/11/2023   PLT 234.0 07/11/2023    GLUCOSE 103 (H) 07/11/2023   CHOL 215 (H) 07/11/2023   TRIG 80.0 07/11/2023   HDL 48.20 07/11/2023   LDLCALC 151 (H) 07/11/2023   ALT 16 07/11/2023   AST 20 07/11/2023   NA 145 07/11/2023   K 4.0 07/11/2023   CL 109 07/11/2023   CREATININE 0.65 07/11/2023   BUN 14 07/11/2023   CO2 24 07/11/2023   TSH 2.29 05/04/2024   INR 0.94 05/23/2013    US  THYROID  Result Date: 12/10/2023 CLINICAL DATA:  Hyperthyroid. EXAM: THYROID  ULTRASOUND TECHNIQUE: Ultrasound examination of the thyroid  gland and adjacent soft tissues was performed. COMPARISON:  None Available. FINDINGS: Parenchymal Echotexture: Moderately heterogenous - no definitive evidence of glandular hyperemia. Isthmus: Normal in size measuring 0.4 cm in diameter Right lobe: Normal in size measuring 4.1 x 1.7 x 1.4 cm Left lobe: Slightly atrophic measuring 3.9 x 1.9 x 1.4 cm _________________________________________________________ Estimated total number of nodules >/= 1 cm: 2 Number of spongiform nodules >/=  2 cm not described below (TR1): 0 Number of mixed cystic and solid nodules >/= 1.5 cm not described below (TR2): 0 _________________________________________________________ Questioned 1.9 x 0.8 x 0.8 cm ill-defined hypoechoic nodule within the inferior medial aspect of the right lobe of the thyroid  (labeled 1) is favored to represent a pseudonodule as it lacks defined borders on both provided transverse and sagittal images and as confirmed on provided cine series 3. _________________________________________________________ There is a punctate (0.5 cm) nodule/pseudonodule within the superior pole of the left lobe of the thyroid  (labeled 2), which does not meet criteria to recommend percutaneous sampling or continued dedicated follow-up. There is a 0.9 x 0.5 x 0.5 cm hypoechoic nodule within the mid, posterior aspect of the left lobe of the thyroid  (labeled 3), which does not meet imaging criteria to recommend percutaneous sampling or continued  dedicated follow-up. Questioned 1.3 x 1.1 x 0.8 cm hypoechoic ill-defined nodule within the inferior pole of the left lobe of the thyroid  (labeled 4) is favored to represent a pseudonodule as it lacks defined borders on both provided transverse and sagittal images, and as confirmed on provided cine series 2. IMPRESSION: 1. Slightly atrophic and moderately heterogeneous appearing thyroid  without worrisome nodule or mass. Findings are nonspecific though could be seen in the setting of a thyroiditis. 2. None of the discretely measured thyroid  nodules/pseudo nodules meet imaging criteria to recommend percutaneous sampling or continued dedicated follow-up. The  above is in keeping with the ACR TI-RADS recommendations - J Am Coll Radiol 2017;14:587-595. Electronically Signed   By: Norleen Roulette M.D.   On: 12/10/2023 11:56    Assessment & Plan:   Problem List Items Addressed This Visit     Antiphospholipid antibody syndrome (HCC)   ASA for travel      Constipation   Use Colace or Pericolace daily      History of colon cancer   Serum markers - per Dr Cloretta q 12 mo Colon is due soon - Dr Leigh      Relevant Orders   VITAMIN D  25 Hydroxy (Vit-D Deficiency, Fractures)   Vitamin B12   History of secondary colorectal cancer   Markers w/DrSherrill      Hyperthyroidism   Endocrinology -- seeing Dr Mercie - labs w/him On Methimizole      Relevant Orders   VITAMIN D  25 Hydroxy (Vit-D Deficiency, Fractures)   Vitamin B12   Microalbumin / creatinine urine ratio   Vitamin D  deficiency   Vit D      Well adult exam - Primary   We discussed age appropriate health related issues, including available/recomended screening tests and vaccinations. Labs were ordered to be later reviewed . All questions were answered. We discussed one or more of the following - seat belt use, use of sunscreen/sun exposure exercise, fall risk reduction, second hand smoke exposure, firearm use and storage, seat belt  use, a need for adhering to healthy diet and exercise. Needs mammo/PAP, ophth appt - she will schedule Labs were ordered.  All questions were answered. A cardiac CT scan for calcium  scoring offered Declined all shots       Relevant Orders   Urinalysis   CBC with Differential/Platelet   Lipid panel   Comprehensive metabolic panel with GFR   VITAMIN D  25 Hydroxy (Vit-D Deficiency, Fractures)   Vitamin B12   Microalbumin / creatinine urine ratio      No orders of the defined types were placed in this encounter.     Follow-up: Return in about 1 year (around 07/12/2025) for Wellness Exam.  Marolyn Noel, MD

## 2024-07-12 NOTE — Assessment & Plan Note (Signed)
 Serum markers - per Dr Cloretta q 12 mo Colon is due soon - Dr Leigh

## 2024-07-15 ENCOUNTER — Ambulatory Visit: Payer: Self-pay | Admitting: Internal Medicine

## 2024-08-02 ENCOUNTER — Encounter: Payer: Self-pay | Admitting: Gastroenterology

## 2024-08-17 ENCOUNTER — Other Ambulatory Visit

## 2024-08-17 LAB — T4, FREE: Free T4: 1 ng/dL (ref 0.8–1.8)

## 2024-08-17 LAB — T3, FREE: T3, Free: 2.8 pg/mL (ref 2.3–4.2)

## 2024-08-17 LAB — TSH: TSH: 5.56 m[IU]/L — ABNORMAL HIGH (ref 0.40–4.50)

## 2024-08-20 ENCOUNTER — Ambulatory Visit: Payer: Self-pay | Admitting: Endocrinology

## 2024-08-22 ENCOUNTER — Encounter: Payer: Self-pay | Admitting: Endocrinology

## 2024-08-22 ENCOUNTER — Ambulatory Visit: Admitting: Endocrinology

## 2024-08-22 VITALS — BP 112/80 | HR 76 | Resp 20 | Ht 66.0 in | Wt 133.6 lb

## 2024-08-22 DIAGNOSIS — E041 Nontoxic single thyroid nodule: Secondary | ICD-10-CM

## 2024-08-22 DIAGNOSIS — E0500 Thyrotoxicosis with diffuse goiter without thyrotoxic crisis or storm: Secondary | ICD-10-CM | POA: Diagnosis not present

## 2024-08-22 DIAGNOSIS — E0590 Thyrotoxicosis, unspecified without thyrotoxic crisis or storm: Secondary | ICD-10-CM

## 2024-08-22 MED ORDER — METHIMAZOLE 5 MG PO TABS: 2.5000 mg | ORAL_TABLET | Freq: Every day | ORAL | 3 refills | Status: AC

## 2024-08-22 NOTE — Progress Notes (Signed)
 Outpatient Endocrinology Note [place] Stefanie Hodgens, MD  08/22/24  Patient's Name: Priscilla Hill    DOB: [birth year]    MRN: [id number]  REASON OF VISIT: Follow up for hyperthyroidism / Graves disease  REFERRING PROVIDER: Garald Karlynn LULLA, MD   PCP: Garald Karlynn LULLA, MD  HISTORY OF PRESENT ILLNESS:   Priscilla Hill is a 58 y.o. old female with past medical history as listed below is presented for follow-up of hyperthyroidism / Graves' disease.   Pertinent Thyroid  History: Patient had symptoms of dry cough, throat pain along with not feeling good without symptoms of anxiety, mood change, weight loss, hot flashes, low energy, insomnia which was started around June/July 2024.  She had sore throat, loose bowel movement and hand tremors as well at that time.  She was evaluated by primary care provider in August 2024 and had thyroid  function test, on August 19, 20th 2024 with suppressed TSH and elevated free T4 of 2.07 and free T3 of 5.5.  She had normal TSH of 1.43 in August 2023, and referred to endocrinology.  Patient was initially evaluated in November 2024, she continued to have elevated thyroid  hormone levels along with positive TSI, normal TRAB, mildly elevated anti-TPO and thyroglobulin antibody overall possibley consistent with autoimmune hyperthyroidism/Graves' disease.    Patient was planned for methimazole  5 mg daily in November 2024.  Patient did not start.  Patient continued to have elevated thyroid  hormone levels consistent with hyperthyroidism, worsening and methimazole  5 mg daily was restarted in January 2025.  No family history of thyroid  disorder.   Labs:   Latest Reference Range & Units 10/04/23 08:42  Thyroglobulin Ab < or = 1 IU/mL 12 (H)  Thyroperoxidase Ab SerPl-aCnc <9 IU/mL 71 (H)  THYROID  STIMULATING IMMUNOGLOBULIN  Rpt !  TSI <140 % baseline 313 (H)  (H): Data is abnormally high !: Data is abnormal Rpt: View report in Results Review for more information    Latest Reference Range & Units 10/04/23 08:42  TRAB <=2.00 IU/L 1.24    # December 08, 2023 ultrasound thyroid  : overall heterogenous thyroid  parenchyma, with ill-defined hypoechoic area likely pseudonodules.    Interval history  Patient has been taking methimazole  5 mg daily.  She has complaints of increased hair loss, tried minoxidil however he stopped later and currently taking biotin.  She stopped taking biotin 3 days prior to doing recent thyroid  lab.  Recent thyroid  lab with mildly elevated TSH and normal free T4 and free T3 as follows.  She has no other complaints today.  Discussed that hair loss can be contributed by having thyroid  disorder.  However it has multiple causes to have hair loss.  After optimizing thyroid  hormone levels if she continued to have significant hair loss consider dermatology evaluation.  Latest Reference Range & Units 08/17/24 08:04  TSH 0.40 - 4.50 mIU/L 5.56 (H)  (H): Data is abnormally high   REVIEW OF SYSTEMS:  As per history of present illness.   PAST MEDICAL HISTORY: Past Medical History:  Diagnosis Date   Anemia    Anxiety    Arthritis    Cancer (HCC)    GERD (gastroesophageal reflux disease)    H. pylori infection    positive IgG serology   History of multiple miscarriages    History of positive PPD    never any symptoms, due to being from [place]? different vaccine   Hyperlipidemia    mild   Hyperthyroidism     PAST SURGICAL HISTORY: Past Surgical History:  Procedure Laterality Date   childbirth   04-17-13   x 2 NVD   ESSURE TUBAL LIGATION     HYSTEROSCOPY WITH RESECTOSCOPE  04-17-13   fibroids removed   LAPAROSCOPIC PARTIAL COLECTOMY N/A 04/24/2013   Procedure: LAPAROSCOPIC ASSISTED SIGMOID COLECTOMY;  Surgeon: Krystal JINNY Russell, MD;  Location: [facility];  Service: General;  Laterality: N/A;   PORTACATH PLACEMENT Right 05/24/2013   Procedure: INSERTION PORT-A-CATH;  Surgeon: Krystal JINNY Russell, MD;  Location: [facility];  Service: General;   Laterality: Right;    ALLERGIES: No Known Allergies  FAMILY HISTORY:  Family History  Problem Relation Age of Onset   Hypertension Mother    Diabetes Mother    Heart disease Mother    Stroke Father 17   Hypertension Father    Heart disease Maternal Grandmother    Heart disease Maternal Grandfather    Stomach cancer Other        grandmother   Healthy Daughter    Healthy Daughter    Colon cancer Neg Hx     SOCIAL HISTORY: Social History   Socioeconomic History   Marital status: Married    Spouse name: Franky   Number of children: 2   Years of education: Not on file   Highest education level: Not on file  Occupational History   Occupation: Dietary Event organiser: [email or web address]     Comment: [employer]  Tobacco Use   Smoking status: Never    Passive exposure: Never   Smokeless tobacco: Never  Vaping Use   Vaping status: Never Used  Substance and Sexual Activity   Alcohol use: Yes    Alcohol/week: 0.0 standard drinks of alcohol    Comment: occasionally   Drug use: No   Sexual activity: Yes  Other Topics Concern   Not on file  Social History Narrative   Married, husband Franky   #1 small 6 yo in home and cares for her 84 year old mother   Both her and husband have #2 grown children each from prior marriage-her's are in [place]   Dietary Production designer, theatre/television/film at [employer] in [place], [place]   Independent in ADL's and able to drive   Speaks English well   Social Drivers of Health   Financial Resource Strain: Not on file  Food Insecurity: Not on file  Transportation Needs: Not on file  Physical Activity: Not on file  Stress: Not on file  Social Connections: Not on file    MEDICATIONS:  Current Outpatient Medications  Medication Sig Dispense Refill   b complex vitamins tablet Take 1 tablet by mouth daily. 100 tablet 3   Biotin 89999 MCG TABS Take by mouth daily.     glucosamine-chondroitin (MAX GLUCOSAMINE CHONDROITIN) 500-400 MG tablet Take 1  tablet by mouth 3 (three) times daily. 100 tablet 3   Omega-3 Fatty Acids (OMEGA 3 PO) Take by mouth daily.     omeprazole  (PRILOSEC) 40 MG capsule Take 1 capsule (40 mg total) by mouth daily. (Patient taking differently: Take 40 mg by mouth as needed.) 90 capsule 1   OVER THE COUNTER MEDICATION Take 1 tablet by mouth daily. Vitamin D3 + K2 (10 units) per patient     TURMERIC PO Take by mouth daily.     methimazole  (TAPAZOLE ) 5 MG tablet Take 0.5 tablets (2.5 mg total) by mouth daily. 45 tablet 3   No current facility-administered medications for this visit.    PHYSICAL EXAM: Vitals:   08/22/24  0806  BP: 112/80  Pulse: 76  Resp: 20  SpO2: 98%  Weight: 133 lb 9.6 oz (60.6 kg)  Height: 5' 6 (1.676 m)     Body mass index is 21.56 kg/m.  Wt Readings from Last 3 Encounters:  08/22/24 133 lb 9.6 oz (60.6 kg)  07/12/24 130 lb 3.2 oz (59.1 kg)  05/09/24 133 lb 6.4 oz (60.5 kg)    General: Well developed, well nourished female in no apparent distress.  HEENT: AT/, no external lesions. Hearing intact to the spoken word Eyes: EOMI. No stare, proptosis or lid lag. Conjunctiva clear and no icterus. No erythema or watering Neck: Trachea midline, neck supple without appreciable thyromegaly or lymphadenopathy and no palpable thyroid  nodules Lungs: Clear to auscultation, no wheeze. Respirations not labored Heart: S1S2, Regular in rate and rhythm.  Abdomen: Soft, non tender Neurologic: Alert, oriented, normal speech, deep tendon biceps reflexes normal,  no gross focal neurological deficit Extremities: No pedal pitting edema, no tremors of outstretched hands Skin: Warm, color good.  Psychiatric: Does not appear depressed.SABRA  PERTINENT HISTORIC LABORATORY AND IMAGING STUDIES:  All pertinent laboratory results were reviewed. Please see HPI also for further details.   TSH  Date Value Ref Range Status  08/17/2024 5.56 (H) 0.40 - 4.50 mIU/L Final  05/04/2024 2.29 0.40 - 4.50 mIU/L Final   02/02/2024 0.01 (L) 0.40 - 4.50 mIU/L Final    Lab Results  Component Value Date   FREET4 1.0 08/17/2024   FREET4 1.0 05/04/2024   FREET4 1.1 02/02/2024   T3FREE 2.8 08/17/2024   T3FREE 2.7 05/04/2024   T3FREE 3.2 02/02/2024   TSH 5.56 (H) 08/17/2024   TSH 2.29 05/04/2024   TSH 0.01 (L) 02/02/2024    No results found for: THYROTRECAB  Lab Results  Component Value Date   TSH 5.56 (H) 08/17/2024   TSH 2.29 05/04/2024   TSH 0.01 (L) 02/02/2024   FREET4 1.0 08/17/2024   FREET4 1.0 05/04/2024   FREET4 1.1 02/02/2024     Lab Results  Component Value Date   TSI 313 (H) 10/04/2023     No components found for: TRAB    ASSESSMENT / PLAN  1. Graves disease   2. Hyperthyroidism   3. Thyroid  nodule     -Patient has hyperthyroidism starting in August 2024.  Patient had positive TSI, anti-TPO antibody, thyroglobulin antibody and normal TRAb.  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   -Patient has been taking methimazole  5 mg daily since January 2025. -Ultrasound thyroid  in July 2025 with ill-defined nodule of the area likely pseudonodules, will consider repeat ultrasound in the future.  Plan: - Recent lab with mildly elevated TSH.Reduce methimazole  from 5 to 2.5 mg daily. -Will check thyroid  function test TSH, free T4, free T3 prior to follow-up visit in 2 months.  Diagnoses and all orders for this visit:  Graves disease -     methimazole  (TAPAZOLE ) 5 MG tablet; Take 0.5 tablets (2.5 mg total) by mouth daily. -     T4, free -     T3, free -     TSH  Hyperthyroidism -     methimazole  (TAPAZOLE ) 5 MG tablet; Take 0.5 tablets (2.5 mg total) by mouth daily.  Thyroid  nodule    DISPOSITION Follow up in clinic in 2 months suggested.  Labs prior to follow-up visit.  All questions answered and patient verbalized understanding of the plan.  [place] Kerie Badger, MD [facility] Endocrinology [facility] [address] [place],  [address] [place], [place] [address] Phone # [phone]   At least part of this note was generated using voice recognition software. Inadvertent word errors may have occurred, which were not recognized during the proofreading process.

## 2024-09-26 ENCOUNTER — Encounter: Payer: Self-pay | Admitting: Gastroenterology

## 2024-09-26 ENCOUNTER — Ambulatory Visit: Admitting: Gastroenterology

## 2024-09-26 VITALS — BP 120/64 | HR 77 | Ht 66.0 in | Wt 134.1 lb

## 2024-09-26 DIAGNOSIS — C187 Malignant neoplasm of sigmoid colon: Secondary | ICD-10-CM | POA: Diagnosis not present

## 2024-09-26 DIAGNOSIS — E0590 Thyrotoxicosis, unspecified without thyrotoxic crisis or storm: Secondary | ICD-10-CM

## 2024-09-26 DIAGNOSIS — R194 Change in bowel habit: Secondary | ICD-10-CM | POA: Diagnosis not present

## 2024-09-26 DIAGNOSIS — Z85038 Personal history of other malignant neoplasm of large intestine: Secondary | ICD-10-CM

## 2024-09-26 MED ORDER — NA SULFATE-K SULFATE-MG SULF 17.5-3.13-1.6 GM/177ML PO SOLN: 1.0000 | Freq: Once | ORAL | 0 refills | Status: AC

## 2024-09-26 NOTE — Progress Notes (Signed)
 Agree with assessment and plan as outlined.  I would think it very unlikely colonoscopy will show any high risk lesion/malignancy, however given her history and her bowel changes, if this would provide peace of mind then okay to proceed with colonoscopy.  Thank you

## 2024-09-26 NOTE — Patient Instructions (Addendum)
 VISIT SUMMARY:  Today, we discussed your recent onset of constipation, which you believe is related to your thyroid  medication, methimazole . We also reviewed your history of hemorrhoids and previous colonoscopy results. You mentioned experiencing right-sided abdominal discomfort and significant weight loss, which you attribute to your thyroid  condition and dietary changes.  YOUR PLAN:  -CONSTIPATION: Constipation is when you have infrequent or difficult bowel movements. It is likely related to your hypothyroidism and possibly worsened by your thyroid  medication, methimazole . Since you did not tolerate fiber supplements well, I recommend trying docusate sodium (Colace) to soften your stools. I also provided you with a printout of other options, including fiber supplements, Miralax, and stool softeners.  -HYPOTHYROIDISM: Hypothyroidism is when your thyroid  gland does not produce enough thyroid  hormone, which can slow down many of your body's functions, including digestion. Your TSH level is 5.5, indicating that your thyroid  function is not optimal. We will continue to monitor your thyroid  function and adjust your medication as needed.  -COLONOSCOPY: A colonoscopy is a procedure to examine the inside of your colon. It is scheduled to rule out any colonic causes of your constipation. Your previous colonoscopy in 2021 was normal except for hemorrhoids. We discussed the low risks associated with this procedure.  INSTRUCTIONS:  Please schedule your colonoscopy with Dr. Leigh. Continue taking your thyroid  medication and monitor your symptoms. If you experience any new or worsening symptoms, please contact our office.  We have sent the following medications to your pharmacy for you to pick up at your convenience: Suprep  You have been scheduled for a colonoscopy. Please follow written instructions given to you at your visit today.   If you use inhalers (even only as needed), please bring them with  you on the day of your procedure.  DO NOT TAKE 7 DAYS PRIOR TO TEST- Trulicity (dulaglutide) Ozempic, Wegovy (semaglutide) Mounjaro (tirzepatide) Bydureon Bcise (exanatide extended release)  DO NOT TAKE 1 DAY PRIOR TO YOUR TEST Rybelsus (semaglutide) Adlyxin (lixisenatide) Victoza (liraglutide) Byetta (exanatide) ___________________________________________________________________________   It was a pleasure to see you today!  Thank you for trusting me with your gastrointestinal care!

## 2024-09-26 NOTE — Progress Notes (Signed)
 Chief Complaint: change in bowel habits Primary GI MD: Dr. Leigh  HPI: 58 year old female history of colon cancer diagnosed in 2014, H. pylori, presents for change in bowel habits  Her colonoscopy in 2015 was normal. Dr. Leigh performed her colonoscopy in June 2018, she was noted to have 2 polyps removed, consistent with sessile serrated adenomas, as well as hypertrophied anal papilla but no other significant pathology. Repeat exam in 5 years was recommended.    Patient then saw Dr. Leigh in 2021 for rectal bleeding and underwent early colonoscopy which showed patent end-to-end colocolonic anastomosis, and internal hemorrhoids thought to be the source of her rectal bleeding  Discussed the use of AI scribe software for clinical note transcription with the patient, who gave verbal consent to proceed.  History of Present Illness   She has been experiencing new onset constipation, which she associates with the initiation of methimazole  for her thyroid  condition. Bowel movements occur almost daily but are difficult to pass. Dietary modifications, including increased fiber intake through vegetables, have provided minimal improvement. She has tried fiber supplements in the past but did not tolerate them well due to bloating.  She has a history of rectal bleeding and underwent a colonoscopy in 2021, which was unremarkable except for the presence of hemorrhoids.  She reports a significant weight loss of 18 pounds, which she attributes to her thyroid  condition and intentional dietary changes.  She was recently diagnosed with Graves' disease which was the initial cause of her weight loss and has been being managed with that with last TSH 08/17/2024 showing hypothyroidism.  She reports right-sided abdominal discomfort that improves after bowel movements. No nausea or vomiting.  Wt Readings from Last 3 Encounters:  09/26/24 134 lb 2 oz (60.8 kg)  08/22/24 133 lb 9.6 oz (60.6 kg)   07/12/24 130 lb 3.2 oz (59.1 kg)     PREVIOUS GI WORKUP   Prior endoscopic evaluationColonoscopy 03/2013 - sigmoid adenocarcinoma Colonoscopy 03/2014 - normal, no adenomas   EGD 01/09/16 - The esophagus was normal. DH, GEJ, and SCJ located 39cm from the incisors. The stomach was remarkable for some old heme and patchy erythema in the antrum and distal body. No focal ulcerations or erosions were noted. Biopsies were taken from the antrum and body to rule out H pylori. The duodenal bulb and 2nd portion of the duodenum were normal. Retroflexed views revealed no abnormalities.   Colonoscopy 05/20/17 - The perianal and digital rectal examinations were normal. - The terminal ileum appeared normal. - A 8 mm polyp was found in the ascending colon. The polyp was flat. The polyp was removed with a cold snare. Resection and retrieval were complete. - A 5 mm polyp was found in the transverse colon. The polyp was flat. The polyp was removed with a cold snare. Resection and retrieval were complete. - There was evidence of a prior end-to-end colo-colonic anastomosis in the sigmoid colon. This was patent and was characterized by healthy appearing mucosa. - Anal papilla(e) were hypertrophied. - The exam was otherwise without abnormality.   Surgical [P], ascending, transverse, polyps (2) - SESSILE SERRATED POLYP (FIVE FRAGMENTS). - NO HIGH GRADE DYSPLASIA OR MALIGNANCY.  Colonoscopy 07/2020 rectal bleeding - The examined portion of the ileum was normal.  - Patent end- to- end colo- colonic anastomosis, characterized by healthy appearing mucosa.- Anal papilla( e) were hypertrophied.  - Internal hemorrhoids.  - The examination was otherwise normal.  - No polyps.  - recall 5 years (07/2025)  Past  Medical History:  Diagnosis Date   Anemia    Anxiety    Arthritis    Cancer (HCC)    GERD (gastroesophageal reflux disease)    H. pylori infection    positive IgG serology   History of multiple  miscarriages    History of positive PPD    never any symptoms, due to being from [place]? different vaccine   Hyperlipidemia    mild   Hyperthyroidism     Past Surgical History:  Procedure Laterality Date   childbirth   04-17-13   x 2 NVD   ESSURE TUBAL LIGATION     HYSTEROSCOPY WITH RESECTOSCOPE  04-17-13   fibroids removed   LAPAROSCOPIC PARTIAL COLECTOMY N/A 04/24/2013   Procedure: LAPAROSCOPIC ASSISTED SIGMOID COLECTOMY;  Surgeon: Krystal JINNY Russell, MD;  Location: [facility];  Service: General;  Laterality: N/A;   PORTACATH PLACEMENT Right 05/24/2013   Procedure: INSERTION PORT-A-CATH;  Surgeon: Krystal JINNY Russell, MD;  Location: [facility];  Service: General;  Laterality: Right;    Current Outpatient Medications  Medication Sig Dispense Refill   b complex vitamins tablet Take 1 tablet by mouth daily. 100 tablet 3   Biotin 89999 MCG TABS Take by mouth daily.     glucosamine-chondroitin (MAX GLUCOSAMINE CHONDROITIN) 500-400 MG tablet Take 1 tablet by mouth 3 (three) times daily. 100 tablet 3   methimazole  (TAPAZOLE ) 5 MG tablet Take 0.5 tablets (2.5 mg total) by mouth daily. 45 tablet 3   Na Sulfate-K Sulfate-Mg Sulfate concentrate (SUPREP) 17.5-3.13-1.6 GM/177ML SOLN Take 1 kit (354 mLs total) by mouth once for 1 dose. 354 mL 0   Omega-3 Fatty Acids (OMEGA 3 PO) Take by mouth daily.     omeprazole  (PRILOSEC) 40 MG capsule Take 1 capsule (40 mg total) by mouth daily. (Patient taking differently: Take 40 mg by mouth as needed.) 90 capsule 1   OVER THE COUNTER MEDICATION Take 1 tablet by mouth daily. Vitamin D3 + K2 (10 units) per patient     TURMERIC PO Take by mouth daily.     No current facility-administered medications for this visit.    Allergies as of 09/26/2024   (No Known Allergies)    Family History  Problem Relation Age of Onset   Hypertension Mother    Diabetes Mother    Heart disease Mother    Stroke Father 47   Hypertension Father    Heart disease Maternal Grandmother     Heart disease Maternal Grandfather    Stomach cancer Other        grandmother   Healthy Daughter    Healthy Daughter    Colon cancer Neg Hx     Social History   Socioeconomic History   Marital status: Married    Spouse name: Franky   Number of children: 2   Years of education: Not on file   Highest education level: Not on file  Occupational History   Occupation: Dietary Event Organiser: [email or web address]     Comment: [employer]  Tobacco Use   Smoking status: Never    Passive exposure: Never   Smokeless tobacco: Never  Vaping Use   Vaping status: Never Used  Substance and Sexual Activity   Alcohol use: Yes    Alcohol/week: 0.0 standard drinks of alcohol    Comment: occasionally   Drug use: No   Sexual activity: Yes  Other Topics Concern   Not on file  Social History Narrative   Married, husband Franky   #  1 small 6 yo in home and cares for her 40 year old mother   Both her and husband have #2 grown children each from prior marriage-her's are in [place]   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[place], [place]   Independent in ADL's and able to drive   Speaks English well   Social Drivers of Health   Financial Resource Strain: Not on file  Food Insecurity: Not on file  Transportation Needs: Not on file  Physical Activity: Not on file  Stress: Not on file  Social Connections: Not on file  Intimate Partner Violence: Not on file    Review of Systems:    Constitutional: No weight loss, fever, chills, weakness or fatigue HEENT: Eyes: No change in vision               Ears, Nose, Throat:  No change in hearing or congestion Skin: No rash or itching Cardiovascular: No chest pain, chest pressure or palpitations   Respiratory: No SOB or cough Gastrointestinal: See HPI and otherwise negative Genitourinary: No dysuria or change in urinary frequency Neurological: No headache, dizziness or syncope Musculoskeletal: No new muscle or joint pain Hematologic: No  bleeding or bruising Psychiatric: No history of depression or anxiety    Physical Exam:  Vital signs: BP 120/64   Pulse 77   Ht 5' 6 (1.676 m)   Wt 134 lb 2 oz (60.8 kg)   LMP  (LMP Unknown)   BMI 21.65 kg/m   Constitutional: NAD, alert and cooperative Head:  Normocephalic and atraumatic. Eyes:   PEERL, EOMI. No icterus. Conjunctiva pink. Respiratory: Respirations even and unlabored. Lungs clear to auscultation bilaterally.   No wheezes, crackles, or rhonchi.  Cardiovascular:  Regular rate and rhythm. No peripheral edema, cyanosis or pallor.  Gastrointestinal:  Soft, nondistended, nontender. No rebound or guarding. Normal bowel sounds. No appreciable masses or hepatomegaly. Rectal:  Declines Msk:  Symmetrical without gross deformities. Without edema, no deformity or joint abnormality.  Neurologic:  Alert and  oriented x4;  grossly normal neurologically.  Skin:   Dry and intact without significant lesions or rashes. Psychiatric: Oriented to person, place and time. Demonstrates good judgement and reason without abnormal affect or behaviors.  RELEVANT LABS AND IMAGING: CBC    Component Value Date/Time   WBC 5.0 07/12/2024 0830   RBC 4.45 07/12/2024 0830   HGB 13.4 07/12/2024 0830   HGB 11.9 12/10/2013 1155   HCT 40.4 07/12/2024 0830   HCT 35.6 12/10/2013 1155   PLT 244.0 07/12/2024 0830   PLT 147 12/10/2013 1155   MCV 90.9 07/12/2024 0830   MCV 97.4 12/10/2013 1155   MCH 31.3 04/22/2022 1512   MCHC 33.3 07/12/2024 0830   RDW 13.2 07/12/2024 0830   RDW 13.4 12/10/2013 1155   LYMPHSABS 1.5 07/12/2024 0830   LYMPHSABS 1.5 12/10/2013 1155   MONOABS 0.3 07/12/2024 0830   MONOABS 0.6 12/10/2013 1155   EOSABS 0.1 07/12/2024 0830   EOSABS 0.1 12/10/2013 1155   BASOSABS 0.0 07/12/2024 0830   BASOSABS 0.0 12/10/2013 1155    CMP     Component Value Date/Time   NA 142 07/12/2024 0830   NA 144 03/24/2016 0928   K 3.5 07/12/2024 0830   K 3.8 03/24/2016 0928   CL 104  07/12/2024 0830   CO2 30 07/12/2024 0830   CO2 28 03/24/2016 0928   GLUCOSE 97 07/12/2024 0830   GLUCOSE 82 03/24/2016 0928   BUN 9 07/12/2024 0830  BUN 11.5 03/24/2016 0928   CREATININE 0.77 07/12/2024 0830   CREATININE 1.00 04/22/2022 1512   CREATININE 0.8 03/24/2016 0928   CALCIUM  9.1 07/12/2024 0830   CALCIUM  9.9 03/24/2016 0928   PROT 7.3 07/12/2024 0830   PROT 7.3 04/07/2015 0814   ALBUMIN 4.4 07/12/2024 0830   ALBUMIN 4.1 04/07/2015 0814   AST 21 07/12/2024 0830   AST 20 04/07/2015 0814   ALT 20 07/12/2024 0830   ALT 18 04/07/2015 0814   ALKPHOS 72 07/12/2024 0830   ALKPHOS 58 04/07/2015 0814   BILITOT 0.8 07/12/2024 0830   BILITOT 1.27 (H) 04/07/2015 0814   GFRNONAA 99 04/23/2021 0955   GFRAA 115 04/23/2021 0955     Assessment/Plan:   Change in bowel habits New onset constipation in the setting of being medicated for Graves' disease with recent TSH 5.56.  Suspect her change in bowel habits is likely due to to overactive thyroid  now becoming underactive through medication.  However patient is very nervous and would like a colonoscopy to evaluate for structural causes of constipation in light of her previous history of sigmoid adenocarcinoma.  She would like to do her colonoscopy early (due 07/2025). has minor abdominal discomfort that improves with bowel movements.  No nausea/vomiting.  Initial weight loss secondary to thyroid  which has since improved on medication. Patient prefers not to do fiber supplement - Colonoscopy for further evaluation of change in bowel habits -Trial of stool softener or MiraLAX to help with bowel movements.  She is unsure if she wants to try MiraLAX but is amenable to stool softener - I thoroughly discussed the procedure with the patient (at bedside) to include nature of the procedure, alternatives, benefits, and risks (including but not limited to bleeding, infection, perforation, anesthesia/cardiac pulmonary complications).  Patient verbalized  understanding and gave verbal consent to proceed with procedure.   sigmoid adenocarcinoma diagnosed in 2014, status post surgical resection and chemotherapy Colonoscopy 07/2020 with patent anastomosis and internal hemorrhoids with a recall of 5 years (07/2025)  Hyperthyroidism New dx. On methimazole  with recent TSH showing underactive thyroid    Gianmarco Roye Mollie RIGGERS [facility] 09/26/2024, 10:44 AM  Cc: Plotnikov, Karlynn GAILS, MD

## 2024-10-23 ENCOUNTER — Other Ambulatory Visit

## 2024-10-25 ENCOUNTER — Encounter: Payer: Self-pay | Admitting: Endocrinology

## 2024-10-25 ENCOUNTER — Other Ambulatory Visit

## 2024-10-25 ENCOUNTER — Ambulatory Visit: Admitting: Endocrinology

## 2024-10-25 ENCOUNTER — Ambulatory Visit: Payer: Self-pay | Admitting: Endocrinology

## 2024-10-25 VITALS — BP 100/62 | HR 91 | Resp 16 | Ht 66.0 in | Wt 134.8 lb

## 2024-10-25 DIAGNOSIS — E041 Nontoxic single thyroid nodule: Secondary | ICD-10-CM | POA: Diagnosis not present

## 2024-10-25 DIAGNOSIS — E0590 Thyrotoxicosis, unspecified without thyrotoxic crisis or storm: Secondary | ICD-10-CM

## 2024-10-25 DIAGNOSIS — E0500 Thyrotoxicosis with diffuse goiter without thyrotoxic crisis or storm: Secondary | ICD-10-CM

## 2024-10-25 LAB — TSH: TSH: 3.26 m[IU]/L (ref 0.40–4.50)

## 2024-10-25 LAB — T3, FREE: T3, Free: 2.6 pg/mL (ref 2.3–4.2)

## 2024-10-25 LAB — T4, FREE: Free T4: 1.1 ng/dL (ref 0.8–1.8)

## 2024-10-25 NOTE — Progress Notes (Unsigned)
 Office Visit Note  Patient: Priscilla Hill             Date of Birth: October 10, 1966           MRN: [id number]             PCP: Garald Karlynn LULLA, MD Referring: Garald Karlynn LULLA, MD Visit Date: 11/07/2024 Occupation: Data Unavailable  Subjective:  No chief complaint on file.   History of Present Illness: Priscilla Hill is a 58 y.o. female ***     Activities of Daily Living:  Patient reports morning stiffness for *** {minute/hour:19697}.   Patient {ACTIONS;DENIES/REPORTS:21021675::Denies} nocturnal pain.  Difficulty dressing/grooming: {ACTIONS;DENIES/REPORTS:21021675::Denies} Difficulty climbing stairs: {ACTIONS;DENIES/REPORTS:21021675::Denies} Difficulty getting out of chair: {ACTIONS;DENIES/REPORTS:21021675::Denies} Difficulty using hands for taps, buttons, cutlery, and/or writing: {ACTIONS;DENIES/REPORTS:21021675::Denies}  No Rheumatology ROS completed.   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   Well adult exam 11/14/2012   Hematochezia 11/14/2012   Paresthesia 11/14/2012   Dyslipidemia 11/14/2012   URI, acute 05/31/2011   Vaginitis 05/31/2011   Anxiety 04/17/2010   SHOULDER PAIN 04/17/2010   THROAT PAIN, CHRONIC 03/14/2009   Dysphagia 03/14/2009   Conjunctivitis 12/05/2008   Allergic rhinitis 10/08/2008   Nonspecific (abnormal) findings on radiological and other examination of body structure 10/08/2008   CHEST XRAY, ABNORMAL 10/08/2008   Acute bronchitis 09/24/2008   Cough 09/24/2008    Past Medical History:  Diagnosis Date   Anemia    Anxiety    Arthritis    Cancer (HCC)    GERD (gastroesophageal reflux disease)    H. pylori infection    positive IgG serology   History of multiple miscarriages    History of positive PPD    never any symptoms, due to being from [place]? different vaccine   Hyperlipidemia    mild   Hyperthyroidism     Family History  Problem Relation Age of Onset   Hypertension Mother    Diabetes Mother    Heart disease Mother    Stroke Father 38   Hypertension Father    Heart disease Maternal Grandmother    Heart disease Maternal Grandfather    Stomach cancer Other        grandmother   Healthy Daughter    Healthy Daughter    Colon cancer Neg Hx    Past Surgical History:  Procedure Laterality Date   childbirth   04-17-13   x  2 NVD   ESSURE TUBAL LIGATION     HYSTEROSCOPY WITH RESECTOSCOPE  04-17-13   fibroids removed   LAPAROSCOPIC PARTIAL COLECTOMY N/A 04/24/2013   Procedure: LAPAROSCOPIC ASSISTED SIGMOID COLECTOMY;  Surgeon: Krystal JINNY Russell, MD;  Location: [facility];  Service: General;  Laterality: N/A;   PORTACATH PLACEMENT Right 05/24/2013   Procedure: INSERTION PORT-A-CATH;  Surgeon: Krystal JINNY Russell, MD;  Location: [facility];  Service: General;  Laterality: Right;   Social  History   Tobacco Use   Smoking status: Never    Passive exposure: Never   Smokeless tobacco: Never  Vaping Use   Vaping status: Never Used  Substance Use Topics   Alcohol use: Yes    Alcohol/week: 0.0 standard drinks of alcohol    Comment: occasionally   Drug use: No   Social History   Social History Narrative   Married, husband Franky   #1 small 6 yo in home and cares for her 55 year old mother   Both her and husband have #2 grown children each from prior marriage-her's are in [place]   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 Go to the Rheumatology activity and complete the homunculus joint exam.  Investigation: No additional findings.  Imaging: No results found.  Recent Labs: Lab Results  Component Value Date   WBC 5.0 07/12/2024   HGB 13.4 07/12/2024   PLT 244.0 07/12/2024   NA 142 07/12/2024   K 3.5 07/12/2024   CL 104 07/12/2024   CO2 30 07/12/2024   GLUCOSE 97 07/12/2024   BUN 9 07/12/2024   CREATININE 0.77 07/12/2024   BILITOT 0.8 07/12/2024   ALKPHOS 72 07/12/2024   AST 21 07/12/2024   ALT 20 07/12/2024   PROT 7.3 07/12/2024   ALBUMIN 4.4 07/12/2024   CALCIUM  9.1 07/12/2024   GFRAA 115 04/23/2021    Speciality Comments: No specialty comments available.  Procedures:  No procedures performed Allergies: Patient has no known allergies.   Assessment / Plan:     Visit  Diagnoses: No diagnosis found.  Orders: No orders of the defined types were placed in this encounter.  No orders of the defined types were placed in this encounter.   Face-to-face time spent with patient was *** minutes. Greater than 50% of time was spent in counseling and coordination of care.  Follow-Up Instructions: No follow-ups on file.   Daved JAYSON Gavel, CMA  Note - This record has been created using Animal nutritionist.  Chart creation errors have been sought, but may not always  have been located. Such creation errors do not reflect on  the standard of medical care.

## 2024-10-25 NOTE — Progress Notes (Addendum)
 Outpatient Endocrinology Note Ebubechukwu Jedlicka, MD  10/25/24  Patient's Name: Priscilla Hill    DOB: 1966/04/25    MRN: [id number]  REASON OF VISIT: Follow up for hyperthyroidism / Graves disease  REFERRING PROVIDER: Garald Karlynn LULLA, MD   PCP: Garald Karlynn LULLA, MD  HISTORY OF PRESENT ILLNESS:   Priscilla Hill is a 57 y.o. old female with past medical history as listed below is presented for follow-up of hyperthyroidism / Graves' disease.   Pertinent Thyroid  History: Patient had symptoms of dry cough, throat pain along with not feeling good without symptoms of anxiety, mood change, weight loss, hot flashes, low energy, insomnia which was started around June/July 2024.  She had sore throat, loose bowel movement and hand tremors as well at that time.  She was evaluated by primary care provider in August 2024 and had thyroid  function test, on August 19, 20th 2024 with suppressed TSH and elevated free T4 of 2.07 and free T3 of 5.5.  She had normal TSH of 1.43 in August 2023, and referred to endocrinology.  Patient was initially evaluated in November 2024, she continued to have elevated thyroid  hormone levels along with positive TSI, normal TRAB, mildly elevated anti-TPO and thyroglobulin antibody overall possibley consistent with autoimmune hyperthyroidism/Graves' disease.    Patient was planned for methimazole  5 mg daily in November 2024.  Patient did not start.  Patient continued to have elevated thyroid  hormone levels consistent with hyperthyroidism, worsening and methimazole  5 mg daily was restarted in January 2025.  Methimazole  was decreased to 2.5 mg daily in September 2025.  No family history of thyroid  disorder.   Labs:  Latest Reference Range & Units 10/04/23 08:42  Thyroglobulin Ab < or = 1 IU/mL 12 (H)  Thyroperoxidase Ab SerPl-aCnc <9 IU/mL 71 (H)  THYROID  STIMULATING IMMUNOGLOBULIN  Rpt !  TSI <140 % baseline 313 (H)  (H): Data is abnormally high !: Data is abnormal Rpt:  View report in Results Review for more information   Latest Reference Range & Units 10/04/23 08:42  TRAB <=2.00 IU/L 1.24    # December 08, 2023 ultrasound thyroid  : overall heterogenous thyroid  parenchyma, with ill-defined hypoechoic area likely pseudonodules.    Interval history  Patient has been taking methimazole  2.5 mg daily.  She denies palpitation and heat intolerance, no cold intolerance.  She is having constipation lately and using magnesium citrate.  Body weight is stable.  She complains of lateral right and posterior neck pain, discussed that is unlikely to be related to thyroid  disorder.  She has been taking biotin and she forgot to hold biotin prior to lab test.  She collected lab on December 2, results still not available.  Talked with lab and they are running the test this morning.  No other complaints today.  REVIEW OF SYSTEMS:  As per history of present illness.   PAST MEDICAL HISTORY: Past Medical History:  Diagnosis Date   Anemia    Anxiety    Arthritis    Cancer (HCC)    GERD (gastroesophageal reflux disease)    H. pylori infection    positive IgG serology   History of multiple miscarriages    History of positive PPD    never any symptoms, due to being from [place]? different vaccine   Hyperlipidemia    mild   Hyperthyroidism     PAST SURGICAL HISTORY: Past Surgical History:  Procedure Laterality Date   childbirth   04-17-13   x 2 NVD   ESSURE  TUBAL LIGATION     HYSTEROSCOPY WITH RESECTOSCOPE  04-17-13   fibroids removed   LAPAROSCOPIC PARTIAL COLECTOMY N/A 04/24/2013   Procedure: LAPAROSCOPIC ASSISTED SIGMOID COLECTOMY;  Surgeon: Krystal JINNY Russell, MD;  Location: [facility];  Service: General;  Laterality: N/A;   PORTACATH PLACEMENT Right 05/24/2013   Procedure: INSERTION PORT-A-CATH;  Surgeon: Krystal JINNY Russell, MD;  Location: [facility];  Service: General;  Laterality: Right;    ALLERGIES: No Known Allergies  FAMILY HISTORY:  Family History  Problem  Relation Age of Onset   Hypertension Mother    Diabetes Mother    Heart disease Mother    Stroke Father 59   Hypertension Father    Heart disease Maternal Grandmother    Heart disease Maternal Grandfather    Stomach cancer Other        grandmother   Healthy Daughter    Healthy Daughter    Colon cancer Neg Hx     SOCIAL HISTORY: Social History   Socioeconomic History   Marital status: Married    Spouse name: Franky   Number of children: 2   Years of education: Not on file   Highest education level: Not on file  Occupational History   Occupation: Dietary Event Organiser: [email or web address]     Comment: [employer]  Tobacco Use   Smoking status: Never    Passive exposure: Never   Smokeless tobacco: Never  Vaping Use   Vaping status: Never Used  Substance and Sexual Activity   Alcohol use: Yes    Alcohol/week: 0.0 standard drinks of alcohol    Comment: occasionally   Drug use: No   Sexual activity: Yes  Other Topics Concern   Not on file  Social History Narrative   Married, husband Franky   #1 small 6 yo in home and cares for her 60 year old mother   Both her and husband have #2 grown children each from prior marriage-her's are in [place]   Dietary production designer, theatre/television/film at [employer] in [place], [place]   Independent in ADL's and able to drive   Speaks English well   Social Drivers of Health   Financial Resource Strain: Not on file  Food Insecurity: Not on file  Transportation Needs: Not on file  Physical Activity: Not on file  Stress: Not on file  Social Connections: Not on file    MEDICATIONS:  Current Outpatient Medications  Medication Sig Dispense Refill   Biotin 89999 MCG TABS Take by mouth daily.     glucosamine-chondroitin (MAX GLUCOSAMINE CHONDROITIN) 500-400 MG tablet Take 1 tablet by mouth 3 (three) times daily. 100 tablet 3   magnesium 30 MG tablet Take by mouth daily.     methimazole  (TAPAZOLE ) 5 MG tablet Take 0.5 tablets (2.5 mg total) by  mouth daily. 45 tablet 3   Omega-3 Fatty Acids (OMEGA 3 PO) Take by mouth daily.     TURMERIC PO Take by mouth daily.     b complex vitamins tablet Take 1 tablet by mouth daily. (Patient not taking: Reported on 10/25/2024) 100 tablet 3   omeprazole  (PRILOSEC) 40 MG capsule Take 1 capsule (40 mg total) by mouth daily. (Patient taking differently: Take 40 mg by mouth as needed.) 90 capsule 1   No current facility-administered medications for this visit.    PHYSICAL EXAM: Vitals:   10/25/24 0807  BP: 100/62  Pulse: 91  Resp: 16  SpO2: 98%  Weight: 134 lb 12.8 oz (61.1 kg)  Height: 5' 6 (1.676 m)     Body mass index is 21.76 kg/m.  Wt Readings from Last 3 Encounters:  10/25/24 134 lb 12.8 oz (61.1 kg)  09/26/24 134 lb 2 oz (60.8 kg)  08/22/24 133 lb 9.6 oz (60.6 kg)    General: Well developed, well nourished female in no apparent distress.  HEENT: AT/[place], no external lesions. Hearing intact to the spoken word Eyes: EOMI. No stare, proptosis or lid lag. Conjunctiva clear and no icterus. No erythema or watering Neck: Trachea midline, neck supple without appreciable thyromegaly or lymphadenopathy and no palpable thyroid  nodules Lungs: Clear to auscultation, no wheeze. Respirations not labored Heart: S1S2, Regular in rate and rhythm.  Abdomen: Soft, non tender Neurologic: Alert, oriented, normal speech, deep tendon biceps reflexes normal DTR 2+,  no gross focal neurological deficit Extremities: No pedal pitting edema, no tremors of outstretched hands Skin: Warm, color good.  Psychiatric: Does not appear depressed.SABRA  PERTINENT HISTORIC LABORATORY AND IMAGING STUDIES:  All pertinent laboratory results were reviewed. Please see HPI also for further details.   TSH  Date Value Ref Range Status  08/17/2024 5.56 (H) 0.40 - 4.50 mIU/L Final  05/04/2024 2.29 0.40 - 4.50 mIU/L Final  02/02/2024 0.01 (L) 0.40 - 4.50 mIU/L Final    Lab Results  Component Value Date   FREET4 1.0  08/17/2024   FREET4 1.0 05/04/2024   FREET4 1.1 02/02/2024   T3FREE 2.8 08/17/2024   T3FREE 2.7 05/04/2024   T3FREE 3.2 02/02/2024   TSH 5.56 (H) 08/17/2024   TSH 2.29 05/04/2024   TSH 0.01 (L) 02/02/2024    No results found for: THYROTRECAB  Lab Results  Component Value Date   TSH 5.56 (H) 08/17/2024   TSH 2.29 05/04/2024   TSH 0.01 (L) 02/02/2024   FREET4 1.0 08/17/2024   FREET4 1.0 05/04/2024   FREET4 1.1 02/02/2024     Lab Results  Component Value Date   TSI 313 (H) 10/04/2023     No components found for: TRAB    ASSESSMENT / PLAN  1. Graves disease   2. Hyperthyroidism   3. Thyroid  nodule    -Patient has hyperthyroidism starting in August 2024.  Patient had positive TSI, anti-TPO antibody, thyroglobulin antibody and normal TRAb.  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  Methimazole  was started in January 2025, 5 mg daily.  It was decreased in September 2025. -Patient has been taking methimazole  2.5 mg daily. -Ultrasound thyroid  in July 2025 with ill-defined nodule of the area likely pseudonodules, will consider repeat ultrasound in the future. - Patient is clinically euthyroid today.  Plan: -Follow-up lab results for thyroid  function test collected on December 2.  Talked with the lab and running test this morning.  Results are awaited. -She is currently taking methimazole  2.5 daily. -Will check thyroid  function test TSH, free T4, free T3 prior to follow-up visit in 4 months.  Labs reviewed normal thyroid  function test, continue current dose of methimazole  2.5 mg daily.  No change.   Latest Reference Range & Units 10/23/24 08:08  TSH 0.40 - 4.50 mIU/L 3.26  Triiodothyronine,Free,Serum 2.3 - 4.2 pg/mL 2.6  T4,Free(Direct) 0.8 - 1.8 ng/dL 1.1    Diagnoses and all orders for this visit:  Graves disease -     T4, free -     T3, free -     TSH  Hyperthyroidism  Thyroid  nodule   DISPOSITION Follow up in clinic in 4  months suggested.  Labs prior to  follow-up visit as ordered.  All questions answered and patient verbalized understanding of the plan.  Eshawn Coor, MD [facility] Endocrinology [facility] [address] [place], [address] [place], [place] [address] Phone # [phone]   At least part of this note was generated using voice recognition software. Inadvertent word errors may have occurred, which were not recognized during the proofreading process.

## 2024-11-06 ENCOUNTER — Encounter: Payer: Self-pay | Admitting: Gastroenterology

## 2024-11-07 ENCOUNTER — Other Ambulatory Visit: Payer: Self-pay | Admitting: *Deleted

## 2024-11-07 ENCOUNTER — Ambulatory Visit: Attending: Rheumatology | Admitting: Rheumatology

## 2024-11-07 ENCOUNTER — Encounter: Payer: Self-pay | Admitting: Rheumatology

## 2024-11-07 VITALS — BP 128/82 | HR 65 | Temp 98.4°F | Resp 14 | Ht 66.0 in | Wt 136.4 lb

## 2024-11-07 DIAGNOSIS — G620 Drug-induced polyneuropathy: Secondary | ICD-10-CM | POA: Diagnosis not present

## 2024-11-07 DIAGNOSIS — M1711 Unilateral primary osteoarthritis, right knee: Secondary | ICD-10-CM

## 2024-11-07 DIAGNOSIS — N96 Recurrent pregnancy loss: Secondary | ICD-10-CM | POA: Diagnosis not present

## 2024-11-07 DIAGNOSIS — M546 Pain in thoracic spine: Secondary | ICD-10-CM | POA: Diagnosis not present

## 2024-11-07 DIAGNOSIS — M19071 Primary osteoarthritis, right ankle and foot: Secondary | ICD-10-CM

## 2024-11-07 DIAGNOSIS — M5416 Radiculopathy, lumbar region: Secondary | ICD-10-CM | POA: Diagnosis not present

## 2024-11-07 DIAGNOSIS — B351 Tinea unguium: Secondary | ICD-10-CM

## 2024-11-07 DIAGNOSIS — R7689 Other specified abnormal immunological findings in serum: Secondary | ICD-10-CM

## 2024-11-07 DIAGNOSIS — R5383 Other fatigue: Secondary | ICD-10-CM

## 2024-11-07 DIAGNOSIS — M542 Cervicalgia: Secondary | ICD-10-CM | POA: Diagnosis not present

## 2024-11-07 DIAGNOSIS — K2970 Gastritis, unspecified, without bleeding: Secondary | ICD-10-CM

## 2024-11-07 DIAGNOSIS — M19041 Primary osteoarthritis, right hand: Secondary | ICD-10-CM

## 2024-11-07 DIAGNOSIS — L659 Nonscarring hair loss, unspecified: Secondary | ICD-10-CM | POA: Diagnosis not present

## 2024-11-07 DIAGNOSIS — M19042 Primary osteoarthritis, left hand: Secondary | ICD-10-CM

## 2024-11-07 DIAGNOSIS — C187 Malignant neoplasm of sigmoid colon: Secondary | ICD-10-CM

## 2024-11-07 DIAGNOSIS — E559 Vitamin D deficiency, unspecified: Secondary | ICD-10-CM | POA: Diagnosis not present

## 2024-11-07 DIAGNOSIS — K2990 Gastroduodenitis, unspecified, without bleeding: Secondary | ICD-10-CM

## 2024-11-07 DIAGNOSIS — M19072 Primary osteoarthritis, left ankle and foot: Secondary | ICD-10-CM

## 2024-11-07 DIAGNOSIS — Z9289 Personal history of other medical treatment: Secondary | ICD-10-CM

## 2024-11-07 DIAGNOSIS — D500 Iron deficiency anemia secondary to blood loss (chronic): Secondary | ICD-10-CM

## 2024-11-07 DIAGNOSIS — G8929 Other chronic pain: Secondary | ICD-10-CM

## 2024-11-07 DIAGNOSIS — E785 Hyperlipidemia, unspecified: Secondary | ICD-10-CM

## 2024-11-07 NOTE — Patient Instructions (Addendum)
 Cervical Strain and Sprain Rehab Ask your health care provider which exercises are safe for you. Do exercises exactly as told by your health care provider and adjust them as directed. It is normal to feel mild stretching, pulling, tightness, or discomfort as you do these exercises. Stop right away if you feel sudden pain or your pain gets worse. Do not begin these exercises until told by your health care provider. Stretching and range-of-motion exercises Cervical side bending  Using good posture, sit on a stable chair or stand up. Without moving your shoulders, slowly tilt your left / right ear to your shoulder until you feel a stretch in the neck muscles on the opposite side. You should be looking straight ahead. Hold for __________ seconds. Repeat with the other side of your neck. Repeat __________ times. Complete this exercise __________ times a day. Cervical rotation  Using good posture, sit on a stable chair or stand up. Slowly turn your head to the side as if you are looking over your left / right shoulder. Keep your eyes level with the ground. Stop when you feel a stretch along the side and the back of your neck. Hold for __________ seconds. Repeat this by turning to your other side. Repeat __________ times. Complete this exercise __________ times a day. Thoracic extension and pectoral stretch  Roll a towel or a small blanket so it is about 4 inches (10 cm) in diameter. Lie down on your back on a firm surface. Put the towel in the middle of your back across your spine. It should not be under your shoulder blades. Put your hands behind your head and let your elbows fall out to your sides. Hold for __________ seconds. Repeat __________ times. Complete this exercise __________ times a day. Strengthening exercises Upper cervical flexion  Lie on your back with a thin pillow behind your head or a small, rolled-up towel under your neck. Gently tuck your chin toward your chest and nod  your head down to look toward your feet. Do not lift your head off the pillow. Hold for __________ seconds. Release the tension slowly. Relax your neck muscles completely before you repeat this exercise. Repeat __________ times. Complete this exercise __________ times a day. Cervical extension  Stand about 6 inches (15 cm) away from a wall, with your back facing the wall. Place a soft object, about 6-8 inches (15-20 cm) in diameter, between the back of your head and the wall. A soft object could be a small pillow, a ball, or a folded towel. Gently tilt your head back and press into the soft object. Keep your jaw and forehead relaxed. Hold for __________ seconds. Release the tension slowly. Relax your neck muscles completely before you repeat this exercise. Repeat __________ times. Complete this exercise __________ times a day. Posture and body mechanics Body mechanics refer to the movements and positions of your body while you do your daily activities. Posture is part of body mechanics. Good posture and healthy body mechanics can help to relieve stress in your body's tissues and joints. Good posture means that your spine is in its natural S-curve position (your spine is neutral), your shoulders are pulled back slightly, and your head is not tipped forward. The following are general guidelines for using improved posture and body mechanics in your everyday activities. Sitting  When sitting, keep your spine neutral and keep your feet flat on the floor. Use a footrest, if needed, and keep your thighs parallel to the floor. Avoid rounding  your shoulders. Avoid tilting your head forward. When working at a desk or a computer, keep your desk at a height where your hands are slightly lower than your elbows. Slide your chair under your desk so you are close enough to maintain good posture. When working at a computer, place your monitor at a height where you are looking straight ahead and you do not have to  tilt your head forward or downward to look at the screen. Standing  When standing, keep your spine neutral and keep your feet about hip-width apart. Keep a slight bend in your knees. Your ears, shoulders, and hips should line up. When you do a task in which you stand in one place for a long time, place one foot up on a stable object that is 2-4 inches (5-10 cm) high, such as a footstool. This helps keep your spine neutral. Resting When lying down and resting, avoid positions that are most painful for you. Try to support your neck in a neutral position. You can use a contour pillow or a small rolled-up towel. Your pillow should support your neck but not push on it. This information is not intended to replace advice given to you by your health care provider. Make sure you discuss any questions you have with your health care provider. Document Revised: 03/14/2023 Document Reviewed: 05/31/2022 Elsevier Patient Education  2024 [employer]  Thoracic Strain Rehab Ask your health care provider which exercises are safe for you. Do exercises exactly as told by your provider and adjust them as directed. It is normal to feel mild stretching, pulling, tightness, or discomfort as you do these exercises. Stop right away if you feel sudden pain or your pain gets worse. Do not begin these exercises until told by your provider. Stretching and range-of-motion exercise This exercise warms up your muscles and joints and improves the movement and flexibility of your back and shoulders. This exercise also helps to relieve pain. Chest and spine stretch  Lie down on your back on a firm surface. Roll a towel or a small blanket so it is about 4 inches (10 cm) in diameter. Put the towel under the middle of your back so it is under your spine, but not under your shoulder blades. Put your hands behind your head and let your elbows fall to your sides. This will increase your stretch. Take a deep breath (inhale). Hold for  __________ seconds. Relax after you breathe out (exhale). Repeat __________ times. Complete this exercise __________ times a day. Strengthening exercises These exercises build strength and endurance in your back and your shoulder blade muscles. Endurance is the ability to use your muscles for a long time, even after they get tired. Alternating arm and leg raises  Get on your hands and knees on a firm surface. If you are on a hard floor, you may want to use padding, such as an exercise mat, to cushion your knees. Line up your arms and legs. Your hands should be directly below your shoulders, and your knees should be directly below your hips. Lift your left leg behind you. At the same time, raise your right arm and straighten it in front of you. Do not lift your leg higher than your hip. Do not lift your arm higher than your shoulder. Keep your abdominal and back muscles tight. Keep your hips facing the ground. Do not arch your back. Carefully stay balanced. Do not hold your breath. Hold for __________ seconds. Slowly return to the starting  position and repeat with your right leg and your left arm. Repeat __________ times. Complete this exercise __________ times a day. Straight arm rows This exercise is also called the shoulder extension exercise. Stand with your feet shoulder width apart. Secure an exercise band to a stable object in front of you so the band is at or above shoulder height. Hold one end of the exercise band in each hand. Straighten your elbows and lift your hands up to shoulder height. Step back, away from the secured end of the exercise band, until the band stretches. Squeeze your shoulder blades together and pull your hands down to the sides of your thighs. Stop when your hands are straight down by your sides. This is shoulder extension. Do not let your hands go behind your body. Hold for __________ seconds. Slowly return to the starting position. Repeat __________  times. Complete this exercise __________ times a day. Rowing scapular retraction This is an exercise in which the shoulder blades (scapulae) are pulled toward each other (retraction). Sit in a stable chair without armrests, or stand up. Secure an exercise band to a stable object in front of you so the band is at shoulder height. Hold one end of the exercise band in each hand. Your palms should face toward each other. Bring your arms out straight in front of you. Step back, away from the secured end of the exercise band, until the band stretches. Pull the band backward. As you do this, bend your elbows and squeeze your shoulder blades together, but avoid letting the rest of your body move. Do not shrug your shoulders upward while you do this. Stop when your elbows are at your sides or slightly behind your body. Hold for __________ seconds. Slowly straighten your arms to return to the starting position. Repeat __________ times. Complete this exercise __________ times a day. Posture and body mechanics Good posture and healthy body mechanics can help to relieve stress in your body's tissues and joints. Body mechanics refers to the movements and positions of your body while you do your daily activities. Posture is part of body mechanics. Good posture means: Your spine is in its natural S-curve position (neutral). Your shoulders are pulled back slightly. Your head is not tipped forward. Follow these guidelines to improve your posture and body mechanics in your everyday activities. Standing  When standing, keep your spine neutral and your feet about hip width apart. Keep a slight bend in your knees. Your ears, shoulders, and hips should line up with each other. When you do a task in which you lean forward while standing in one place for a long time, place one foot up on a stable object that is 2-4 inches (5-10 cm) high, such as a footstool. This helps keep your spine neutral. Sitting  When sitting,  keep your spine neutral and keep your feet flat on the floor. Use a footrest if needed. Keep your thighs parallel to the floor. Avoid rounding your shoulders, and avoid tilting your head forward. When working at a desk or a computer, keep your desk at a height where your hands are slightly lower than your elbows. Slide your chair under your desk so you are close enough to maintain good posture. When working at a computer, place your monitor at a height where you are looking straight ahead and you do not have to tilt your head forward or downward to look at the screen. Resting When lying down and resting, avoid positions that are most  painful for you. If you have pain with activities such as sitting, bending, stooping, or squatting (flexion-basedactivities), lie in a position in which your body does not bend very much. For example, avoid curling up on your side with your arms and knees near your chest (fetal position). If you have pain with activities such as standing for a long time or reaching with your arms (extension-basedactivities), lie with your spine in a neutral position and bend your knees slightly. Try the following positions: Lie on your side with a pillow between your knees. Lie on your back with a pillow under your knees.  Lifting  When lifting objects, keep your feet at least shoulder width apart and tighten your abdominal muscles. Bend your knees and hips and keep your spine neutral. It is important to lift using the strength of your legs, not your back. Do not lock your knees straight out. Always ask for help to lift heavy or awkward objects. This information is not intended to replace advice given to you by your health care provider. Make sure you discuss any questions you have with your health care provider. Document Revised: 04/22/2023 Document Reviewed: 06/28/2022 Elsevier Patient Education  2024 [employer].

## 2024-11-07 NOTE — Addendum Note (Signed)
 Addended by: YVONE DAVED BROCKS on: 11/07/2024 01:34 PM   Modules accepted: Orders

## 2024-11-08 ENCOUNTER — Encounter: Admitting: Gastroenterology

## 2024-11-09 ENCOUNTER — Ambulatory Visit: Payer: Self-pay | Admitting: Rheumatology

## 2024-11-09 LAB — PROTEIN / CREATININE RATIO, URINE
Creatinine, Urine: 23 mg/dL (ref 20–275)
Total Protein, Urine: <4 mg/dL — ABNORMAL LOW (ref 5–24)

## 2024-11-09 LAB — ANTI-DNA ANTIBODY, DOUBLE-STRANDED: ds DNA Ab: 8 [IU]/mL — ABNORMAL HIGH

## 2024-11-09 LAB — C3 AND C4
C3 Complement: 104 mg/dL (ref 83–193)
C4 Complement: 25 mg/dL (ref 15–57)

## 2024-11-09 LAB — ANTI-NUCLEAR AB-TITER (ANA TITER): ANA Titer 1: 1:80 {titer} — ABNORMAL HIGH

## 2024-11-09 LAB — SEDIMENTATION RATE: Sed Rate: 6 mm/h (ref 0–30)

## 2024-11-09 LAB — ANA: Anti Nuclear Antibody (ANA): POSITIVE — AB

## 2024-11-09 NOTE — Progress Notes (Signed)
 Double-stranded DNA remains positive, complements normal, sed rate normal, ANA pending, urine protein creatinine ratio normal.  Labs do not indicate an autoimmune disease flare.

## 2024-11-11 NOTE — Progress Notes (Signed)
 ANA remains positive, double-stranded DNA remains positive, urine protein creatinine ratio normal, complements normal, sed rate normal.  Labs do not indicate an autoimmune disease flare.

## 2024-11-12 ENCOUNTER — Encounter: Payer: Self-pay | Admitting: Gastroenterology

## 2024-11-12 ENCOUNTER — Ambulatory Visit: Admitting: Gastroenterology

## 2024-11-12 ENCOUNTER — Ambulatory Visit: Payer: Self-pay | Admitting: Rheumatology

## 2024-11-12 VITALS — BP 120/77 | HR 59 | Temp 98.1°F | Resp 12 | Ht 66.0 in | Wt 134.0 lb

## 2024-11-12 DIAGNOSIS — Z85038 Personal history of other malignant neoplasm of large intestine: Secondary | ICD-10-CM | POA: Diagnosis not present

## 2024-11-12 DIAGNOSIS — R194 Change in bowel habit: Secondary | ICD-10-CM

## 2024-11-12 DIAGNOSIS — K648 Other hemorrhoids: Secondary | ICD-10-CM

## 2024-11-12 DIAGNOSIS — Z980 Intestinal bypass and anastomosis status: Secondary | ICD-10-CM

## 2024-11-12 DIAGNOSIS — K6289 Other specified diseases of anus and rectum: Secondary | ICD-10-CM | POA: Diagnosis present

## 2024-11-12 MED ORDER — SODIUM CHLORIDE 0.9 % IV SOLN: 500.0000 mL | Freq: Once | INTRAVENOUS | Status: DC

## 2024-11-12 NOTE — Progress Notes (Signed)
 [facility] History and Physical   Primary Care Physician:  Hill, Priscilla GAILS, MD   Reason for Procedure:   Change in bowel habits, history of colon cancer  Plan:    colonoscopy     HPI: Priscilla Hill is a 58 y.o. female  here for colonoscopy - constipation, change in bowel habits. HIstory of colon cancer 2014, last exam in 2021.    Otherwise feels well without any cardiopulmonary symptoms.   I have discussed risks / benefits of anesthesia and endoscopic procedure with Priscilla Hill and they wish to proceed with the exams as outlined today.   The patient was provided an opportunity to ask questions and all were answered. The patient agreed with the plan.    Past Medical History:  Diagnosis Date   Anemia    Anxiety    Arthritis    Cancer (HCC)    GERD (gastroesophageal reflux disease)    H. pylori infection    positive IgG serology   History of multiple miscarriages    History of positive PPD    never any symptoms, due to being from [place]? different vaccine   Hyperlipidemia    mild   Hyperthyroidism     Past Surgical History:  Procedure Laterality Date   childbirth   04-17-13   x 2 NVD   ESSURE TUBAL LIGATION     HYSTEROSCOPY WITH RESECTOSCOPE  04-17-13   fibroids removed   LAPAROSCOPIC PARTIAL COLECTOMY N/A 04/24/2013   Procedure: LAPAROSCOPIC ASSISTED SIGMOID COLECTOMY;  Surgeon: Priscilla JINNY Russell, MD;  Location: [facility];  Service: General;  Laterality: N/A;   PORTACATH PLACEMENT Right 05/24/2013   Procedure: INSERTION PORT-A-CATH;  Surgeon: Priscilla JINNY Russell, MD;  Location: [facility];  Service: General;  Laterality: Right;    Prior to Admission medications  Medication Sig Start Date End Date Taking? Authorizing Provider  Biotin 89999 MCG TABS Take by mouth daily.   Yes [provider]  magnesium 30 MG tablet Take by mouth daily.   Yes [provider]  methimazole  (TAPAZOLE ) 5 MG tablet Take 0.5 tablets (2.5 mg total) by mouth  daily. 08/22/24 08/22/25 Yes Hill, Sudan, MD  TURMERIC PO Take by mouth daily.   Yes [provider]  Omega-3 Fatty Acids (OMEGA 3 PO) Take by mouth daily.    [provider]  omeprazole  (PRILOSEC) 40 MG capsule Take 1 capsule (40 mg total) by mouth daily. Patient taking differently: Take 40 mg by mouth as needed. 11/12/21   Hill, Priscilla V, MD    Current Outpatient Medications  Medication Sig Dispense Refill   Biotin 89999 MCG TABS Take by mouth daily.     magnesium 30 MG tablet Take by mouth daily.     methimazole  (TAPAZOLE ) 5 MG tablet Take 0.5 tablets (2.5 mg total) by mouth daily. 45 tablet 3   TURMERIC PO Take by mouth daily.     Omega-3 Fatty Acids (OMEGA 3 PO) Take by mouth daily.     omeprazole  (PRILOSEC) 40 MG capsule Take 1 capsule (40 mg total) by mouth daily. (Patient taking differently: Take 40 mg by mouth as needed.) 90 capsule 1   Current Facility-Administered Medications  Medication Dose Route Frequency Provider Last Rate Last Admin   0.9 %  sodium chloride  infusion  500 mL Intravenous Once Ellis Hill, Priscilla SQUIBB, MD        Allergies as of 11/12/2024   (No Known Allergies)    Family History  Problem Relation Age of  Onset   Hypertension Mother    Diabetes Mother    Heart disease Mother    Stroke Father 75   Hypertension Father    Heart disease Maternal Grandmother    Heart disease Maternal Grandfather    Healthy Daughter    Healthy Daughter    Stomach cancer Other        grandmother   Colon cancer Neg Hx    Rectal cancer Neg Hx    Esophageal cancer Neg Hx     Social History   Socioeconomic History   Marital status: Married    Spouse name: Priscilla Hill   Number of children: 2   Years of education: Not on file   Highest education level: Not on file  Occupational History   Occupation: Dietary Event Organiser: [email or web address]     Comment: [employer]  Tobacco Use   Smoking status: Never    Passive exposure: Never    Smokeless tobacco: Never  Vaping Use   Vaping status: Never Used  Substance and Sexual Activity   Alcohol use: Yes    Alcohol/week: 0.0 standard drinks of alcohol    Comment: occasionally   Drug use: No   Sexual activity: Yes  Other Topics Concern   Not on file  Social History Narrative   Married, husband Priscilla Hill   #1 small 6 yo in home and cares for her 28 year old mother   Both her and husband have #2 grown children each from prior marriage-her's are in [place]   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[place], [place]   Independent in ADL's and able to drive   Speaks English well   Social Drivers of Health   Tobacco Use: Low Risk (11/12/2024)   Patient History    Smoking Tobacco Use: Never    Smokeless Tobacco Use: Never    Passive Exposure: Never  Financial Resource Strain: Not on file  Food Insecurity: Not on file  Transportation Needs: Not on file  Physical Activity: Not on file  Stress: Not on file  Social Connections: Not on file  Intimate Partner Violence: Not on file  Depression (PHQ2-9): Low Risk (07/12/2024)   Depression (PHQ2-9)    PHQ-2 Score: 0  Alcohol Screen: Not on file  Housing: Not on file  Utilities: Not on file  Health Literacy: Not on file    Review of Systems: All other review of systems negative except as mentioned in the HPI.  Physical Exam: Vital signs BP 117/76   Pulse 76   Temp 98.1 F (36.7 C)   Ht 5' 6 (1.676 m)   Wt 134 lb (60.8 kg)   LMP  (LMP Unknown)   SpO2 98%   BMI 21.63 kg/m   General:   Alert,  Well-developed, pleasant and cooperative in NAD Lungs:  Clear throughout to auscultation.   Heart:  Regular rate and rhythm Abdomen:  Soft, nontender and nondistended.   Neuro/Psych:  Alert and cooperative. Normal mood and affect. A and O x 3  Marcey Naval, MD [facility]

## 2024-11-12 NOTE — Progress Notes (Signed)
 Pt's states no medical or surgical changes since previsit or office visit.

## 2024-11-12 NOTE — Progress Notes (Signed)
 Report given to PACU, vss

## 2024-11-12 NOTE — Patient Instructions (Signed)
 YOU HAD AN ENDOSCOPIC PROCEDURE TODAY AT THE [facility]:   Refer to the procedure report that was given to you for any specific questions about what was found during the examination.  If the procedure report does not answer your questions, please call your gastroenterologist to clarify.  If you requested that your care partner not be given the details of your procedure findings, then the procedure report has been included in a sealed envelope for you to review at your convenience later.  YOU SHOULD EXPECT: Some feelings of bloating in the abdomen. Passage of more gas than usual.  Walking can help get rid of the air that was put into your GI tract during the procedure and reduce the bloating. If you had a lower endoscopy (such as a colonoscopy or flexible sigmoidoscopy) you may notice spotting of blood in your stool or on the toilet paper. If you underwent a bowel prep for your procedure, you may not have a normal bowel movement for a few days.  Please Note:  You might notice some irritation and congestion in your nose or some drainage.  This is from the oxygen used during your procedure.  There is no need for concern and it should clear up in a day or so.  SYMPTOMS TO REPORT IMMEDIATELY:  Following lower endoscopy (colonoscopy or flexible sigmoidoscopy):  Excessive amounts of blood in the stool  Significant tenderness or worsening of abdominal pains  Swelling of the abdomen that is new, acute  Fever of 100F or higher  Resume previous diet Continue present medications Repeat colonoscopy in 5 years Handout on hemorrhoids given   For urgent or emergent issues, a gastroenterologist can be reached at any hour by calling [phone]. Do not use MyChart messaging for urgent concerns.    DIET:  We do recommend a small meal at first, but then you may proceed to your regular diet.  Drink plenty of fluids but you should avoid alcoholic beverages for 24 hours.  ACTIVITY:  You should  plan to take it easy for the rest of today and you should NOT DRIVE or use heavy machinery until tomorrow (because of the sedation medicines used during the test).    FOLLOW UP: Our staff will call the number listed on your records the next business day following your procedure.  We will call around 7:15- 8:00 am to check on you and address any questions or concerns that you may have regarding the information given to you following your procedure. If we do not reach you, we will leave a message.     If any biopsies were taken you will be contacted by phone or by letter within the next 1-3 weeks.  Please call us  at [phone] if you have not heard about the biopsies in 3 weeks.    SIGNATURES/CONFIDENTIALITY: You and/or your care partner have signed paperwork which will be entered into your electronic medical record.  These signatures attest to the fact that that the information above on your After Visit Summary has been reviewed and is understood.  Full responsibility of the confidentiality of this discharge information lies with you and/or your care-partner.

## 2024-11-12 NOTE — Op Note (Signed)
 [facility] Patient Name: Priscilla Hill Procedure Date: 11/12/2024 1:46 PM MRN: [id number] Endoscopist: Elspeth P. Leigh , MD, [id number] Age: 58 Referring MD:  Date of Birth: June 26, 1966 Gender: Female Account #: [id number] Procedure:                Colonoscopy Indications:              Change in bowel habits (in the setting of dx                            thyroid  disease), history of colon cancer dx 2014.                            Last exam 2021. Medicines:                Monitored Anesthesia Care Procedure:                Pre-Anesthesia Assessment:                           - Prior to the procedure, a History and Physical                            was performed, and patient medications and                            allergies were reviewed. The patient's tolerance of                            previous anesthesia was also reviewed. The risks                            and benefits of the procedure and the sedation                            options and risks were discussed with the patient.                            All questions were answered, and informed consent                            was obtained. Prior Anticoagulants: The patient has                            taken no anticoagulant or antiplatelet agents. ASA                            Grade Assessment: II - A patient with mild systemic                            disease. After reviewing the risks and benefits,                            the patient was deemed in satisfactory condition to  undergo the procedure.                           After obtaining informed consent, the colonoscope                            was passed under direct vision. Throughout the                            procedure, the patient's blood pressure, pulse, and                            oxygen saturations were monitored continuously. The                            Olympus Scope [id number] was introduced  through the                            anus and advanced to the the cecum, identified by                            appendiceal orifice and ileocecal valve. The                            colonoscopy was performed without difficulty. The                            patient tolerated the procedure well. The quality                            of the bowel preparation was good. The ileocecal                            valve, appendiceal orifice, and rectum were                            photographed. Scope In: 1:50:52 PM Scope Out: 2:08:16 PM Scope Withdrawal Time: 0 hours 14 minutes 49 seconds  Total Procedure Duration: 0 hours 17 minutes 24 seconds  Findings:                 The perianal and digital rectal examinations were                            normal.                           There was evidence of a prior end-to-end                            colo-colonic anastomosis in the recto-sigmoid                            colon. This was patent and was characterized by  healthy appearing mucosa.                           Anal papilla(e) were hypertrophied.                           Internal hemorrhoids were found during                            retroflexion. The hemorrhoids were small.                           The exam was otherwise without abnormality. Complications:            No immediate complications. Estimated blood loss:                            None. Estimated Blood Loss:     Estimated blood loss: none. Impression:               - Patent end-to-end colo-colonic anastomosis,                            characterized by healthy appearing mucosa.                           - Anal papilla(e) were hypertrophied.                           - Internal hemorrhoids.                           - The examination was otherwise normal.                           - No polyps. Recommendation:           - Patient has a contact number available for                             emergencies. The signs and symptoms of potential                            delayed complications were discussed with the                            patient. Return to normal activities tomorrow.                            Written discharge instructions were provided to the                            patient.                           - Resume previous diet.                           - Continue present medications.                           -  Repeat colonoscopy in 5 years for surveillance. Elspeth P. Dashauna Heymann, MD 11/12/2024 2:13:33 PM This report has been signed electronically.

## 2024-11-13 ENCOUNTER — Telehealth: Payer: Self-pay

## 2024-11-13 NOTE — Telephone Encounter (Signed)
" °  Follow up Call-     11/12/2024    1:29 PM  Call back number  Post procedure Call Back phone  # [phone]  Permission to leave phone message Yes     Patient questions:  Do you have a fever, pain , or abdominal swelling? No. Pain Score  0 *  Have you tolerated food without any problems? Yes.    Have you been able to return to your normal activities? Yes.    Do you have any questions about your discharge instructions: Diet   No. Medications  No. Follow up visit  No.  Do you have questions or concerns about your Care? No.  Actions: * If pain score is 4 or above: No action needed, pain <4.   "

## 2024-11-16 ENCOUNTER — Encounter: Payer: Self-pay | Admitting: Internal Medicine

## 2024-11-16 ENCOUNTER — Ambulatory Visit: Admitting: Internal Medicine

## 2024-11-16 ENCOUNTER — Ambulatory Visit

## 2024-11-16 VITALS — BP 126/80 | HR 75 | Temp 98.3°F | Ht 66.0 in | Wt 135.6 lb

## 2024-11-16 DIAGNOSIS — M542 Cervicalgia: Secondary | ICD-10-CM

## 2024-11-16 DIAGNOSIS — M5481 Occipital neuralgia: Secondary | ICD-10-CM

## 2024-11-16 DIAGNOSIS — M1990 Unspecified osteoarthritis, unspecified site: Secondary | ICD-10-CM | POA: Diagnosis not present

## 2024-11-16 DIAGNOSIS — L659 Nonscarring hair loss, unspecified: Secondary | ICD-10-CM | POA: Diagnosis not present

## 2024-11-16 MED ORDER — NABUMETONE 500 MG PO TABS: 500.0000 mg | ORAL_TABLET | Freq: Two times a day (BID) | ORAL | 2 refills | Status: AC | PRN

## 2024-11-16 NOTE — Progress Notes (Addendum)
 "  Subjective:  Patient ID: Priscilla Hill, female    DOB: [birth year]  Age: 58 y.o. MRN: [id number]  CC: Headache (Headache. Intermittent for several months. Radiates to/from both sides of the neck as well)   HPI Priscilla Hill presents for Headache R>L (Headache. Intermittent for several months. Radiates to/from both sides of the neck as well - worse lately)  Outpatient Medications Prior to Visit  Medication Sig Dispense Refill   Biotin 89999 MCG TABS Take by mouth daily.     magnesium 30 MG tablet Take by mouth daily.     methimazole  (TAPAZOLE ) 5 MG tablet Take 0.5 tablets (2.5 mg total) by mouth daily. 45 tablet 3   Omega-3 Fatty Acids (OMEGA 3 PO) Take by mouth daily.     omeprazole  (PRILOSEC) 40 MG capsule Take 1 capsule (40 mg total) by mouth daily. (Patient taking differently: Take 40 mg by mouth as needed.) 90 capsule 1   TURMERIC PO Take by mouth daily.     No facility-administered medications prior to visit.    ROS: Review of Systems  Constitutional:  Negative for activity change, appetite change, chills, fatigue and unexpected weight change.  HENT:  Negative for congestion, mouth sores and sinus pressure.   Eyes:  Negative for visual disturbance.  Respiratory:  Negative for cough and chest tightness.   Gastrointestinal:  Negative for abdominal pain and nausea.  Genitourinary:  Negative for difficulty urinating, frequency and vaginal pain.  Musculoskeletal:  Negative for back pain and gait problem.  Skin:  Negative for pallor and rash.  Neurological:  Negative for dizziness, tremors, weakness, numbness and headaches.  Psychiatric/Behavioral:  Negative for confusion and sleep disturbance.     Objective:  BP 126/80   Pulse 75   Temp 98.3 F (36.8 C)   Ht 5' 6 (1.676 m)   Wt 135 lb 9.6 oz (61.5 kg)   LMP  (LMP Unknown)   SpO2 99%   BMI 21.89 kg/m   BP Readings from Last 3 Encounters:  11/16/24 126/80  11/12/24 120/77  11/07/24 128/82    Wt Readings from Last  3 Encounters:  11/16/24 135 lb 9.6 oz (61.5 kg)  11/12/24 134 lb (60.8 kg)  11/07/24 136 lb 6.4 oz (61.9 kg)    Physical Exam Constitutional:      General: She is not in acute distress.    Appearance: She is well-developed.  HENT:     Head: Normocephalic.     Right Ear: External ear normal.     Left Ear: External ear normal.     Nose: Nose normal.  Eyes:     General:        Right eye: No discharge.        Left eye: No discharge.     Conjunctiva/sclera: Conjunctivae normal.     Pupils: Pupils are equal, round, and reactive to light.  Neck:     Thyroid : No thyromegaly.     Vascular: No JVD.     Trachea: No tracheal deviation.  Cardiovascular:     Rate and Rhythm: Normal rate and regular rhythm.     Heart sounds: Normal heart sounds.  Pulmonary:     Effort: No respiratory distress.     Breath sounds: No stridor. No wheezing.  Abdominal:     General: Bowel sounds are normal. There is no distension.     Palpations: Abdomen is soft. There is no mass.     Tenderness: There is no abdominal tenderness. There  is no guarding or rebound.  Musculoskeletal:        General: No tenderness.     Cervical back: Normal range of motion and neck supple. No rigidity.  Lymphadenopathy:     Cervical: No cervical adenopathy.  Skin:    Findings: No erythema or rash.  Neurological:     Cranial Nerves: No cranial nerve deficit.     Motor: No abnormal muscle tone.     Coordination: Coordination normal.     Deep Tendon Reflexes: Reflexes normal.  Psychiatric:        Behavior: Behavior normal.        Thought Content: Thought content normal.        Judgment: Judgment normal.   Neck palpation is painful, tender with range of motion, primarily over paraspinal muscles Occipital nerve area is tender to palpation, primarily in the right  Lab Results  Component Value Date   WBC 5.0 07/12/2024   HGB 13.4 07/12/2024   HCT 40.4 07/12/2024   PLT 244.0 07/12/2024   GLUCOSE 97 07/12/2024   CHOL 254  (H) 07/12/2024   TRIG 56.0 07/12/2024   HDL 72.80 07/12/2024   LDLCALC 170 (H) 07/12/2024   ALT 20 07/12/2024   AST 21 07/12/2024   NA 142 07/12/2024   K 3.5 07/12/2024   CL 104 07/12/2024   CREATININE 0.77 07/12/2024   BUN 9 07/12/2024   CO2 30 07/12/2024   TSH 3.26 10/23/2024   INR 0.94 05/23/2013   MICROALBUR <0.7 07/12/2024    US  THYROID  Result Date: 12/10/2023 CLINICAL DATA:  Hyperthyroid. EXAM: THYROID  ULTRASOUND TECHNIQUE: Ultrasound examination of the thyroid  gland and adjacent soft tissues was performed. COMPARISON:  None Available. FINDINGS: Parenchymal Echotexture: Moderately heterogenous - no definitive evidence of glandular hyperemia. Isthmus: Normal in size measuring 0.4 cm in diameter Right lobe: Normal in size measuring 4.1 x 1.7 x 1.4 cm Left lobe: Slightly atrophic measuring 3.9 x 1.9 x 1.4 cm _________________________________________________________ Estimated total number of nodules >/= 1 cm: 2 Number of spongiform nodules >/=  2 cm not described below (TR1): 0 Number of mixed cystic and solid nodules >/= 1.5 cm not described below (TR2): 0 _________________________________________________________ Questioned 1.9 x 0.8 x 0.8 cm ill-defined hypoechoic nodule within the inferior medial aspect of the right lobe of the thyroid  (labeled 1) is favored to represent a pseudonodule as it lacks defined borders on both provided transverse and sagittal images and as confirmed on provided cine series 3. _________________________________________________________ There is a punctate (0.5 cm) nodule/pseudonodule within the superior pole of the left lobe of the thyroid  (labeled 2), which does not meet criteria to recommend percutaneous sampling or continued dedicated follow-up. There is a 0.9 x 0.5 x 0.5 cm hypoechoic nodule within the mid, posterior aspect of the left lobe of the thyroid  (labeled 3), which does not meet imaging criteria to recommend percutaneous sampling or continued dedicated  follow-up. Questioned 1.3 x 1.1 x 0.8 cm hypoechoic ill-defined nodule within the inferior pole of the left lobe of the thyroid  (labeled 4) is favored to represent a pseudonodule as it lacks defined borders on both provided transverse and sagittal images, and as confirmed on provided cine series 2. IMPRESSION: 1. Slightly atrophic and moderately heterogeneous appearing thyroid  without worrisome nodule or mass. Findings are nonspecific though could be seen in the setting of a thyroiditis. 2. None of the discretely measured thyroid  nodules/pseudo nodules meet imaging criteria to recommend percutaneous sampling or continued dedicated follow-up. The above is in keeping with  the ACR TI-RADS recommendations - J Am Coll Radiol 2017;14:587-595. Electronically Signed   By: Norleen Roulette M.D.   On: 12/10/2023 11:56    Assessment & Plan:   Problem List Items Addressed This Visit     Alopecia   Positive ANA (antinuclear antibody) - ANA 1: 160 nucleolar speckled.  History of sicca symptoms.  Her ANA has been low titer and stable.  Double-stranded DNA was indeterminate. -She gives history of fatigue, photosensitivity arthralgias and dry eyes.  I will obtain following labs today.  Plan: Protein / creatinine ratio, urine, ANA, Anti-DNA antibody, double-stranded, C3 and C4, Sedimentation rate.  We will contact her once the lab results are available.        Osteoarthritis   Positive ANA (antinuclear antibody) - ANA 1: 160 nucleolar speckled.  History of sicca symptoms.  Her ANA has been low titer and stable.  Double-stranded DNA was indeterminate. -She gives history of fatigue, photosensitivity arthralgias and dry eyes.  I will obtain following labs today.  Plan: Protein / creatinine ratio, urine, ANA, Anti-DNA antibody, double-stranded, C3 and C4, Sedimentation rate.  We will contact her once the lab results are available.        Relevant Medications   nabumetone  (RELAFEN ) 500 MG tablet   Occipital neuralgia of  right side - Primary   Info given [facility] offered Heat Relafen  500 bid prn  Musculoskeletal neck pain due to her job.  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    Dispense:  60 tablet    Refill:  2      Follow-up: Return in about 6 weeks (around 12/28/2024) for a follow-up visit.  Marolyn Noel, MD "

## 2024-11-16 NOTE — Assessment & Plan Note (Signed)
 Positive ANA (antinuclear antibody) - ANA 1: 160 nucleolar speckled.  History of sicca symptoms.  Her ANA has been low titer and stable.  Double-stranded DNA was indeterminate. -She gives history of fatigue, photosensitivity arthralgias and dry eyes.  I will obtain following labs today.  Plan: Protein / creatinine ratio, urine, ANA, Anti-DNA antibody, double-stranded, C3 and C4, Sedimentation rate.  We will contact her once the lab results are available.

## 2024-11-16 NOTE — Assessment & Plan Note (Addendum)
 Info given [facility] offered Heat Relafen  500 bid prn  Musculoskeletal neck pain due to her job.  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

## 2024-11-16 NOTE — Patient Instructions (Signed)
 Recent large-scale observational studies provide strong evidence that the shingles vaccine is associated with a reduced risk of developing dementia. It may also help slow cognitive decline in individuals who already have dementia.  Key Findings from Recent Research Multiple natural experiment studies, which leveraged unique vaccination policies to minimize bias, have consistently found a protective link:  Reduced Risk: One large study, published in [place], analyzed health records from over 280,000 older adults in [place] and found that those who received the live-attenuated shingles vaccine (Zostavax, now discontinued in the [place] ) were approximately 20% less likely to develop dementia over a seven-year follow-up period. Slower Progression: A follow-up study published in Cell indicated that for those already living with dementia, the vaccine appeared to slow the progression of the disease and reduced dementia-related deaths by nearly half over nine years. Vascular Dementia Protection: Other research presented at [facility] 2025 indicated that the vaccine lowered the risk of vascular dementia by 50%. Newer Vaccine: A separate study published in [facility] suggested that the newer, more effective recombinant vaccine (Shingrix, currently used in the [place] ) also offers significant protection against dementia, with an association of lower risk than the older vaccine type. Stronger Effect in Women: Several studies noted that the protective effect against dementia was more pronounced in women than in men, possibly due to differences in immune responses or dementia pathogenesis.  Why Might the Vaccine Help? Researchers are still working to determine the exact mechanism, but current theories center on the idea that preventing the shingles virus (varicella-zoster) from reactivating helps protect the brain:  Reduced Inflammation: The most likely explanation is that the vaccine prevents shingles infections and subsequent  inflammation in the nervous system, which is a known risk factor for neurodegenerative diseases like dementia. Immune System Modulation: It's also possible that the vaccine boosts the immune system more broadly, helping the body combat other processes related to cognitive decline.  Important Considerations Observational Data: While the evidence is strong, these findings primarily come from observational studies, which show an association, not a definitive cause-and-effect relationship. A large-scale randomized controlled trial is the gold standard for conclusive proof, but such trials are difficult to conduct for dementia due to the time and cost involved. Public Health Recommendation: The [place] Centers for Disease Control and Prevention (CDC) already recommends the two-dose Shingrix vaccine for all healthy adults aged 29 and older primarily to prevent shingles and its painful complications. The potential added benefit for dementia prevention provides another compelling reason to get vaccinated.    USEFUL THINGS FOR ARTHRITIS and musculoskeletal pains:    A rice sock heating pad refers to a homemade heating pad created by filling a sock with uncooked rice, which can be heated in a microwave to provide a warm compress for sore muscles, pain relief, or other applications; essentially, it's a simple way to generate heat using readily available materials.  Key points about rice sock heat: How to make it: Fill a clean sock (preferably a tube sock) about 2/3 full with uncooked rice, tie a knot at the top to secure the rice inside.  Heating it up: Place the rice sock in the microwave and heat in short intervals (usually around 30 seconds at a time) until it reaches the desired warmth.  Important considerations: Check temperature before applying: Always test the temperature of the rice sock before applying it to your skin to avoid burns.  Use a towel to protect skin: Wrap the rice sock in a thin towel  to distribute the heat  evenly and protect your skin.  Uses: Muscle aches and pains  Menstrual cramps  Neck pain  Arthritis discomfort   SILICONE PADS: Use them to open jars and bottles    BRIX JAR OPENER: Use them to open jars    NITRILE COATED GARDEN GLOVES: Use down to open jars, lift boxes, etc.   THUMB BRACE: Use it for thumb arthritis flareup pain     BLUE EMU CREAM: Use it 2-3 times a day on painful areas

## 2024-11-19 ENCOUNTER — Ambulatory Visit: Payer: Self-pay | Admitting: Internal Medicine

## 2025-02-26 ENCOUNTER — Other Ambulatory Visit

## 2025-02-28 ENCOUNTER — Ambulatory Visit: Admitting: Endocrinology

## 2025-04-03 ENCOUNTER — Ambulatory Visit: Admitting: Oncology

## 2025-04-03 ENCOUNTER — Other Ambulatory Visit

## 2025-07-16 ENCOUNTER — Encounter: Admitting: Internal Medicine

## 2025-11-07 ENCOUNTER — Ambulatory Visit: Admitting: Rheumatology
